# Patient Record
Sex: Male | Born: 1950 | Race: White | Hispanic: No | Marital: Married | Smoking: Former smoker
Health system: Southern US, Community
[De-identification: ages and names within clinical notes are randomized; demographics above are authoritative.]

## PROBLEM LIST (undated history)

## (undated) DIAGNOSIS — K219 Gastro-esophageal reflux disease without esophagitis: Secondary | ICD-10-CM

## (undated) DIAGNOSIS — F0390 Unspecified dementia without behavioral disturbance: Secondary | ICD-10-CM

## (undated) DIAGNOSIS — I82409 Acute embolism and thrombosis of unspecified deep veins of unspecified lower extremity: Secondary | ICD-10-CM

## (undated) DIAGNOSIS — M1990 Unspecified osteoarthritis, unspecified site: Secondary | ICD-10-CM

## (undated) DIAGNOSIS — G4730 Sleep apnea, unspecified: Secondary | ICD-10-CM

## (undated) DIAGNOSIS — R51 Headache: Secondary | ICD-10-CM

## (undated) DIAGNOSIS — I639 Cerebral infarction, unspecified: Secondary | ICD-10-CM

## (undated) DIAGNOSIS — R001 Bradycardia, unspecified: Secondary | ICD-10-CM

## (undated) DIAGNOSIS — C801 Malignant (primary) neoplasm, unspecified: Secondary | ICD-10-CM

## (undated) DIAGNOSIS — B1920 Unspecified viral hepatitis C without hepatic coma: Secondary | ICD-10-CM

## (undated) HISTORY — PX: APPENDECTOMY: SHX54

## (undated) HISTORY — DX: Sleep apnea, unspecified: G47.30

## (undated) HISTORY — DX: Gastro-esophageal reflux disease without esophagitis: K21.9

## (undated) HISTORY — PX: EYE SURGERY: SHX253

## (undated) HISTORY — DX: Unspecified viral hepatitis C without hepatic coma: B19.20

## (undated) HISTORY — PX: CATARACT EXTRACTION, BILATERAL: SHX1313

## (undated) HISTORY — DX: Cerebral infarction, unspecified: I63.9

## (undated) HISTORY — PX: OTHER SURGICAL HISTORY: SHX169

## (undated) HISTORY — DX: Headache: R51

---

## 1997-10-15 ENCOUNTER — Ambulatory Visit (HOSPITAL_COMMUNITY): Admission: RE | Admit: 1997-10-15 | Discharge: 1997-10-15 | Payer: Self-pay | Admitting: Gastroenterology

## 1999-12-23 ENCOUNTER — Encounter: Payer: Self-pay | Admitting: Family Medicine

## 1999-12-23 ENCOUNTER — Encounter: Admission: RE | Admit: 1999-12-23 | Discharge: 1999-12-23 | Payer: Self-pay | Admitting: Family Medicine

## 2000-05-04 ENCOUNTER — Emergency Department (HOSPITAL_COMMUNITY): Admission: EM | Admit: 2000-05-04 | Discharge: 2000-05-04 | Payer: Self-pay | Admitting: *Deleted

## 2000-08-21 ENCOUNTER — Ambulatory Visit (HOSPITAL_COMMUNITY): Admission: RE | Admit: 2000-08-21 | Discharge: 2000-08-21 | Payer: Self-pay | Admitting: Gastroenterology

## 2001-10-15 ENCOUNTER — Ambulatory Visit (HOSPITAL_COMMUNITY): Admission: RE | Admit: 2001-10-15 | Discharge: 2001-10-15 | Payer: Self-pay | Admitting: Gastroenterology

## 2001-10-15 ENCOUNTER — Encounter (INDEPENDENT_AMBULATORY_CARE_PROVIDER_SITE_OTHER): Payer: Self-pay | Admitting: Specialist

## 2002-12-30 ENCOUNTER — Encounter: Admission: RE | Admit: 2002-12-30 | Discharge: 2002-12-30 | Payer: Self-pay | Admitting: Family Medicine

## 2004-05-05 ENCOUNTER — Encounter: Admission: RE | Admit: 2004-05-05 | Discharge: 2004-05-05 | Payer: Self-pay | Admitting: Family Medicine

## 2004-08-29 ENCOUNTER — Encounter: Admission: RE | Admit: 2004-08-29 | Discharge: 2004-08-29 | Payer: Self-pay | Admitting: Family Medicine

## 2006-10-20 ENCOUNTER — Emergency Department (HOSPITAL_COMMUNITY): Admission: EM | Admit: 2006-10-20 | Discharge: 2006-10-20 | Payer: Self-pay | Admitting: Internal Medicine

## 2008-04-03 ENCOUNTER — Encounter: Admission: RE | Admit: 2008-04-03 | Discharge: 2008-04-03 | Payer: Self-pay | Admitting: Gastroenterology

## 2010-02-06 ENCOUNTER — Encounter: Payer: Self-pay | Admitting: Gastroenterology

## 2011-04-24 ENCOUNTER — Other Ambulatory Visit: Payer: Self-pay | Admitting: Gastroenterology

## 2011-04-24 DIAGNOSIS — B182 Chronic viral hepatitis C: Secondary | ICD-10-CM

## 2011-04-27 ENCOUNTER — Ambulatory Visit
Admission: RE | Admit: 2011-04-27 | Discharge: 2011-04-27 | Disposition: A | Discharge status: Home / Self Care | Payer: Federal, State, Local not specified - PPO | Source: Ambulatory Visit | Attending: Gastroenterology | Admitting: Gastroenterology

## 2011-04-27 DIAGNOSIS — B182 Chronic viral hepatitis C: Secondary | ICD-10-CM

## 2011-08-17 ENCOUNTER — Other Ambulatory Visit: Payer: Self-pay | Admitting: Otolaryngology

## 2012-11-14 ENCOUNTER — Other Ambulatory Visit: Payer: Self-pay | Admitting: Otolaryngology

## 2013-03-11 ENCOUNTER — Ambulatory Visit: Payer: Federal, State, Local not specified - PPO | Admitting: Neurology

## 2013-04-02 ENCOUNTER — Ambulatory Visit: Payer: Federal, State, Local not specified - PPO | Admitting: Neurology

## 2013-04-08 ENCOUNTER — Encounter: Payer: Self-pay | Admitting: Neurology

## 2013-04-08 ENCOUNTER — Ambulatory Visit (INDEPENDENT_AMBULATORY_CARE_PROVIDER_SITE_OTHER): Payer: Federal, State, Local not specified - PPO | Admitting: Neurology

## 2013-04-08 VITALS — BP 128/60 | HR 70 | Temp 98.0°F | Resp 16 | Ht 72.0 in | Wt 165.3 lb

## 2013-04-08 DIAGNOSIS — I69319 Unspecified symptoms and signs involving cognitive functions following cerebral infarction: Secondary | ICD-10-CM

## 2013-04-08 DIAGNOSIS — I69919 Unspecified symptoms and signs involving cognitive functions following unspecified cerebrovascular disease: Secondary | ICD-10-CM

## 2013-04-08 DIAGNOSIS — G3184 Mild cognitive impairment, so stated: Secondary | ICD-10-CM

## 2013-04-08 DIAGNOSIS — R413 Other amnesia: Secondary | ICD-10-CM

## 2013-04-08 NOTE — Patient Instructions (Addendum)
I think you probably do have mild cognitive impairment affecting memory.  I agree with taking the Aricept because there is a much higher risk of developing Alzheimer's.  I think the behavioral problems after the stroke are separate and may be related to the stroke.  In the meantime, continue the aspirin and Zocor.  The LDL (bad cholesterol) should be less than 100.  In the meantime, read, do crossword puzzles and brainteasers, and stay active and social.  Increase exercise.  We will re-evaluate in 6 months.

## 2013-04-08 NOTE — Progress Notes (Signed)
NEUROLOGY CONSULTATION NOTE  Brendan Holland MRN: [id number] DOB: [birth year]  Referring provider: Dr. Glennon Holland Primary care provider: Dr. Glennon Holland  Reason for consult:  Memory problems.  HISTORY OF PRESENT ILLNESS: Brendan Holland is a 63 year old left-handed man with history of alcohol abuse, heroin abuse, stroke, multiple concussions and OSA who presents for memory problems.  Records and images were personally reviewed where available.    In May 2011, he passed out and fell, hitting his head.  He was unconscious for maybe 5 to 7 minutes.  When he woke up, he was disoriented and combative.  CT of the head performed at that time was normal.  Since that time, he suddenly exhibited change in behavior and personality.  He became socially inappropriate.  He would make sexual innuendos to women.  He had an affair a few months after this episode.  He and his wife went for counseling to get through it.  He also is more easily prone to outbursts but he does not physically harm his wife.  He does not exhibit any obsessive compulsive behavior.  Over the past 2 or 3 years, he began to have a progressive steady decline in regards to memory and language.  He repeats questions often and misplaces items.  He even needs several cues from his wife to recall long term memory.  He has increased word-finding difficulties.  He will start to stammer and stutter more often when he speaks.  He understands what people say to him but it may take a little time for him to process everything.  He has had more problems performing typical tasks, such as putting together his power tools.  He is able to perform all his ADLs, but sometimes he doesn't pay attention to hygiene as well as in the past.  For example, he tends to go a day or two too many without shaving.  Or he may wear the same pair of jeans too many days in a row.  He does drive.  He has gotten disoriented on familiar routes before, although not often.  He is not an  aggressive driver and has not gotten into any accidents or near-accidents.  His wife says that she feels safe when she rides with him.  He does not have hallucinations or delusions.  He does feel a little depressed.  He does feel fatigued during the day.  He has OSA but does not use a CPAP machine because the masks don't fit his face well.  He tries to stay active.  He runs 3 to 4 days a week, but used to run 5 days a week.  He goes to [employer] about 4 times a week.  He saw a neurologist, Dr. Trula Holland, in October 2014.  An MRI of the brain was performed on 10/21/12, which revealed a tiny area of encephalomalacia in the parasagittal region of the right frontal lobe.  It was suggested that the syncopal episode in 2011 was the stroke.  He underwent a stroke workup.  LDL was 127.  Carotid dopplers and transcranial doppler were unremarkable.  He was advised to start a statin.  He was already on ASA.  He did have a B12 level checked on 10/15/12, which was 695.  However, a repeat level was checked on 11/07/12, which was 181.  It was thought that the first result was a mistake.  He was started on B12 shots.  For about two weeks, his wife said he seemed like his old  self, but this soon passed.  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  His PCP, Dr. Glennon Holland, started him on Aricept in January 2015.  There is no known family history of dementia.  12/30/02 MRI BRAIN WO:  Normal (personally reviewed) 05/26/09 CT HEAD: normal (personally reviewed) 10/21/12 MRI BRAIN W/WO:  tiny focus of encephalomalacia in parasagittal right frontal lobe (personally reviewed) 11/19/12 Transcranial Doppler:  normal. 11/19/12 Carotid Duplex:  Mild to moderate plaque bilaterally.  No hemodynamically significant stenosis. 02/14/13 LABS:  B12 1756 10/15/12 LABS:  B12 695, folate >20 11/07/12 LABS:  LDL 127, TSH  2.988, B12 181, folate >20, B1 12, B6 58.1 11/20/11 LABS:  LDL 78, TSH 2.620  PAST MEDICAL HISTORY: Past Medical History  Diagnosis Date  . Headache(784.0)   . Hepatitis C   . GERD (gastroesophageal reflux disease)   . Sleep apnea   . Stroke     PAST SURGICAL HISTORY: Past Surgical History  Procedure Laterality Date  . Knee rt    . Lt shoulder    . Rt finger    . Eye surgery    . Appendectomy      MEDICATIONS: ASA 81mg  daily Aricept 5mg  daily Prvacid 15mg  daily Simvastatin 10mg  daily  ALLERGIES: NA  FAMILY HISTORY: Family History  Problem Relation Age of Onset  . Diabetes Father   . Heart failure Mother   . Stroke Brother     SOCIAL HISTORY: History   Social History  . Marital Status: Married    Spouse Name: N/A    Number of Children: N/A  . Years of Education: N/A   Occupational History  . Not on file.   Social History Main Topics  . Smoking status: Former Research scientist (life sciences)  . Smokeless tobacco: Never Used  . Alcohol Use: No  . Drug Use: No  . Sexual Activity: Yes    Partners: Female   Other Topics Concern  . Not on file   Social History Narrative  . No narrative on file    REVIEW OF SYSTEMS: Constitutional: No fevers, chills, or sweats, no generalized fatigue, change in appetite Eyes: No visual changes, double vision, eye pain Ear, nose and throat: No hearing loss, ear pain, nasal congestion, sore throat Cardiovascular: No chest pain, palpitations Respiratory:  No shortness of breath at rest or with exertion, wheezes GastrointestinaI: No nausea, vomiting, diarrhea, abdominal pain, fecal incontinence Genitourinary:  No dysuria, urinary retention or frequency Musculoskeletal:  No neck pain, back pain Integumentary: No rash, pruritus, skin lesions Neurological: as above Psychiatric: No depression, insomnia, anxiety Endocrine: No palpitations, fatigue, diaphoresis, mood swings, change in appetite, change in weight, increased  thirst Hematologic/Lymphatic:  No anemia, purpura, petechiae. Allergic/Immunologic: no itchy/runny eyes, nasal congestion, recent allergic reactions, rashes  PHYSICAL EXAM: Filed Vitals:   04/08/13 1342  BP: 128/60  Pulse: 70  Temp: 98 F (36.7 C)  Resp: 16   General: No acute distress Head:  Normocephalic/atraumatic Neck: supple, no paraspinal tenderness, full range of motion Back: No paraspinal tenderness Heart: regular rate and rhythm Lungs: Clear to auscultation bilaterally. Vascular: No carotid bruits. Neurological Exam: Mental status: alert and oriented to person, place, and time (except date), recent and remote memory intact, fund of knowledge intact, attention and concentration intact, speech fluent and not dysarthric, language intact.  Able to draw Trail Making Test, copy a cube and draw a clock correctly.  Naming fluency correct.  Serial 7 subtraction intact (missed one by one digit).  Unable to recall 5 of 5 words.  Abstraction intact.  MOCA 24/30 Cranial nerves: CN I: not tested CN II: pupils equal, round and reactive to light, visual fields intact, fundi unremarkable, without vessel changes, exudates, hemorrhages or papilledema. CN III, IV, VI:  full range of motion, no nystagmus, no ptosis CN V: facial sensation intact CN VII: upper and lower face symmetric CN VIII: hearing intact CN IX, X: gag intact, uvula midline CN XI: sternocleidomastoid and trapezius muscles intact CN XII: tongue midline Bulk & Tone: normal, no fasciculations. Motor: 5/5 throughout Sensation: temperature and vibration intact. Deep Tendon Reflexes: 2+ throughout, toes down Finger to nose testing: no tremor or dysmetria Heel to shin: no dysmetria Gait: normal station and stride.  Able to turn and walk in tandem. Romberg negative.  IMPRESSION: Constellation of symptoms may be multifactorial. 1.  Amnestic mild cognitive impairment, given the progression of memory problems 2.  Stroke.  The  sudden onset of behavioral symptoms and disinhibition may possibly be due to a frontal lobe stroke, given that these symptoms were sudden onset and following the episode of syncope.  Such behavior, as well as language problems and executive dysfunction, may also suggest frontotemporal dementia, however that wouldn't come on suddenly and it wouldn't explain the memory problems  PLAN: 1.  I would increase Aricept to 10mg  daily 2.  Continue the ASA 81mg  daily and statin (LDL goal should be less than 100). 3.  Advised to continue exercising and remain active and social.  Perform brainteasers and puzzles.  Read. 4.  Follow up in 6 months or as needed.  60 minutes spent with patient, over 50% spent reviewing outside chart, images, counseling and coordinating care.  Thank you for allowing me to take part in the care of this patient.  Metta Clines, DO  CC:  [name], MD

## 2013-04-09 ENCOUNTER — Other Ambulatory Visit: Payer: Self-pay | Admitting: *Deleted

## 2013-04-09 ENCOUNTER — Telehealth: Payer: Self-pay | Admitting: *Deleted

## 2013-04-09 NOTE — Telephone Encounter (Signed)
Aricept 10 mg 1 at bedtime # 30 called in to [facility]  [place] with 3 refills  Patient is aware

## 2013-05-16 ENCOUNTER — Encounter: Payer: Self-pay | Admitting: Neurology

## 2013-07-08 ENCOUNTER — Other Ambulatory Visit: Payer: Self-pay | Admitting: *Deleted

## 2013-07-08 NOTE — Telephone Encounter (Signed)
Donepezil 10 mg 1 PO at HS # 30 with 2 refills  Called to [facility]  [place]

## 2013-09-30 ENCOUNTER — Telehealth: Payer: Self-pay | Admitting: Neurology

## 2013-09-30 NOTE — Telephone Encounter (Signed)
Pt called to cancel his f/u on 10/07/13 due to him seeing another provider.

## 2013-10-07 ENCOUNTER — Ambulatory Visit: Payer: Federal, State, Local not specified - PPO | Admitting: Neurology

## 2013-10-13 ENCOUNTER — Other Ambulatory Visit: Payer: Self-pay | Admitting: *Deleted

## 2013-10-13 DIAGNOSIS — G309 Alzheimer's disease, unspecified: Principal | ICD-10-CM

## 2013-10-13 DIAGNOSIS — F0280 Dementia in other diseases classified elsewhere without behavioral disturbance: Secondary | ICD-10-CM

## 2013-10-13 MED ORDER — DONEPEZIL HCL 10 MG PO TABS: 10.0000 mg | ORAL_TABLET | Freq: Every day | ORAL | Status: DC

## 2013-10-31 ENCOUNTER — Other Ambulatory Visit: Payer: Self-pay

## 2014-02-19 ENCOUNTER — Telehealth: Payer: Self-pay | Admitting: *Deleted

## 2014-02-19 NOTE — Telephone Encounter (Signed)
DONEPEZIL HCL 10 MG TABLET # 90 with 1 refill called to pharmacy

## 2014-04-10 ENCOUNTER — Other Ambulatory Visit: Payer: Self-pay | Admitting: Neurology

## 2014-04-14 ENCOUNTER — Other Ambulatory Visit: Payer: Self-pay | Admitting: Neurology

## 2014-07-13 ENCOUNTER — Other Ambulatory Visit: Payer: Self-pay

## 2014-07-16 ENCOUNTER — Other Ambulatory Visit: Payer: Self-pay | Admitting: Gastroenterology

## 2014-07-16 DIAGNOSIS — B1920 Unspecified viral hepatitis C without hepatic coma: Secondary | ICD-10-CM

## 2014-07-16 DIAGNOSIS — C220 Liver cell carcinoma: Secondary | ICD-10-CM

## 2014-07-27 ENCOUNTER — Ambulatory Visit
Admission: RE | Admit: 2014-07-27 | Discharge: 2014-07-27 | Disposition: A | Discharge status: Home / Self Care | Payer: Federal, State, Local not specified - PPO | Source: Ambulatory Visit | Attending: Gastroenterology | Admitting: Gastroenterology

## 2014-07-27 DIAGNOSIS — B1920 Unspecified viral hepatitis C without hepatic coma: Secondary | ICD-10-CM

## 2014-07-27 DIAGNOSIS — C220 Liver cell carcinoma: Secondary | ICD-10-CM

## 2014-10-23 ENCOUNTER — Other Ambulatory Visit: Payer: Self-pay | Admitting: Neurology

## 2014-10-23 NOTE — Telephone Encounter (Signed)
I have not seen this patient in a year and a half.  He should get refills from his PCP until he is able to follow up with me.

## 2014-11-16 ENCOUNTER — Other Ambulatory Visit: Payer: Self-pay | Admitting: Neurology

## 2014-11-16 DIAGNOSIS — G3184 Mild cognitive impairment, so stated: Secondary | ICD-10-CM

## 2015-08-05 ENCOUNTER — Other Ambulatory Visit: Payer: Self-pay | Admitting: Neurology

## 2015-08-05 NOTE — Telephone Encounter (Signed)
Rx refused.  Patient needs an appointment.

## 2015-09-09 ENCOUNTER — Ambulatory Visit: Payer: Medicare Other | Admitting: Sports Medicine

## 2015-10-14 ENCOUNTER — Other Ambulatory Visit: Payer: Self-pay | Admitting: Gastroenterology

## 2015-10-14 DIAGNOSIS — B1920 Unspecified viral hepatitis C without hepatic coma: Secondary | ICD-10-CM

## 2015-11-01 ENCOUNTER — Ambulatory Visit
Admission: RE | Admit: 2015-11-01 | Discharge: 2015-11-01 | Disposition: A | Discharge status: Home / Self Care | Payer: Medicare Other | Source: Ambulatory Visit | Attending: Gastroenterology | Admitting: Gastroenterology

## 2015-11-01 DIAGNOSIS — B1920 Unspecified viral hepatitis C without hepatic coma: Secondary | ICD-10-CM

## 2015-11-12 ENCOUNTER — Ambulatory Visit: Payer: Self-pay | Admitting: Surgery

## 2015-11-12 NOTE — H&P (Signed)
Brendan Holland 11/12/2015 3:44 PM Location: [facility] Patient #: [id number] DOB: Feb 18, 1950 Married / Language: Brendan Holland / Race: White Male  History of Present Illness (Zarayah Lanting A. Kae Heller MD; 11/12/2015 3:59 PM) Patient words: This is a very pleasant 65yo man who is here today with his wife for evaluation for laparoscopic cholecystectomy. He has a history of HCV (treated, undetectable) and has undergone annual [place] surveillance. He has not had any liver lesions; last liver bx was 2008 with G2/stage 1 fibrosis. He did have stones up to 1.9cm as of 2016, but on this year's [place] there is a solid mass with incr color flow on doppler, measruing 1.7x1.3cm and concerning for neoplasm. CBD 73mm. He is completely asymptomatic- no nausea, vomiting, change in appetite, weight loss, change in bowel function, denies melena/hematochezia or abdominal pain. No shortness of breath or chest pain. Recent labs are unremarkable (WBC 7.2, hgb 14.4, plt 130, cr 1.09, bicarb 25, na/k/cl normal, tbili 1.1, ast/alt 29/21, alkphos 50, alb 4.2, AFP 3.1). Last c-scope normal, 2012. Last EGD 2011- negative.  In fact, he runs about 7 miles four times per week (at a minimum, per his wife). No tobacco or Etoh/drug use currently but remote hx of etoh abuse and heroine addiction. Veteran, former Tour manager.  The patient is a 65 year old male.   Other Problems Nance Pear, [place]; 11/12/2015 3:44 PM) Cerebrovascular Accident Cholelithiasis Cirrhosis Of Liver Gastroesophageal Reflux Disease Sleep Apnea  Past Surgical History Nance Pear, [place]; 11/12/2015 3:44 PM) Appendectomy Knee Surgery Right. Shoulder Surgery Right.  Diagnostic Studies History Nance Pear, [place]; 11/12/2015 3:44 PM) Colonoscopy 1-5 years ago  Allergies Nance Pear, [place]; 11/12/2015 3:45 PM) No Known Drug Allergies 11/12/2015  Medication History Nance Pear, [place]; 11/12/2015 3:47 PM) Donepezil HCl (10MG  Tablet, Oral)  Active. Fluad (0.5ML Susp Pref Syr, Intramuscular) Active. RaNITidine HCl (150MG  Tablet, Oral) Active. Simvastatin (20MG  Tablet, Oral) Active. Aspirin (81MG  Tablet DR, Oral) Active. Medications Reconciled  Social History Nance Pear, [place]; 11/12/2015 3:44 PM) Alcohol use Remotely quit alcohol use. Caffeine use Coffee, Tea. Illicit drug use Remotely quit drug use. Tobacco use Former smoker.  Family History Nance Pear, [place]; 11/12/2015 3:44 PM) Alcohol Abuse Brother, Daughter, Father. Diabetes Mellitus Father. Heart Disease Mother. Hypertension Mother. Respiratory Condition Mother.     Review of Systems Nance Pear CMA; 11/12/2015 3:44 PM) General Not Present- Appetite Loss, Chills, Fatigue, Fever, Night Sweats, Weight Gain and Weight Loss. Skin Not Present- Change in Wart/Mole, Dryness, Hives, Jaundice, New Lesions, Non-Healing Wounds, Rash and Ulcer. HEENT Not Present- Earache, Hearing Loss, Hoarseness, Nose Bleed, Oral Ulcers, Ringing in the Ears, Seasonal Allergies, Sinus Pain, Sore Throat, Visual Disturbances, Wears glasses/contact lenses and Yellow Eyes. Respiratory Not Present- Bloody sputum, Chronic Cough, Difficulty Breathing, Snoring and Wheezing. Cardiovascular Present- Leg Cramps. Not Present- Chest Pain, Difficulty Breathing Lying Down, Palpitations, Rapid Heart Rate, Shortness of Breath and Swelling of Extremities. Gastrointestinal Not Present- Abdominal Pain, Bloating, Bloody Stool, Change in Bowel Habits, Chronic diarrhea, Constipation, Difficulty Swallowing, Excessive gas, Gets full quickly at meals, Hemorrhoids, Indigestion, Nausea, Rectal Pain and Vomiting. Male Genitourinary Not Present- Blood in Urine, Change in Urinary Stream, Frequency, Impotence, Nocturia, Painful Urination, Urgency and Urine Leakage.  Vitals Bary Castilla Bradford CMA; 11/12/2015 3:48 PM) 11/12/2015 3:47 PM Weight: 165 lb Height: 72in Body Surface Area: 1.96 m Body Mass  Index: 22.38 kg/m  Temp.: 98.5F  Pulse: 82 (Regular)  BP: 122/92 (Sitting, Left Arm, Standard)      Physical Exam (Czar Ysaguirre A. Kae Heller  MD; 11/12/2015 4:00 PM)  General Mental Status-Alert. General Appearance-Consistent with stated age. Hydration-Well hydrated. Voice-Normal.  Head and Neck Head-normocephalic, atraumatic with no lesions or palpable masses. Trachea-midline. Thyroid Gland Characteristics - normal size and consistency.  Eye Eyeball - Bilateral-Extraocular movements intact. Sclera/Conjunctiva - Bilateral-No scleral icterus.  Chest and Lung Exam Chest and lung exam reveals -quiet, even and easy respiratory effort with no use of accessory muscles and on auscultation, normal breath sounds, no adventitious sounds and normal vocal resonance. Inspection Chest Wall - Normal. Back - normal.  Cardiovascular Cardiovascular examination reveals -normal heart sounds, regular rate and rhythm with no murmurs and normal pedal pulses bilaterally.  Abdomen Inspection Inspection of the abdomen reveals - No Hernias. Palpation/Percussion Palpation and Percussion of the abdomen reveal - Soft, Non Tender, No Rebound tenderness, No Rigidity (guarding) and No hepatosplenomegaly. Auscultation Auscultation of the abdomen reveals - Bowel sounds normal.  Neurologic Neurologic evaluation reveals -alert and oriented x 3 with no impairment of recent or remote memory. Mental Status-Normal.  Musculoskeletal Global Assessment -Note:no gross deformities.  Normal Exam - Left-Upper Extremity Strength Normal and Lower Extremity Strength Normal. Normal Exam - Right-Upper Extremity Strength Normal and Lower Extremity Strength Normal.  Lymphatic Head & Neck  General Head & Neck Lymphatics: Bilateral - Description - Normal. Axillary  General Axillary Region: Bilateral - Description - Normal. Tenderness - Non Tender. Femoral & Inguinal  Generalized  Femoral & Inguinal Lymphatics: Bilateral - Description - No Generalized lymphadenopathy.    Assessment & Plan (Edie Darley A. Kae Heller MD; 11/12/2015 4:02 PM)  GALLBLADDER MASS (Principal Diagnosis) (K82.8) Story: Asymptomatic but given size it needs cholecystectomy for evaluation for malignancy. Will plan laparoscopic cholecystectomy, possible liver biopsy. We discussed the risks/benefits, specifically pain, bleeding, scarring, infection, injury to adjacent structures specifically the common bile duct and sequelae of that complication. We discussed the possibility that malignancy may be found in depending on the depths, he may require further surgical intervention by hepatobiliary surgeon. He is scheduled for colonoscopy on November 7, and we'll plan to schedule his gallbladder surgery after this.

## 2015-11-18 ENCOUNTER — Other Ambulatory Visit: Payer: Self-pay | Admitting: Surgery

## 2015-11-18 DIAGNOSIS — K828 Other specified diseases of gallbladder: Secondary | ICD-10-CM

## 2015-11-29 ENCOUNTER — Ambulatory Visit
Admission: RE | Admit: 2015-11-29 | Discharge: 2015-11-29 | Disposition: A | Discharge status: Home / Self Care | Payer: Medicare Other | Source: Ambulatory Visit | Attending: Surgery | Admitting: Surgery

## 2015-11-29 DIAGNOSIS — K828 Other specified diseases of gallbladder: Secondary | ICD-10-CM

## 2015-11-29 MED ORDER — GADOBENATE DIMEGLUMINE 529 MG/ML IV SOLN
15.0000 mL | Freq: Once | INTRAVENOUS | Status: AC | PRN
  Administered 2015-11-29: 15 mL via INTRAVENOUS

## 2015-12-20 ENCOUNTER — Encounter (HOSPITAL_COMMUNITY): Payer: Self-pay

## 2015-12-20 ENCOUNTER — Encounter (HOSPITAL_COMMUNITY)
Admission: RE | Admit: 2015-12-20 | Discharge: 2015-12-20 | Disposition: A | Discharge status: Home / Self Care | Payer: Medicare Other | Source: Ambulatory Visit | Attending: Surgery | Admitting: Surgery

## 2015-12-20 ENCOUNTER — Ambulatory Visit (HOSPITAL_COMMUNITY)
Admission: RE | Admit: 2015-12-20 | Discharge: 2015-12-20 | Disposition: A | Discharge status: Home / Self Care | Payer: Medicare Other | Source: Ambulatory Visit | Attending: Surgery | Admitting: Surgery

## 2015-12-20 DIAGNOSIS — Z01810 Encounter for preprocedural cardiovascular examination: Secondary | ICD-10-CM | POA: Insufficient documentation

## 2015-12-20 DIAGNOSIS — Z01818 Encounter for other preprocedural examination: Secondary | ICD-10-CM

## 2015-12-20 DIAGNOSIS — J449 Chronic obstructive pulmonary disease, unspecified: Secondary | ICD-10-CM | POA: Insufficient documentation

## 2015-12-20 DIAGNOSIS — Z01812 Encounter for preprocedural laboratory examination: Secondary | ICD-10-CM | POA: Diagnosis present

## 2015-12-20 HISTORY — DX: Unspecified dementia, unspecified severity, without behavioral disturbance, psychotic disturbance, mood disturbance, and anxiety: F03.90

## 2015-12-20 HISTORY — DX: Malignant (primary) neoplasm, unspecified: C80.1

## 2015-12-20 HISTORY — DX: Bradycardia, unspecified: R00.1

## 2015-12-20 HISTORY — DX: Unspecified osteoarthritis, unspecified site: M19.90

## 2015-12-20 LAB — COMPREHENSIVE METABOLIC PANEL
ALBUMIN: 4.3 g/dL (ref 3.5–5.0)
ALK PHOS: 50 U/L (ref 38–126)
ALT: 22 U/L (ref 17–63)
ANION GAP: 5 (ref 5–15)
AST: 29 U/L (ref 15–41)
BUN: 15 mg/dL (ref 6–20)
CO2: 31 mmol/L (ref 22–32)
Calcium: 9.2 mg/dL (ref 8.9–10.3)
Chloride: 103 mmol/L (ref 101–111)
Creatinine, Ser: 0.97 mg/dL (ref 0.61–1.24)
GFR calc Af Amer: >60 mL/min (ref >60)
GFR calc non Af Amer: >60 mL/min (ref >60)
GLUCOSE: 100 mg/dL — AB (ref 65–99)
POTASSIUM: 4.4 mmol/L (ref 3.5–5.1)
SODIUM: 139 mmol/L (ref 135–145)
Total Bilirubin: 1.3 mg/dL — ABNORMAL HIGH (ref 0.3–1.2)
Total Protein: 7.3 g/dL (ref 6.5–8.1)

## 2015-12-20 LAB — CBC
HEMATOCRIT: 41.4 % (ref 39.0–52.0)
HEMOGLOBIN: 14.4 g/dL (ref 13.0–17.0)
MCH: 33 pg (ref 26.0–34.0)
MCHC: 34.8 g/dL (ref 30.0–36.0)
MCV: 95 fL (ref 78.0–100.0)
Platelets: 128 10*3/uL — ABNORMAL LOW (ref 150–400)
RBC: 4.36 MIL/uL (ref 4.22–5.81)
RDW: 12.4 % (ref 11.5–15.5)
WBC: 7.4 10*3/uL (ref 4.0–10.5)

## 2015-12-20 NOTE — Patient Instructions (Addendum)
Brendan Holland  12/20/2015   Your procedure is scheduled on: 12-24-15  Report to [facility]  Entrance take [facility]  elevators to 3rd floor to  [place] at  0800  AM.  Call this number if you have problems the morning of surgery [phone]   Remember: ONLY 1 PERSON MAY GO WITH YOU TO SHORT STAY TO GET  READY MORNING OF [name].  Do not eat food or drink liquids :After Midnight.     Take these medicines the morning of surgery with A SIP OF WATER: Ranitidine. DO NOT TAKE ANY DIABETIC MEDICATIONS DAY OF YOUR SURGERY                               You may not have any metal on your body including hair pins and              piercings  Do not wear jewelry, make-up, lotions, powders or perfumes, deodorant             Do not wear nail polish.  Do not shave  48 hours prior to surgery.              Men may shave face and neck.   Do not bring valuables to the hospital. [name].  Contacts, dentures or bridgework may not be worn into surgery.  Leave suitcase in the car. After surgery it may be brought to your room.     Patients discharged the day of surgery will not be allowed to drive home.  Name and phone number of your driver:[name] [phone] cell  Special Instructions: N/A              Please read over the following fact sheets you were given: _____________________________________________________________________             [facility] - Preparing for Surgery Before surgery, you can play an important role.  Because skin is not sterile, your skin needs to be as free of germs as possible.  You can reduce the number of germs on your skin by washing with CHG (chlorahexidine gluconate) soap before surgery.  CHG is an antiseptic cleaner which kills germs and bonds with the skin to continue killing germs even after washing. Please DO NOT use if you have an allergy to CHG or antibacterial soaps.   If your skin becomes reddened/irritated stop using the CHG and inform your nurse when you arrive at Short Stay. Do not shave (including legs and underarms) for at least 48 hours prior to the first CHG shower.  You may shave your face/neck. Please follow these instructions carefully:  1.  Shower with CHG Soap the night before surgery and the  morning of Surgery.  2.  If you choose to wash your hair, wash your hair first as usual with your  normal  shampoo.  3.  After you shampoo, rinse your hair and body thoroughly to remove the  shampoo.                           4.  Use CHG as you would any other liquid soap.  You can apply chg directly  to the  skin and wash                       Gently with a scrungie or clean washcloth.  5.  Apply the CHG Soap to your body ONLY FROM THE NECK DOWN.   Do not use on face/ open                           Wound or open sores. Avoid contact with eyes, ears mouth and genitals (private parts).                       Wash face,  Genitals (private parts) with your normal soap.             6.  Wash thoroughly, paying special attention to the area where your surgery  will be performed.  7.  Thoroughly rinse your body with warm water from the neck down.  8.  DO NOT shower/wash with your normal soap after using and rinsing off  the CHG Soap.                9.  Pat yourself dry with a clean towel.            10.  Wear clean pajamas.            11.  Place clean sheets on your bed the night of your first shower and do not  sleep with pets. Day of Surgery : Do not apply any lotions/deodorants the morning of surgery.  Please wear clean clothes to the hospital/surgery center.  FAILURE TO FOLLOW THESE INSTRUCTIONS MAY RESULT IN THE CANCELLATION OF YOUR SURGERY PATIENT SIGNATURE_________________________________  NURSE SIGNATURE__________________________________  ________________________________________________________________________

## 2015-12-20 NOTE — Pre-Procedure Instructions (Signed)
EKG, CXR done per MD order.

## 2015-12-24 ENCOUNTER — Ambulatory Visit (HOSPITAL_COMMUNITY)
Admission: RE | Admit: 2015-12-24 | Discharge: 2015-12-24 | Disposition: A | Discharge status: Home / Self Care | Payer: Medicare Other | Source: Ambulatory Visit | Attending: Surgery | Admitting: Surgery

## 2015-12-24 ENCOUNTER — Encounter (HOSPITAL_COMMUNITY)
Admission: RE | Disposition: A | Discharge status: Home / Self Care | Payer: Self-pay | Source: Ambulatory Visit | Attending: Surgery

## 2015-12-24 ENCOUNTER — Encounter (HOSPITAL_COMMUNITY): Payer: Self-pay | Admitting: *Deleted

## 2015-12-24 ENCOUNTER — Ambulatory Visit (HOSPITAL_COMMUNITY): Payer: Medicare Other | Admitting: Anesthesiology

## 2015-12-24 DIAGNOSIS — M1990 Unspecified osteoarthritis, unspecified site: Secondary | ICD-10-CM | POA: Insufficient documentation

## 2015-12-24 DIAGNOSIS — K8010 Calculus of gallbladder with chronic cholecystitis without obstruction: Secondary | ICD-10-CM | POA: Diagnosis not present

## 2015-12-24 DIAGNOSIS — G4730 Sleep apnea, unspecified: Secondary | ICD-10-CM | POA: Insufficient documentation

## 2015-12-24 DIAGNOSIS — B1920 Unspecified viral hepatitis C without hepatic coma: Secondary | ICD-10-CM | POA: Diagnosis not present

## 2015-12-24 DIAGNOSIS — K219 Gastro-esophageal reflux disease without esophagitis: Secondary | ICD-10-CM | POA: Insufficient documentation

## 2015-12-24 DIAGNOSIS — Z87891 Personal history of nicotine dependence: Secondary | ICD-10-CM | POA: Insufficient documentation

## 2015-12-24 DIAGNOSIS — K8020 Calculus of gallbladder without cholecystitis without obstruction: Secondary | ICD-10-CM | POA: Diagnosis present

## 2015-12-24 DIAGNOSIS — Z7982 Long term (current) use of aspirin: Secondary | ICD-10-CM | POA: Diagnosis not present

## 2015-12-24 DIAGNOSIS — K7460 Unspecified cirrhosis of liver: Secondary | ICD-10-CM | POA: Insufficient documentation

## 2015-12-24 DIAGNOSIS — Z8673 Personal history of transient ischemic attack (TIA), and cerebral infarction without residual deficits: Secondary | ICD-10-CM | POA: Diagnosis not present

## 2015-12-24 HISTORY — PX: CHOLECYSTECTOMY: SHX55

## 2015-12-24 SURGERY — LAPAROSCOPIC CHOLECYSTECTOMY
Anesthesia: General | Site: Abdomen

## 2015-12-24 MED ORDER — CEFAZOLIN SODIUM-DEXTROSE 2-4 GM/100ML-% IV SOLN
2.0000 g | INTRAVENOUS | Status: AC
  Administered 2015-12-24: 2 g via INTRAVENOUS

## 2015-12-24 MED ORDER — HYDROCODONE-ACETAMINOPHEN 5-325 MG PO TABS: 1.0000 | ORAL_TABLET | Freq: Four times a day (QID) | ORAL | 0 refills | Status: DC | PRN

## 2015-12-24 MED ORDER — OXYCODONE HCL 5 MG PO TABS
5.0000 mg | ORAL_TABLET | ORAL | Status: DC | PRN
  Administered 2015-12-24 (×2): 5 mg via ORAL
  Filled 2015-12-24 (×2): qty 1

## 2015-12-24 MED ORDER — HYDROMORPHONE HCL 1 MG/ML IJ SOLN
0.2500 mg | INTRAMUSCULAR | Status: DC | PRN
  Administered 2015-12-24 (×2): 0.5 mg via INTRAVENOUS

## 2015-12-24 MED ORDER — LIDOCAINE HCL (CARDIAC) 20 MG/ML IV SOLN
INTRAVENOUS | Status: DC | PRN
  Administered 2015-12-24: 50 mg via INTRAVENOUS

## 2015-12-24 MED ORDER — ROCURONIUM BROMIDE 100 MG/10ML IV SOLN
INTRAVENOUS | Status: DC | PRN
  Administered 2015-12-24 (×3): 10 mg via INTRAVENOUS
  Administered 2015-12-24: 40 mg via INTRAVENOUS
  Administered 2015-12-24: 20 mg via INTRAVENOUS

## 2015-12-24 MED ORDER — SUGAMMADEX SODIUM 200 MG/2ML IV SOLN
INTRAVENOUS | Status: AC
  Filled 2015-12-24: qty 2

## 2015-12-24 MED ORDER — FENTANYL CITRATE (PF) 100 MCG/2ML IJ SOLN
INTRAMUSCULAR | Status: AC
  Filled 2015-12-24: qty 2

## 2015-12-24 MED ORDER — CEFAZOLIN SODIUM-DEXTROSE 2-4 GM/100ML-% IV SOLN
INTRAVENOUS | Status: AC
  Filled 2015-12-24: qty 100

## 2015-12-24 MED ORDER — FENTANYL CITRATE (PF) 100 MCG/2ML IJ SOLN: 25.0000 ug | INTRAMUSCULAR | Status: DC | PRN

## 2015-12-24 MED ORDER — GLYCOPYRROLATE 0.2 MG/ML IV SOSY
PREFILLED_SYRINGE | INTRAVENOUS | Status: AC
  Filled 2015-12-24: qty 3

## 2015-12-24 MED ORDER — SODIUM CHLORIDE 0.9 % IV SOLN: 250.0000 mL | INTRAVENOUS | Status: DC | PRN

## 2015-12-24 MED ORDER — ROCURONIUM BROMIDE 50 MG/5ML IV SOSY
PREFILLED_SYRINGE | INTRAVENOUS | Status: AC
Start: 2015-12-24 — End: 2015-12-24
  Filled 2015-12-24: qty 5

## 2015-12-24 MED ORDER — CEFAZOLIN SODIUM-DEXTROSE 2-4 GM/100ML-% IV SOLN: 2.0000 g | INTRAVENOUS | Status: DC

## 2015-12-24 MED ORDER — ONDANSETRON HCL 4 MG/2ML IJ SOLN
INTRAMUSCULAR | Status: AC
Start: 2015-12-24 — End: 2015-12-24
  Filled 2015-12-24: qty 2

## 2015-12-24 MED ORDER — GLYCOPYRROLATE 0.2 MG/ML IJ SOLN
INTRAMUSCULAR | Status: DC | PRN
  Administered 2015-12-24: 0.2 mg via INTRAVENOUS
  Administered 2015-12-24: 0.1 mg via INTRAVENOUS

## 2015-12-24 MED ORDER — MIDAZOLAM HCL 2 MG/2ML IJ SOLN
INTRAMUSCULAR | Status: AC
  Filled 2015-12-24: qty 2

## 2015-12-24 MED ORDER — DOCUSATE SODIUM 100 MG PO CAPS: 100.0000 mg | ORAL_CAPSULE | Freq: Two times a day (BID) | ORAL | 0 refills | Status: AC

## 2015-12-24 MED ORDER — CHLORHEXIDINE GLUCONATE CLOTH 2 % EX PADS: 6.0000 | MEDICATED_PAD | Freq: Once | CUTANEOUS | Status: DC

## 2015-12-24 MED ORDER — ROCURONIUM BROMIDE 50 MG/5ML IV SOSY
PREFILLED_SYRINGE | INTRAVENOUS | Status: AC
  Filled 2015-12-24: qty 5

## 2015-12-24 MED ORDER — MIDAZOLAM HCL 5 MG/5ML IJ SOLN
INTRAMUSCULAR | Status: DC | PRN
  Administered 2015-12-24: 2 mg via INTRAVENOUS

## 2015-12-24 MED ORDER — 0.9 % SODIUM CHLORIDE (POUR BTL) OPTIME
TOPICAL | Status: DC | PRN
  Administered 2015-12-24: 1000 mL

## 2015-12-24 MED ORDER — BUPIVACAINE HCL (PF) 0.25 % IJ SOLN
INTRAMUSCULAR | Status: DC | PRN
  Administered 2015-12-24: 16 mL

## 2015-12-24 MED ORDER — OXYCODONE HCL 5 MG PO TABS: 5.0000 mg | ORAL_TABLET | Freq: Once | ORAL | Status: DC | PRN

## 2015-12-24 MED ORDER — SUGAMMADEX SODIUM 200 MG/2ML IV SOLN
INTRAVENOUS | Status: DC | PRN
  Administered 2015-12-24: 200 mg via INTRAVENOUS

## 2015-12-24 MED ORDER — ACETAMINOPHEN 650 MG RE SUPP
650.0000 mg | RECTAL | Status: DC | PRN
  Filled 2015-12-24: qty 1

## 2015-12-24 MED ORDER — LACTATED RINGERS IR SOLN
Status: DC | PRN
  Administered 2015-12-24: 3000 mL

## 2015-12-24 MED ORDER — SODIUM CHLORIDE 0.9% FLUSH: 3.0000 mL | INTRAVENOUS | Status: DC | PRN

## 2015-12-24 MED ORDER — ACETAMINOPHEN 325 MG PO TABS: 650.0000 mg | ORAL_TABLET | ORAL | Status: DC | PRN

## 2015-12-24 MED ORDER — ONDANSETRON HCL 4 MG/2ML IJ SOLN
INTRAMUSCULAR | Status: DC | PRN
  Administered 2015-12-24: 4 mg via INTRAVENOUS

## 2015-12-24 MED ORDER — OXYCODONE HCL 5 MG/5ML PO SOLN
5.0000 mg | Freq: Once | ORAL | Status: DC | PRN
  Filled 2015-12-24: qty 5

## 2015-12-24 MED ORDER — BUPIVACAINE HCL (PF) 0.25 % IJ SOLN
INTRAMUSCULAR | Status: AC
  Filled 2015-12-24: qty 30

## 2015-12-24 MED ORDER — EPHEDRINE SULFATE 50 MG/ML IJ SOLN
INTRAMUSCULAR | Status: DC | PRN
  Administered 2015-12-24: 10 mg via INTRAVENOUS

## 2015-12-24 MED ORDER — FENTANYL CITRATE (PF) 100 MCG/2ML IJ SOLN
INTRAMUSCULAR | Status: DC | PRN
  Administered 2015-12-24: 50 ug via INTRAVENOUS
  Administered 2015-12-24 (×3): 25 ug via INTRAVENOUS
  Administered 2015-12-24: 100 ug via INTRAVENOUS

## 2015-12-24 MED ORDER — PROPOFOL 10 MG/ML IV BOLUS
INTRAVENOUS | Status: AC
  Filled 2015-12-24: qty 20

## 2015-12-24 MED ORDER — ONDANSETRON HCL 4 MG/2ML IJ SOLN: 4.0000 mg | Freq: Four times a day (QID) | INTRAMUSCULAR | Status: DC | PRN

## 2015-12-24 MED ORDER — HYDROMORPHONE HCL 2 MG/ML IJ SOLN
INTRAMUSCULAR | Status: AC
  Administered 2015-12-24: 0.5 mg
  Filled 2015-12-24: qty 1

## 2015-12-24 MED ORDER — DOCUSATE SODIUM 100 MG PO CAPS: 100.0000 mg | ORAL_CAPSULE | Freq: Two times a day (BID) | ORAL | 0 refills | Status: DC

## 2015-12-24 MED ORDER — SODIUM CHLORIDE 0.9% FLUSH: 3.0000 mL | Freq: Two times a day (BID) | INTRAVENOUS | Status: DC

## 2015-12-24 MED ORDER — LACTATED RINGERS IV SOLN
INTRAVENOUS | Status: DC
Start: 2015-12-24 — End: 2015-12-24
  Administered 2015-12-24 (×2): via INTRAVENOUS

## 2015-12-24 MED ORDER — FENTANYL CITRATE (PF) 100 MCG/2ML IJ SOLN
INTRAMUSCULAR | Status: AC
  Filled 2015-12-24: qty 4

## 2015-12-24 MED ORDER — PROPOFOL 10 MG/ML IV BOLUS
INTRAVENOUS | Status: DC | PRN
  Administered 2015-12-24: 170 mg via INTRAVENOUS

## 2015-12-24 SURGICAL SUPPLY — 43 items
ADH SKN CLS APL DERMABOND .7 (GAUZE/BANDAGES/DRESSINGS) ×2
APPLIER CLIP ROT 10 11.4 M/L (STAPLE) ×3
APR CLP MED LRG 11.4X10 (STAPLE) ×2
BAG SPEC RTRVL LRG 6X4 10 (ENDOMECHANICALS) ×2
CABLE HIGH FREQUENCY MONO STRZ (ELECTRODE) ×3 IMPLANT
CHLORAPREP W/TINT 26ML (MISCELLANEOUS) ×3 IMPLANT
CLIP APPLIE ROT 10 11.4 M/L (STAPLE) ×2 IMPLANT
COVER MAYO STAND STRL (DRAPES) IMPLANT
DECANTER SPIKE VIAL GLASS SM (MISCELLANEOUS) ×3 IMPLANT
DERMABOND ADVANCED (GAUZE/BANDAGES/DRESSINGS) ×1
DERMABOND ADVANCED .7 DNX12 (GAUZE/BANDAGES/DRESSINGS) ×2 IMPLANT
DEVICE PMI PUNCTURE CLOSURE (MISCELLANEOUS) IMPLANT
DRAPE C-ARM 42X120 X-RAY (DRAPES) IMPLANT
ELECT REM PT RETURN 9FT ADLT (ELECTROSURGICAL) ×3
ELECTRODE REM PT RTRN 9FT ADLT (ELECTROSURGICAL) ×2 IMPLANT
GLOVE BIO SURGEON STRL SZ 6 (GLOVE) ×3 IMPLANT
GLOVE BIOGEL PI IND STRL 7.5 (GLOVE) ×3 IMPLANT
GLOVE BIOGEL PI IND STRL 8 (GLOVE) ×1 IMPLANT
GLOVE BIOGEL PI INDICATOR 7.5 (GLOVE) ×3
GLOVE BIOGEL PI INDICATOR 8 (GLOVE) ×1
GLOVE INDICATOR 6.5 STRL GRN (GLOVE) ×3 IMPLANT
GLOVE SURG SS PI 7.5 STRL IVOR (GLOVE) ×2 IMPLANT
GLOVE SURG SS PI 8.0 STRL IVOR (GLOVE) ×2 IMPLANT
GOWN SPEC L3 XXLG W/TWL (GOWN DISPOSABLE) ×2 IMPLANT
GOWN STRL REUS W/TWL 2XL LVL3 (GOWN DISPOSABLE) ×2 IMPLANT
GOWN STRL REUS W/TWL LRG LVL3 (GOWN DISPOSABLE) ×3 IMPLANT
HEMOSTAT SNOW SURGICEL 2X4 (HEMOSTASIS) IMPLANT
IRRIG SUCT STRYKERFLOW 2 WTIP (MISCELLANEOUS) ×3
IRRIGATION SUCT STRKRFLW 2 WTP (MISCELLANEOUS) ×2 IMPLANT
KIT BASIN OR (CUSTOM PROCEDURE TRAY) ×3 IMPLANT
NDL INSUFFLATION 14GA 120MM (NEEDLE) ×1 IMPLANT
NEEDLE INSUFFLATION 14GA 120MM (NEEDLE) ×3 IMPLANT
POUCH SPECIMEN RETRIEVAL 10MM (ENDOMECHANICALS) ×3 IMPLANT
SCISSORS LAP 5X35 DISP (ENDOMECHANICALS) ×3 IMPLANT
SET CHOLANGIOGRAPH MIX (MISCELLANEOUS) IMPLANT
SLEEVE XCEL OPT CAN 5 100 (ENDOMECHANICALS) ×6 IMPLANT
SUT MNCRL AB 4-0 PS2 18 (SUTURE) ×3 IMPLANT
TOWEL OR 17X26 10 PK STRL BLUE (TOWEL DISPOSABLE) ×3 IMPLANT
TOWEL OR NON WOVEN STRL DISP B (DISPOSABLE) IMPLANT
TRAY LAPAROSCOPIC (CUSTOM PROCEDURE TRAY) ×3 IMPLANT
TROCAR BLADELESS OPT 5 100 (ENDOMECHANICALS) ×3 IMPLANT
TROCAR XCEL 12X100 BLDLESS (ENDOMECHANICALS) ×3 IMPLANT
TUBING INSUF HEATED (TUBING) ×3 IMPLANT

## 2015-12-24 NOTE — Anesthesia Preprocedure Evaluation (Signed)
Anesthesia Evaluation  Patient identified by MRN, date of birth, ID band Patient awake    Reviewed: Allergy & Precautions, H&P , NPO status , Patient's Chart, lab work & pertinent test results  Airway Mallampati: II   Neck ROM: full    Dental   Pulmonary sleep apnea , former smoker,    breath sounds clear to auscultation       Cardiovascular negative cardio ROS   Rhythm:regular Rate:Normal     Neuro/Psych  Headaches, PSYCHIATRIC DISORDERS Early dementiaCVA    GI/Hepatic GERD  ,(+) Hepatitis -, CS/p harvoni tx   Endo/Other    Renal/GU      Musculoskeletal  (+) Arthritis ,   Abdominal   Peds  Hematology   Anesthesia Other Findings   Reproductive/Obstetrics                             Anesthesia Physical Anesthesia Plan  ASA: III  Anesthesia Plan: General   Post-op Pain Management:    Induction: Intravenous  Airway Management Planned: Oral ETT  Additional Equipment:   Intra-op Plan:   Post-operative Plan: Extubation in OR  Informed Consent: I have reviewed the patients History and Physical, chart, labs and discussed the procedure including the risks, benefits and alternatives for the proposed anesthesia with the patient or authorized representative who has indicated his/her understanding and acceptance.     Plan Discussed with: CRNA, Anesthesiologist and Surgeon  Anesthesia Plan Comments:         Anesthesia Quick Evaluation

## 2015-12-24 NOTE — Discharge Instructions (Signed)
General Anesthesia, Adult, Care After °These instructions provide you with information about caring for yourself after your procedure. Your health care provider may also give you more specific instructions. Your treatment has been planned according to current medical practices, but problems sometimes occur. Call your health care provider if you have any problems or questions after your procedure. °What can I expect after the procedure? °After the procedure, it is common to have: °· Vomiting. °· A sore throat. °· Mental slowness. °It is common to feel: °· Nauseous. °· Cold or shivery. °· Sleepy. °· Tired. °· Sore or achy, even in parts of your body where you did not have surgery. °Follow these instructions at home: °For at least 24 hours after the procedure:  °· Do not: °¨ Participate in activities where you could fall or become injured. °¨ Drive. °¨ Use heavy machinery. °¨ Drink alcohol. °¨ Take sleeping pills or medicines that cause drowsiness. °¨ Make important decisions or sign legal documents. °¨ Take care of children on your own. °· Rest. °Eating and drinking  °· If you vomit, drink water, juice, or soup when you can drink without vomiting. °· Drink enough fluid to keep your urine clear or pale yellow. °· Make sure you have little or no nausea before eating solid foods. °· Follow the diet recommended by your health care provider. °General instructions  °· Have a responsible adult stay with you until you are awake and alert. °· Return to your normal activities as told by your health care provider. Ask your health care provider what activities are safe for you. °· Take over-the-counter and prescription medicines only as told by your health care provider. °· If you smoke, do not smoke without supervision. °· Keep all follow-up visits as told by your health care provider. This is important. °Contact a health care provider if: °· You continue to have nausea or vomiting at home, and medicines are not helpful. °· You  cannot drink fluids or start eating again. °· You cannot urinate after 8-12 hours. °· You develop a skin rash. °· You have fever. °· You have increasing redness at the site of your procedure. °Get help right away if: °· You have difficulty breathing. °· You have chest pain. °· You have unexpected bleeding. °· You feel that you are having a life-threatening or urgent problem. °This information is not intended to replace advice given to you by your health care provider. Make sure you discuss any questions you have with your health care provider. °Document Released: 04/10/2000 Document Revised: 06/07/2015 Document Reviewed: 12/17/2014 °Elsevier Interactive Patient Education © 2017 [employer] ° ° ° ° °CCS ______[facility] °LAPAROSCOPIC SURGERY: POST OP INSTRUCTIONS °Always review your discharge instruction sheet given to you by the facility where your surgery was performed. °IF YOU HAVE DISABILITY OR FAMILY LEAVE FORMS, YOU MUST BRING THEM TO THE OFFICE FOR PROCESSING.   °DO NOT GIVE THEM TO YOUR DOCTOR. ° °1. A prescription for pain medication may be given to you upon discharge.  Take your pain medication as prescribed, if needed.  If narcotic pain medicine is not needed, then you may take acetaminophen (Tylenol) or ibuprofen (Advil) as needed. °2. Take your usually prescribed medications unless otherwise directed. °3. If you need a refill on your pain medication, please contact your pharmacy.  They will contact our office to request authorization. Prescriptions will not be filled after 5pm or on week-ends. °4. You should follow a light diet the first few days after   arrival home, such as soup and crackers, etc.  Be sure to include lots of fluids daily. °5. Most patients will experience some swelling and bruising in the area of the incisions.  Ice packs will help.  Swelling and bruising can take several days to resolve.  °6. It is common to experience some constipation if taking pain medication  after surgery.  Increasing fluid intake and taking a stool softener (such as Colace) will usually help or prevent this problem from occurring.  A mild laxative (Milk of Magnesia or Miralax) should be taken according to package instructions if there are no bowel movements after 48 hours. °7. Unless discharge instructions indicate otherwise, you may remove your bandages 24-48 hours after surgery, and you may shower at that time.  You may have steri-strips (small skin tapes) in place directly over the incision.  These strips should be left on the skin for 7-10 days.  If your surgeon used skin glue on the incision, you may shower in 24 hours.  The glue will flake off over the next 2-3 weeks.  Any sutures or staples will be removed at the office during your follow-up visit. °8. ACTIVITIES:  You may resume regular (light) daily activities beginning the next day--such as daily self-care, walking, climbing stairs--gradually increasing activities as tolerated.  You may have sexual intercourse when it is comfortable.  Refrain from any heavy lifting or straining until approved by your doctor. °a. You may drive when you are no longer taking prescription pain medication, you can comfortably wear a seatbelt, and you can safely maneuver your car and apply brakes. °b. RETURN TO WORK:  _1-2 weeks_________________________________________________________ °9. You should see your doctor in the office for a follow-up appointment approximately 2-3 weeks after your surgery.  Make sure that you call for this appointment within a day or two after you arrive home to insure a convenient appointment time. °10. OTHER INSTRUCTIONS: __________________________________________________________________________________________________________________________ __________________________________________________________________________________________________________________________ °WHEN TO CALL YOUR DOCTOR: °1. Fever over 101.0 °2. Inability to  urinate °3. Continued bleeding from incision. °4. Increased pain, redness, or drainage from the incision. °5. Increasing abdominal pain ° °The clinic staff is available to answer your questions during regular business hours.  Please don’t hesitate to call and ask to speak to one of the nurses for clinical concerns.  If you have a medical emergency, go to the nearest emergency room or call 911.  A surgeon from [facility] is always on call at the hospital. °[address], [address], [place], [place]  [address] ? [address] [address], , [place]   [address] °[phone] ? [phone] ? [phone] [phone] °Web site: [email or web address] ° °

## 2015-12-24 NOTE — Progress Notes (Signed)
Patient ambulated from room 1301 to 1310 after laparoscopic cholecystectomy and tolerated well.

## 2015-12-24 NOTE — Anesthesia Postprocedure Evaluation (Signed)
Anesthesia Post Note  Patient: Brendan Holland  Procedure(s) Performed: Procedure(s) (LRB): LAPAROSCOPIC CHOLECYSTECTOMY (N/A)  Patient location during evaluation: PACU Anesthesia Type: General Level of consciousness: awake and alert and patient cooperative Pain management: pain level controlled Vital Signs Assessment: post-procedure vital signs reviewed and stable Respiratory status: spontaneous breathing and respiratory function stable Cardiovascular status: stable Anesthetic complications: no    Last Vitals:  Vitals:   12/24/15 1315 12/24/15 1326  BP: 127/82 124/70  Pulse:  63  Resp:  15  Temp: 36.7 C 36.4 C    Last Pain:  Vitals:   12/24/15 1339  PainSc: [name]

## 2015-12-24 NOTE — Transfer of Care (Signed)
Immediate Anesthesia Transfer of Care Note  Patient: Brendan Holland  Procedure(s) Performed: Procedure(s): LAPAROSCOPIC CHOLECYSTECTOMY (N/A)  Patient Location: PACU  Anesthesia Type:General  Level of Consciousness: awake, alert  and oriented  Airway & Oxygen Therapy: Patient Spontanous Breathing and Patient connected to face mask oxygen  Post-op Assessment: Report given to RN and Post -op Vital signs reviewed and stable  Post vital signs: Reviewed and stable  Last Vitals:  Vitals:   12/24/15 0809  BP: 132/72  Pulse: (!) 52  Resp: 18  Temp: 36.4 C    Last Pain: There were no vitals filed for this visit.    Patients Stated Pain Goal: 4 ([id number])  Complications: No apparent anesthesia complications

## 2015-12-24 NOTE — H&P (Signed)
Brendan Holland Location: [facility] Patient #: [id number] DOB: 13-Jun-1950 Married / Language: English / Race: White Male  History of Present Illness  Patient words: This is a very pleasant 65yo man who is here today with his wife for evaluation for laparoscopic cholecystectomy. He has a history of HCV (treated, undetectable) and has undergone annual [place] surveillance. He has not had any liver lesions; last liver bx was 2008 with G2/stage 1 fibrosis. He did have stones up to 1.9cm as of 2016, but on this year's [place] there is a solid mass with incr color flow on doppler, measruing 1.7x1.3cm and concerning for neoplasm. MRI completed, more c/w stone.  CBD 44mm. He is completely asymptomatic- no nausea, vomiting, change in appetite, weight loss, change in bowel function, denies melena/hematochezia or abdominal pain. No shortness of breath or chest pain. Recent labs are unremarkable (WBC 7.2, hgb 14.4, plt 130, cr 1.09, bicarb 25, na/k/cl normal, tbili 1.1, ast/alt 29/21, alkphos 50, alb 4.2, AFP 3.1). Last c-scope normal, 2012. Last EGD 2011- negative.  In fact, he runs about 7 miles four times per week (at a minimum, per his wife). No tobacco or Etoh/drug use currently but remote hx of etoh abuse and heroine addiction. Veteran, former Tour manager.   Other Problems  Cerebrovascular Accident Cholelithiasis Cirrhosis Of Liver Gastroesophageal Reflux Disease Sleep Apnea  Past Surgical History Appendectomy Knee Surgery Right. Shoulder Surgery Right.  Diagnostic Studies History Colonoscopy 1-5 years ago  Allergies  No Known Drug Allergies 11/12/2015  Medication History  Donepezil HCl (10MG  Tablet, Oral) Active. Fluad (0.5ML Susp Pref Syr, Intramuscular) Active. RaNITidine HCl (150MG  Tablet, Oral) Active. Simvastatin (20MG  Tablet, Oral) Active. Aspirin (81MG  Tablet DR, Oral) Active. Medications Reconciled  Social History Alcohol use Remotely quit alcohol  use. Caffeine use Coffee, Tea. Illicit drug use Remotely quit drug use. Tobacco use Former smoker.  Family History Alcohol Abuse Brother, Daughter, Father. Diabetes Mellitus Father. Heart Disease Mother. Hypertension Mother. Respiratory Condition Mother.     Review of Systems  General Not Present- Appetite Loss, Chills, Fatigue, Fever, Night Sweats, Weight Gain and Weight Loss. Skin Not Present- Change in Wart/Mole, Dryness, Hives, Jaundice, New Lesions, Non-Healing Wounds, Rash and Ulcer. HEENT Not Present- Earache, Hearing Loss, Hoarseness, Nose Bleed, Oral Ulcers, Ringing in the Ears, Seasonal Allergies, Sinus Pain, Sore Throat, Visual Disturbances, Wears glasses/contact lenses and Yellow Eyes. Respiratory Not Present- Bloody sputum, Chronic Cough, Difficulty Breathing, Snoring and Wheezing. Cardiovascular Present- Leg Cramps. Not Present- Chest Pain, Difficulty Breathing Lying Down, Palpitations, Rapid Heart Rate, Shortness of Breath and Swelling of Extremities. Gastrointestinal Not Present- Abdominal Pain, Bloating, Bloody Stool, Change in Bowel Habits, Chronic diarrhea, Constipation, Difficulty Swallowing, Excessive gas, Gets full quickly at meals, Hemorrhoids, Indigestion, Nausea, Rectal Pain and Vomiting. Male Genitourinary Not Present- Blood in Urine, Change in Urinary Stream, Frequency, Impotence, Nocturia, Painful Urination, Urgency and Urine Leakage.  Vitals:   12/24/15 0809  BP: 132/72  Pulse: (!) 52  Resp: 18  Temp: 97.6 F (36.4 C)     Physical Exam  General Mental Status-Alert. General Appearance-Consistent with stated age. Hydration-Well hydrated. Voice-Normal.  Head and Neck Head-normocephalic, atraumatic with no lesions or palpable masses. Trachea-midline. Thyroid Gland Characteristics - normal size and consistency.  Eye Eyeball - Bilateral-Extraocular movements intact. Sclera/Conjunctiva - Bilateral-No scleral  icterus.  Chest and Lung Exam Chest and lung exam reveals -quiet, even and easy respiratory effort with no use of accessory muscles and on auscultation, normal breath sounds, no adventitious sounds and normal  vocal resonance. Inspection Chest Wall - Normal. Back - normal.  Cardiovascular Cardiovascular examination reveals -normal heart sounds, regular rate and rhythm with no murmurs and normal pedal pulses bilaterally.  Abdomen Inspection Inspection of the abdomen reveals - No Hernias. Palpation/Percussion Palpation and Percussion of the abdomen reveal - Soft, Non Tender, No Rebound tenderness, No Rigidity (guarding) and No hepatosplenomegaly. Auscultation Auscultation of the abdomen reveals - Bowel sounds normal.  Neurologic Neurologic evaluation reveals -alert and oriented x 3 with no impairment of recent or remote memory. Mental Status-Normal.  Musculoskeletal Global Assessment -Note:no gross deformities.  Normal Exam - Left-Upper Extremity Strength Normal and Lower Extremity Strength Normal. Normal Exam - Right-Upper Extremity Strength Normal and Lower Extremity Strength Normal.  Lymphatic Head & Neck  General Head & Neck Lymphatics: Bilateral - Description - Normal. Axillary  General Axillary Region: Bilateral - Description - Normal. Tenderness - Non Tender. Femoral & Inguinal  Generalized Femoral & Inguinal Lymphatics: Bilateral - Description - No Generalized lymphadenopathy.    Assessment & Plan  GALLBLADDER MASS vs stone (Principal Diagnosis) (K82.8) Story: Asymptomatic and more c/w stone on MRI;   Will plan laparoscopic cholecystectomy, possible liver biopsy. We discussed the risks/benefits, specifically pain, bleeding, scarring, infection, injury to adjacent structures specifically the common bile duct and sequelae of that complication. We discussed the possibility that malignancy may be found in depending on the depths, he may  require further surgical intervention by hepatobiliary surgeon. He is scheduled for colonoscopy on November 7, and we'll plan to schedule his gallbladder surgery after this.

## 2015-12-24 NOTE — Op Note (Signed)
Operative Note  Brendan Holland 65 y.o. male [id number]  12/24/2015  Surgeon: Clovis Riley   Assistant: none  Procedure performed: Laparoscopic Cholecystectomy  Preop diagnosis: cholelithiasis vs mass  Post-op diagnosis/intraop findings: cholelithiasis  Specimens: gallbladder  EBL: [id number]  Complications: none  Description of procedure: After obtaining informed consent the patient was brought to the operating room. Prophylactic antibiotics and subcutaneous heparin were administered. SCD's were applied. General endotracheal anesthesia was initiated and a formal time-out was performed. The abdomen was prepped and draped in the usual sterile fashion and the abdomen was entered using an infraumbilical veress needle after instilling the site with local. Insufflation to 59mmHg was obtained, a 59mm trocar and camera introduced, and gross inspection revealed no evidence of injury from our entry however he did have omental adhesions to the anterior abdominal wall surrounding the umbilicus and on the right side of the abdomen. A 12 mm trocar was placed under direct visualization in the epigastrium and blunt and sharp adhesiolysis was undertaken along the superior anterior abdominal wall area to provide other trocar. The transverse colon and nearby viscera were then once again inspected to confirm no injury from our entry or adhesiolysis. Two 67mm trocars were introduced in the right midclavicular and right anterior axillary lines under direct visualization and following infiltration with local. The gallbladder was retracted cephalad and the infundibulum was retracted laterally. A combination of hook electrocautery and blunt dissection was utilized to clear the peritoneum from the neck and cystic duct, circumferentially isolating the cystic artery and cystic duct and lifting the gallbladder from the cystic plate. The critical view of safety was achieved with the cystic artery, cystic duct, and liver bed  visualized between them with no other structures. The artery was clipped with a single clip proximally and distally and divided as was the cystic duct with two clips on the proximal end. The gallbladder was dissected from the liver plate using electrocautery. Once freed the gallbladder was placed in an endocatch bag and removed through the epigastric trocar site intact. Hemostasis was once again confirmed, and reinspection of the abdomen revealed no injuries. The clips were well opposed without any bile leak from the duct or the liver bed. The 75mm trocar site in the epigastrium was closed with a 0 vicryl in the fascia under direct visualization using a PMI device. The liver had been inspected during the case and did have evidence of fibrosis, with a nodular appearance and rounded edges. No discrete masses. I elected not to incur the risk of performing a liver biopsy given that his hepatic function at this point is well-preserved, and given the finding of a gallstone as opposed to a mass he will not likely need further surgical intervention. The abdomen was desufflated and all trocars removed. The skin incisions were closed with running subcuticular monocryl and dermabond. The patient was awakened, extubated and transported to the recovery room in stable condition.   All counts were correct at the completion of the case.

## 2015-12-27 ENCOUNTER — Encounter (HOSPITAL_COMMUNITY): Payer: Self-pay | Admitting: Surgery

## 2015-12-27 NOTE — Addendum Note (Signed)
Addendum  created 12/27/15 [id number] by Lollie Sails, CRNA   Charge Capture section accepted

## 2016-01-14 ENCOUNTER — Telehealth (INDEPENDENT_AMBULATORY_CARE_PROVIDER_SITE_OTHER): Payer: Self-pay | Admitting: Orthopaedic Surgery

## 2016-01-14 NOTE — Telephone Encounter (Signed)
Patient's wife (pat) called asked if patient can be worked into Dr [name] schedule as soon as possible. Patient is having pain in his right buttocks. The number to contact him is [phone]

## 2016-01-18 ENCOUNTER — Ambulatory Visit (INDEPENDENT_AMBULATORY_CARE_PROVIDER_SITE_OTHER): Payer: Self-pay | Admitting: Orthopaedic Surgery

## 2016-01-19 NOTE — Telephone Encounter (Signed)
Pt has appt in [place] office tomorrow afternoon.

## 2016-01-20 ENCOUNTER — Ambulatory Visit (INDEPENDENT_AMBULATORY_CARE_PROVIDER_SITE_OTHER): Payer: Medicare Other

## 2016-01-20 ENCOUNTER — Encounter (INDEPENDENT_AMBULATORY_CARE_PROVIDER_SITE_OTHER): Payer: Self-pay | Admitting: Orthopaedic Surgery

## 2016-01-20 ENCOUNTER — Ambulatory Visit (INDEPENDENT_AMBULATORY_CARE_PROVIDER_SITE_OTHER): Payer: Medicare Other | Admitting: Orthopaedic Surgery

## 2016-01-20 ENCOUNTER — Ambulatory Visit (INDEPENDENT_AMBULATORY_CARE_PROVIDER_SITE_OTHER): Payer: Self-pay

## 2016-01-20 ENCOUNTER — Ambulatory Visit (INDEPENDENT_AMBULATORY_CARE_PROVIDER_SITE_OTHER): Payer: Self-pay | Admitting: Orthopaedic Surgery

## 2016-01-20 VITALS — BP 119/71 | HR 53 | Ht 72.0 in | Wt 167.0 lb

## 2016-01-20 DIAGNOSIS — M47816 Spondylosis without myelopathy or radiculopathy, lumbar region: Secondary | ICD-10-CM

## 2016-01-20 DIAGNOSIS — M791 Myalgia: Secondary | ICD-10-CM | POA: Diagnosis not present

## 2016-01-20 DIAGNOSIS — M7918 Myalgia, other site: Secondary | ICD-10-CM
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
## 2016-02-07 ENCOUNTER — Telehealth (INDEPENDENT_AMBULATORY_CARE_PROVIDER_SITE_OTHER): Payer: Self-pay | Admitting: Orthopaedic Surgery

## 2016-02-07 MED ORDER — PREDNISONE 5 MG (21) PO TBPK: ORAL_TABLET | ORAL | 0 refills | Status: DC

## 2016-02-07 NOTE — Telephone Encounter (Signed)
OK prednisone 5mg  dose pack # 21. 6,5,4,3,2,1 then stop, take with food. ucall thanks

## 2016-02-07 NOTE — Telephone Encounter (Signed)
Please advise 

## 2016-02-07 NOTE — Telephone Encounter (Signed)
I called patient and advised. Script sent to his pharmacy.

## 2016-02-07 NOTE — Telephone Encounter (Signed)
Pt requested an rx for prednisone for rt hip. He stated dr [name] would give this to him on his last visit if he called in after.  [phone]

## 2016-02-11 ENCOUNTER — Ambulatory Visit (INDEPENDENT_AMBULATORY_CARE_PROVIDER_SITE_OTHER): Payer: Medicare Other | Admitting: Orthopaedic Surgery

## 2016-02-11 ENCOUNTER — Encounter (INDEPENDENT_AMBULATORY_CARE_PROVIDER_SITE_OTHER): Payer: Self-pay | Admitting: Orthopaedic Surgery

## 2016-02-11 VITALS — BP 140/81 | HR 46 | Ht 72.0 in | Wt 167.0 lb

## 2016-02-11 DIAGNOSIS — M545 Low back pain, unspecified: Secondary | ICD-10-CM
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
## 2016-03-08 ENCOUNTER — Encounter (INDEPENDENT_AMBULATORY_CARE_PROVIDER_SITE_OTHER): Payer: Self-pay | Admitting: Orthopaedic Surgery

## 2016-03-08 ENCOUNTER — Ambulatory Visit (INDEPENDENT_AMBULATORY_CARE_PROVIDER_SITE_OTHER): Payer: Medicare Other | Admitting: Orthopaedic Surgery

## 2016-03-08 VITALS — Ht 72.0 in | Wt 167.0 lb

## 2016-03-08 DIAGNOSIS — M545 Low back pain: Secondary | ICD-10-CM

## 2016-03-08 DIAGNOSIS — G8929 Other chronic pain: Secondary | ICD-10-CM | POA: Diagnosis not present
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
## 2016-03-09 ENCOUNTER — Telehealth (INDEPENDENT_AMBULATORY_CARE_PROVIDER_SITE_OTHER): Payer: Self-pay | Admitting: Orthopaedic Surgery

## 2016-03-09 MED ORDER — TRAMADOL HCL 50 MG PO TABS: 50.0000 mg | ORAL_TABLET | Freq: Two times a day (BID) | ORAL | 0 refills | Status: DC | PRN

## 2016-03-09 NOTE — Telephone Encounter (Signed)
OK for ultram 50mg  # 10 tabs.    One po bid prn pain. ucall thanks

## 2016-03-09 NOTE — Telephone Encounter (Signed)
Called to pharmacy. I called patient to advise. 

## 2016-03-09 NOTE — Telephone Encounter (Signed)
Patient is requesting pain medication  Cb#: [phone] Pharmacy[facility] in [place]

## 2016-03-09 NOTE — Telephone Encounter (Signed)
Please advise. Jeneen Rinks saw the patient yesterday in clinic. He is awaiting MRI. Thanks.

## 2016-03-14 ENCOUNTER — Ambulatory Visit (INDEPENDENT_AMBULATORY_CARE_PROVIDER_SITE_OTHER): Payer: Medicare Other | Admitting: Orthopaedic Surgery

## 2016-03-15 ENCOUNTER — Other Ambulatory Visit: Payer: Medicare Other

## 2016-03-15 ENCOUNTER — Ambulatory Visit
Admission: RE | Admit: 2016-03-15 | Discharge: 2016-03-15 | Disposition: A | Discharge status: Home / Self Care | Payer: Medicare Other | Source: Ambulatory Visit | Attending: Orthopaedic Surgery | Admitting: Orthopaedic Surgery

## 2016-03-15 DIAGNOSIS — G8929 Other chronic pain: Secondary | ICD-10-CM

## 2016-03-15 DIAGNOSIS — M545 Low back pain: Principal | ICD-10-CM

## 2016-03-20 ENCOUNTER — Telehealth (INDEPENDENT_AMBULATORY_CARE_PROVIDER_SITE_OTHER): Payer: Self-pay | Admitting: Orthopaedic Surgery

## 2016-03-20 NOTE — Telephone Encounter (Signed)
Patient called had some questions about his MRI results. CB # [phone]

## 2016-03-22 NOTE — Telephone Encounter (Signed)
I left voicemail for patient advising Dr. [name] is out of the office this week and that he needs follow up appointment to review MRI results.

## 2016-04-05 ENCOUNTER — Ambulatory Visit (INDEPENDENT_AMBULATORY_CARE_PROVIDER_SITE_OTHER): Payer: Medicare Other | Admitting: Orthopaedic Surgery

## 2016-04-05 ENCOUNTER — Encounter (INDEPENDENT_AMBULATORY_CARE_PROVIDER_SITE_OTHER): Payer: Self-pay | Admitting: Orthopaedic Surgery

## 2016-04-05 VITALS — BP 136/81 | HR 73

## 2016-04-05 DIAGNOSIS — M47816 Spondylosis without myelopathy or radiculopathy, lumbar region: Secondary | ICD-10-CM | POA: Diagnosis not present

## 2016-04-05 NOTE — Addendum Note (Signed)
Addended by: Meyer Cory on: 04/05/2016 11:45 AM   Modules accepted: Orders
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
## 2016-04-07 ENCOUNTER — Telehealth (INDEPENDENT_AMBULATORY_CARE_PROVIDER_SITE_OTHER): Payer: Self-pay | Admitting: Orthopaedic Surgery

## 2016-04-07 MED ORDER — TRAMADOL HCL 50 MG PO TABS: 50.0000 mg | ORAL_TABLET | Freq: Two times a day (BID) | ORAL | 0 refills | Status: DC | PRN

## 2016-04-07 NOTE — Telephone Encounter (Signed)
OK - thanks

## 2016-04-07 NOTE — Telephone Encounter (Signed)
Called to pharmacy 

## 2016-04-07 NOTE — Telephone Encounter (Signed)
Patient called stating that he does need his tramadol refilled.  CB#[phone]

## 2016-04-07 NOTE — Telephone Encounter (Signed)
Please advise 

## 2016-04-10 ENCOUNTER — Ambulatory Visit (INDEPENDENT_AMBULATORY_CARE_PROVIDER_SITE_OTHER): Payer: Medicare Other

## 2016-04-10 ENCOUNTER — Ambulatory Visit (INDEPENDENT_AMBULATORY_CARE_PROVIDER_SITE_OTHER): Payer: Medicare Other | Admitting: Physical Medicine and Rehabilitation

## 2016-04-10 ENCOUNTER — Encounter (INDEPENDENT_AMBULATORY_CARE_PROVIDER_SITE_OTHER): Payer: Self-pay | Admitting: Physical Medicine and Rehabilitation

## 2016-04-10 VITALS — BP 144/83 | HR 56

## 2016-04-10 DIAGNOSIS — M5416 Radiculopathy, lumbar region: Secondary | ICD-10-CM

## 2016-04-10 MED ORDER — LIDOCAINE HCL (PF) 1 % IJ SOLN
0.3300 mL | Freq: Once | INTRAMUSCULAR | Status: AC
  Administered 2016-04-10: 0.3 mL

## 2016-04-10 MED ORDER — METHYLPREDNISOLONE ACETATE 80 MG/ML IJ SUSP
80.0000 mg | Freq: Once | INTRAMUSCULAR | Status: AC
  Administered 2016-04-10: 80 mg

## 2016-04-10 NOTE — Patient Instructions (Signed)
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
## 2016-04-11 ENCOUNTER — Ambulatory Visit (INDEPENDENT_AMBULATORY_CARE_PROVIDER_SITE_OTHER): Payer: Medicare Other | Admitting: Orthopaedic Surgery

## 2016-04-11 NOTE — Procedures (Signed)
Lumbosacral Transforaminal Epidural Steroid Injection - Infraneural Approach with Fluoroscopic Guidance  Patient: Brendan Holland      Date of Birth: 07/27/1950 MRN: [id number] PCP: [name], MD      Visit Date: 04/10/2016   Universal Protocol:    Date/Time: 03/27/185:55 AM  Consent Given By: the patient  Position: PRONE   Additional Comments: Vital signs were monitored before and after the procedure. Patient was prepped and draped in the usual sterile fashion. The correct patient, procedure, and site was verified.   Injection Procedure Details:  Procedure Site One Meds Administered:  Meds ordered this encounter  Medications  . lidocaine (PF) (XYLOCAINE) 1 % injection 0.3 mL  . methylPREDNISolone acetate (DEPO-MEDROL) injection 80 mg      Laterality: Right  Location/Site:  L5-S1  Needle size: 22 G  Needle type: Spinal  Needle Placement: Transforaminal  Findings:  -Contrast Used: 1 mL iohexol 180 mg iodine/mL   -Comments: Excellent flow of contrast along the nerve and into the epidural space.  Procedure Details: After squaring off the end-plates of the desired vertebral level to get a true AP view, the C-arm was obliqued to the painful side so that the superior articulating process is positioned about 1/3 the length of the inferior endplate.  The needle was aimed toward the junction of the superior articular process and the transverse process of the inferior vertebrae. The needle's initial entry is in the lower third of the foramen through Kambin's triangle. The soft tissues overlying this target were infiltrated with 2-3 ml. of 1% Lidocaine without Epinephrine.  The spinal needle was then inserted and advanced toward the target using a "trajectory" view along the fluoroscope beam.  Under AP and lateral visualization, the needle was advanced so it did not puncture dura and did not traverse medially beyond the 6 o'clock position of the pedicle. Bi-planar projections  were used to confirm position. Aspiration was confirmed to be negative for CSF and/or blood. A 1-2 ml. volume of Isovue-250 was injected and flow of contrast was noted at each level. Radiographs were obtained for documentation purposes.   After attaining the desired flow of contrast documented above, a 0.5 to 1.0 ml test dose of 0.25% Marcaine was injected into each respective transforaminal space.  The patient was observed for 90 seconds post injection.  After no sensory deficits were reported, and normal lower extremity motor function was noted,   the above injectate was administered so that equal amounts of the injectate were placed at each foramen (level) into the transforaminal epidural space.   Additional Comments:  The patient tolerated the procedure well Dressing: Band-Aid    Post-procedure details: Patient was observed during the procedure. Post-procedure instructions were reviewed.  Patient left the clinic in stable condition.

## 2016-05-09 ENCOUNTER — Ambulatory Visit (INDEPENDENT_AMBULATORY_CARE_PROVIDER_SITE_OTHER): Payer: Medicare Other | Admitting: Orthopaedic Surgery

## 2016-05-09 ENCOUNTER — Encounter (INDEPENDENT_AMBULATORY_CARE_PROVIDER_SITE_OTHER): Payer: Self-pay | Admitting: Orthopaedic Surgery

## 2016-05-09 VITALS — BP 148/81 | HR 51 | Ht 72.0 in | Wt 170.0 lb

## 2016-05-09 DIAGNOSIS — M47816 Spondylosis without myelopathy or radiculopathy, lumbar region: Secondary | ICD-10-CM
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
## 2016-05-30 ENCOUNTER — Telehealth (INDEPENDENT_AMBULATORY_CARE_PROVIDER_SITE_OTHER): Payer: Self-pay | Admitting: Orthopaedic Surgery

## 2016-05-30 DIAGNOSIS — M4726 Other spondylosis with radiculopathy, lumbar region: Secondary | ICD-10-CM

## 2016-05-30 NOTE — Telephone Encounter (Signed)
PT CALLED AND STATED HE HAS GONE TO PT THAT A CHIROPRACTOR REFERRED HIM TO AND WANTS TO KNOW IF WE WILL REFER TO PT AS WELL...PLEASE ADVISE.  [phone]

## 2016-05-30 NOTE — Telephone Encounter (Signed)
Ok with me 

## 2016-05-30 NOTE — Telephone Encounter (Signed)
Please advise 

## 2016-05-31 NOTE — Telephone Encounter (Signed)
I spoke with patient. He wants to continue being seen at [facility]. I spoke with Alyse Low at [facility] PT to get fax number for order and she states that we were sent initial eval by fax and if Dr. [name] would sign and fax back, that would be all that they need.  [phone] (P)  [phone] (F)   Patient would also like to have second ESI. Per last office note, patient would call back if he wanted to schedule another. OK to enter order? Last [facility] 04/10/2016.

## 2016-06-01 NOTE — Addendum Note (Signed)
Addended by: Meyer Cory on: 06/01/2016 08:59 AM   Modules accepted: Orders

## 2016-06-01 NOTE — Telephone Encounter (Signed)
OK - thanks

## 2016-06-01 NOTE — Telephone Encounter (Signed)
ESI order entered

## 2016-06-19 ENCOUNTER — Ambulatory Visit (INDEPENDENT_AMBULATORY_CARE_PROVIDER_SITE_OTHER): Payer: Medicare Other

## 2016-06-19 ENCOUNTER — Encounter (INDEPENDENT_AMBULATORY_CARE_PROVIDER_SITE_OTHER): Payer: Self-pay | Admitting: Physical Medicine and Rehabilitation

## 2016-06-19 ENCOUNTER — Ambulatory Visit (INDEPENDENT_AMBULATORY_CARE_PROVIDER_SITE_OTHER): Payer: Medicare Other | Admitting: Physical Medicine and Rehabilitation

## 2016-06-19 VITALS — BP 144/81 | HR 46 | Temp 97.7°F

## 2016-06-19 DIAGNOSIS — M5416 Radiculopathy, lumbar region: Secondary | ICD-10-CM

## 2016-06-19 MED ORDER — LIDOCAINE HCL (PF) 1 % IJ SOLN
2.0000 mL | Freq: Once | INTRAMUSCULAR | Status: AC
  Administered 2016-06-19: 2 mL

## 2016-06-19 MED ORDER — METHYLPREDNISOLONE ACETATE 80 MG/ML IJ SUSP
80.0000 mg | Freq: Once | INTRAMUSCULAR | Status: AC
  Administered 2016-06-19: 80 mg

## 2016-06-19 NOTE — Progress Notes (Deleted)
Right sided lower back pain. No leg pain. Numbness and tingling right buttock. Driving or stepping a certain way, twisting, make pain worse.

## 2016-06-19 NOTE — Procedures (Signed)
Lumbar Epidural Steroid Injection - Interlaminar Approach with Fluoroscopic Guidance  Patient: Brendan Holland      Date of Birth: Jul 03, 1950 MRN: [id number] PCP: [name], MD      Visit Date: 06/19/2016   [name] is a 66 year old gentleman that we completed a right L5 transforaminal injection several weeks ago and he got almost 50% relief.  Repeat the injection today he is follow-up with Dr. [name]. 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  Universal Protocol:    Date/Time: 06/04/188:36 AM  Consent Given By: the patient  Position: PRONE  Additional Comments: Vital signs were monitored before and after the procedure. Patient was prepped and draped in the usual sterile fashion. The correct patient, procedure, and site was verified.   Injection Procedure Details:  Procedure Site One Meds Administered:  Meds ordered this encounter  Medications  . lidocaine (PF) (XYLOCAINE) 1 % injection 2 mL  . methylPREDNISolone acetate (DEPO-MEDROL) injection 80 mg     Laterality: Right  Location/Site:  L5-S1  Needle size: 22 G  Needle type: spinal needle  Needle Placement: Paramedian epidural  Findings:  -Contrast Used: 1 mL iohexol 180 mg iodine/mL    -Comments: Excellent flow of contrast along the nerve and into the epidural space.  Procedure Details: Using a paramedian approach from the side mentioned above, the region overlying the inferior lamina was localized under fluoroscopic visualization and the soft tissues overlying this structure were infiltrated with 4 ml. of 1% Lidocaine without Epinephrine. The Tuohy needle was inserted into the epidural space using a paramedian approach.   The epidural space was localized using loss of resistance along with lateral and bi-planar fluoroscopic views.  After negative aspirate for air, blood, and CSF, a 2 ml. volume of Isovue-250 was injected into the epidural space and the flow of contrast was observed. Radiographs were  obtained for documentation purposes.    The injectate was administered into the level noted above.   Additional Comments:   Dressing:     Post-procedure details: Patient was observed during the procedure. Post-procedure instructions were reviewed.  Patient left the clinic in stable condition.

## 2016-06-19 NOTE — Patient Instructions (Signed)

## 2016-06-19 NOTE — Addendum Note (Signed)
Addended by: Raymondo Band on: 06/19/2016 09:00 AM   Modules accepted: Orders, SmartSet

## 2016-06-19 NOTE — Procedures (Signed)
Lumbosacral Transforaminal Epidural Steroid Injection - Infraneural Approach with Fluoroscopic Guidance  Patient: Brendan Holland      Date of Birth: November 27, 1950 MRN: [id number] PCP: [name], MD      Visit Date: 06/19/2016  Brendan Holland is a 66 year old gentleman that we completed a right L5 transforaminal injection several weeks ago and he got almost 50% relief.  We will repeat the injection today he has had  follow-up with Dr. [name]. 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  Universal Protocol:     Consent Given By: the patient  Position: PRONE   Additional Comments: Vital signs were monitored before and after the procedure. Patient was prepped and draped in the usual sterile fashion. The correct patient, procedure, and site was verified.   Injection Procedure Details:  Procedure Site One Meds Administered:  Meds ordered this encounter  Medications  . lidocaine (PF) (XYLOCAINE) 1 % injection 2 mL  . methylPREDNISolone acetate (DEPO-MEDROL) injection 80 mg      Laterality: Right  Location/Site:  L5-S1  Needle size: 22 G  Needle type: Spinal  Needle Placement: Transforaminal  Findings:  -Contrast Used: 1 mL iohexol 180 mg iodine/mL   -Comments: Excellent flow of contrast along the nerve and into the epidural space.  Procedure Details: After squaring off the end-plates of the desired vertebral level to get a true AP view, the C-arm was obliqued to the painful side so that the superior articulating process is positioned about 1/3 the length of the inferior endplate.  The needle was aimed toward the junction of the superior articular process and the transverse process of the inferior vertebrae. The needle's initial entry is in the lower third of the foramen through Kambin's triangle. The soft tissues overlying this target were infiltrated with 2-3 ml. of 1% Lidocaine without Epinephrine.  The spinal needle was then inserted and advanced toward the target using a  "trajectory" view along the fluoroscope beam.  Under AP and lateral visualization, the needle was advanced so it did not puncture dura and did not traverse medially beyond the 6 o'clock position of the pedicle. Bi-planar projections were used to confirm position. Aspiration was confirmed to be negative for CSF and/or blood. A 1-2 ml. volume of Isovue-250 was injected and flow of contrast was noted at each level. Radiographs were obtained for documentation purposes.   After attaining the desired flow of contrast documented above, a 0.5 to 1.0 ml test dose of 0.25% Marcaine was injected into each respective transforaminal space.  The patient was observed for 90 seconds post injection.  After no sensory deficits were reported, and normal lower extremity motor function was noted,   the above injectate was administered so that equal amounts of the injectate were placed at each foramen (level) into the transforaminal epidural space.   Additional Comments:  The patient tolerated the procedure well No complications occurred Dressing: Band-Aid    Post-procedure details: Patient was observed during the procedure. Post-procedure instructions were reviewed.  Patient left the clinic in stable condition.

## 2016-06-19 NOTE — Progress Notes (Signed)
Please note that the patient did indeed have a transforaminal injection performed on the right at L5. The procedure note was inadvertently completed for another patient using interlaminar approach.

## 2016-06-29 ENCOUNTER — Telehealth (INDEPENDENT_AMBULATORY_CARE_PROVIDER_SITE_OTHER): Payer: Self-pay | Admitting: Orthopaedic Surgery

## 2016-06-29 MED ORDER — IBUPROFEN 800 MG PO TABS: 800.0000 mg | ORAL_TABLET | Freq: Three times a day (TID) | ORAL | 0 refills | Status: DC | PRN

## 2016-06-29 NOTE — Telephone Encounter (Signed)
Patient called needing Rx refilled (Ibuprofen 800 mg) The number to contact patient is [phone]

## 2016-06-29 NOTE — Telephone Encounter (Signed)
Sent to pharmacy. I left voicemail advising. 

## 2016-06-29 NOTE — Telephone Encounter (Signed)
Ok thx.

## 2016-06-29 NOTE — Telephone Encounter (Signed)
Ok for refill? 

## 2016-07-03 ENCOUNTER — Telehealth (INDEPENDENT_AMBULATORY_CARE_PROVIDER_SITE_OTHER): Payer: Self-pay | Admitting: Orthopaedic Surgery

## 2016-07-03 NOTE — Telephone Encounter (Signed)
Patient left message requesting an appointment to see Dr [name], he fell on Saturday and hurt his wrist and Right knee. Can you please advise?

## 2016-07-03 NOTE — Telephone Encounter (Signed)
noted 

## 2016-07-03 NOTE — Telephone Encounter (Signed)
Sch'd appt for 07/05/16 @ 10:15, had a cancellation

## 2016-07-05 ENCOUNTER — Ambulatory Visit (INDEPENDENT_AMBULATORY_CARE_PROVIDER_SITE_OTHER): Payer: Medicare Other | Admitting: Orthopaedic Surgery

## 2016-07-05 ENCOUNTER — Ambulatory Visit (INDEPENDENT_AMBULATORY_CARE_PROVIDER_SITE_OTHER): Payer: Medicare Other

## 2016-07-05 ENCOUNTER — Encounter (INDEPENDENT_AMBULATORY_CARE_PROVIDER_SITE_OTHER): Payer: Self-pay | Admitting: Orthopaedic Surgery

## 2016-07-05 VITALS — BP 134/78 | HR 44 | Ht 72.0 in | Wt 170.0 lb

## 2016-07-05 DIAGNOSIS — M25561 Pain in right knee: Secondary | ICD-10-CM

## 2016-07-05 DIAGNOSIS — M25531 Pain in right wrist: Secondary | ICD-10-CM
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
## 2016-07-10 ENCOUNTER — Other Ambulatory Visit (INDEPENDENT_AMBULATORY_CARE_PROVIDER_SITE_OTHER): Payer: Self-pay | Admitting: Orthopaedic Surgery

## 2016-07-10 NOTE — Telephone Encounter (Signed)
Ok refill once. I do not recall putting 5 refills on it as it says   Up to 5 refills by 09/24/16.  Call them and OK one refill. 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 thanks

## 2016-07-10 NOTE — Telephone Encounter (Signed)
Ok for refill? 

## 2016-07-11 NOTE — Telephone Encounter (Signed)
Called pharmacy and advised. 5 refills have not been sent in. With directions, he could only have script refilled 5 times by date.

## 2016-07-17 ENCOUNTER — Telehealth (INDEPENDENT_AMBULATORY_CARE_PROVIDER_SITE_OTHER): Payer: Self-pay | Admitting: *Deleted

## 2016-07-17 NOTE — Telephone Encounter (Signed)
LMOM images are ready for pick up

## 2016-07-17 NOTE — Telephone Encounter (Signed)
Pt asking for paper copy of knee x-ray. 2 weeks ago.

## 2016-08-01 ENCOUNTER — Ambulatory Visit (INDEPENDENT_AMBULATORY_CARE_PROVIDER_SITE_OTHER): Payer: Medicare Other | Admitting: Orthopaedic Surgery

## 2016-08-22 ENCOUNTER — Encounter (INDEPENDENT_AMBULATORY_CARE_PROVIDER_SITE_OTHER): Payer: Self-pay | Admitting: Orthopaedic Surgery

## 2016-08-22 ENCOUNTER — Ambulatory Visit (INDEPENDENT_AMBULATORY_CARE_PROVIDER_SITE_OTHER): Payer: Medicare Other | Admitting: Orthopaedic Surgery

## 2016-08-22 VITALS — BP 153/84 | HR 58 | Ht 72.0 in | Wt 165.0 lb

## 2016-08-22 DIAGNOSIS — M47816 Spondylosis without myelopathy or radiculopathy, lumbar region: Secondary | ICD-10-CM | POA: Diagnosis not present

## 2016-08-22 DIAGNOSIS — S63501D Unspecified sprain of right wrist, subsequent encounter: Secondary | ICD-10-CM | POA: Diagnosis not present
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
## 2016-08-25 ENCOUNTER — Other Ambulatory Visit (INDEPENDENT_AMBULATORY_CARE_PROVIDER_SITE_OTHER): Payer: Self-pay | Admitting: Orthopaedic Surgery

## 2016-08-25 NOTE — Telephone Encounter (Signed)
Ok for # 15 tabs. ucall and let him know thanks.

## 2016-08-25 NOTE — Telephone Encounter (Signed)
Refill request

## 2017-05-12 IMAGING — MR MR LUMBAR SPINE W/O CM
5 series · 40 of 48 positions shown · non-contrast
Comparison: None.

CLINICAL DATA: 65 y/o M; chronic right-sided lower back pain and
right buttocks pain.

EXAM:
MRI LUMBAR SPINE WITHOUT CONTRAST
TECHNIQUE: Multiplanar, multisequence MR imaging of the lumbar spine was
performed. No intravenous contrast was administered.

[Series 3: T2 · sagittal · 4.0mm · 0.88mm/px · 6 of 12 slices shown (1 of 2)]
[im 1/12]
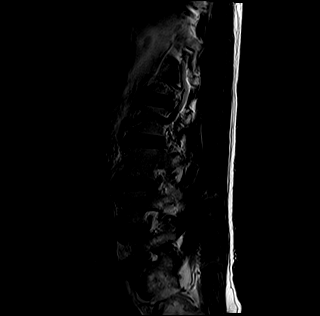
[im 3/12]
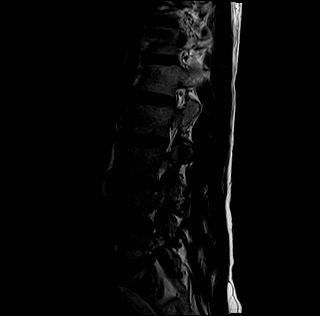
[im 5/12]
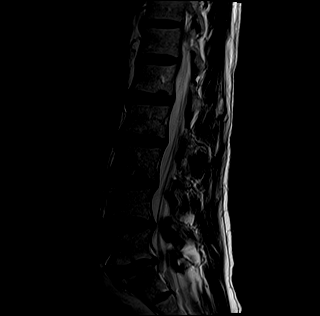
[im 7/12]
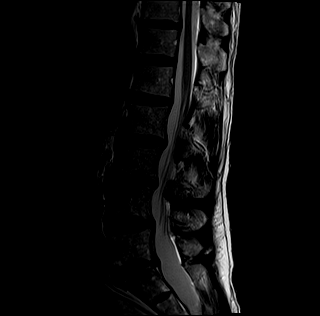
[im 9/12]
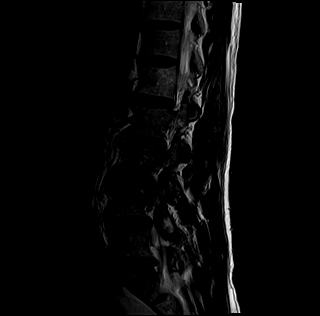
[im 12/12]
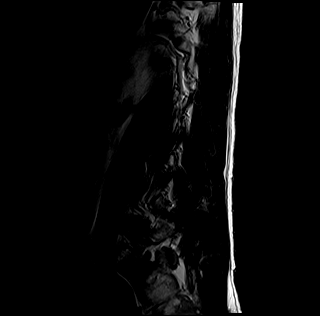

[Series 4: STIR · sagittal · 4.0mm · 0.55mm/px · 5 of 12 slices shown]
[im 1/12]
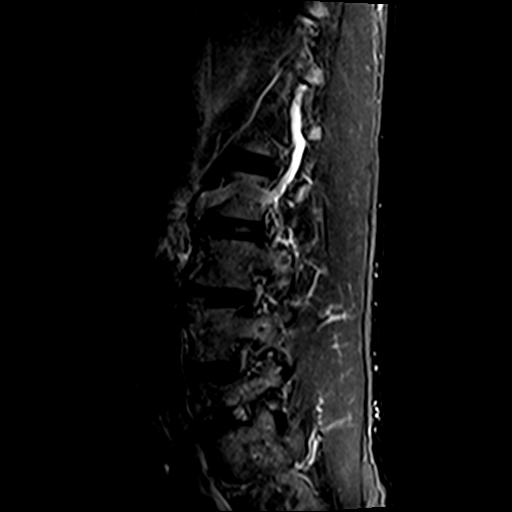
[im 3/12]
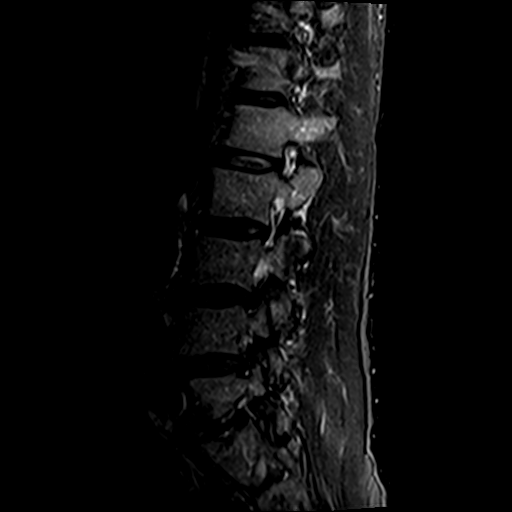
[im 6/12]
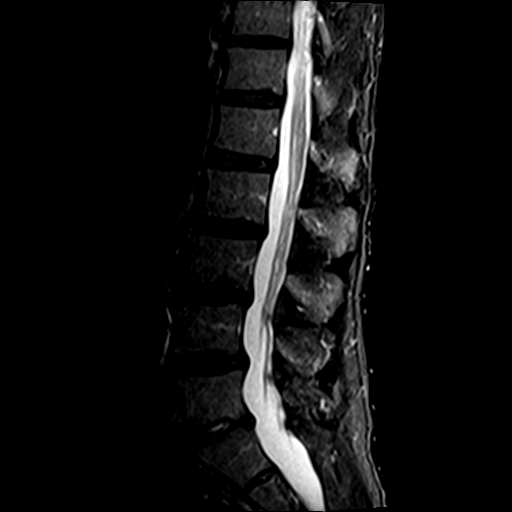
[im 9/12]
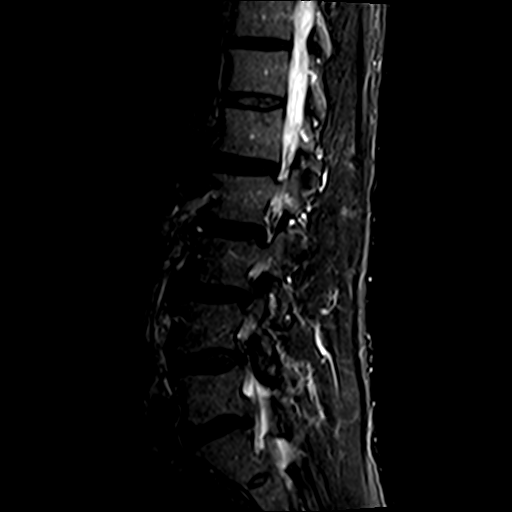
[im 12/12]
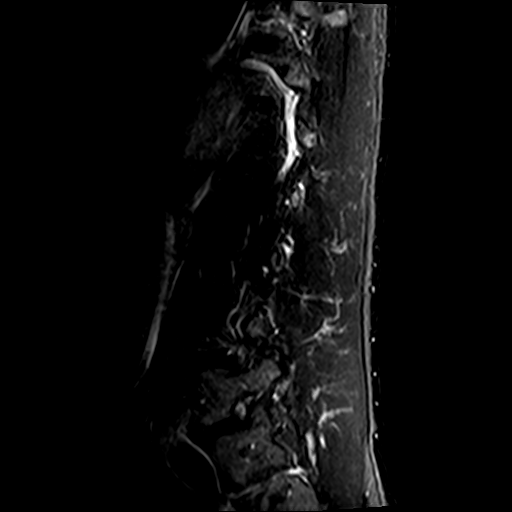

[Series 5: T1 · sagittal · 4.0mm · 0.88mm/px · 5 of 12 slices shown (1 of 2)]
[im 1/12]
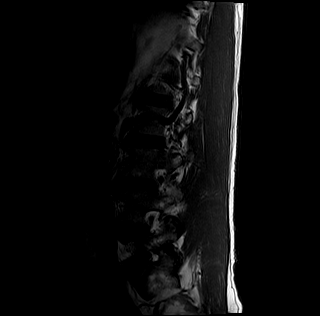
[im 3/12]
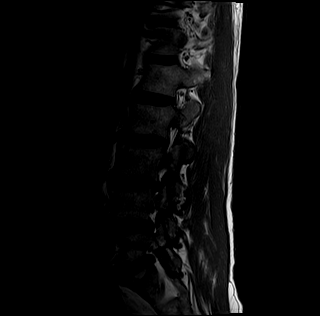
[im 6/12]
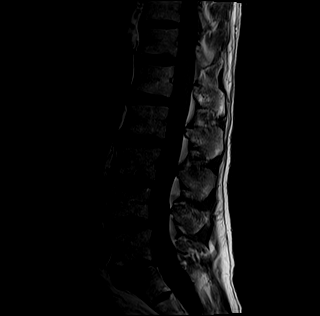
[im 9/12]
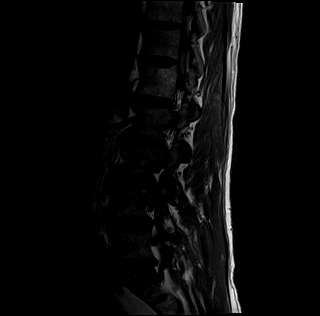
[im 12/12]
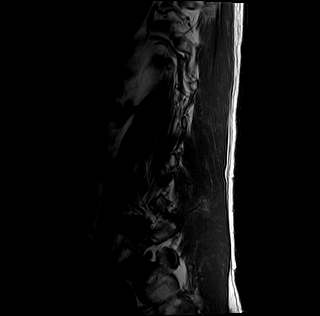

[Series 6: T2 · axial · 4.0mm · 0.70mm/px · z∈[-124,+55]mm · 14 of 35 slices shown (2 of 2)]
[im 1/35]
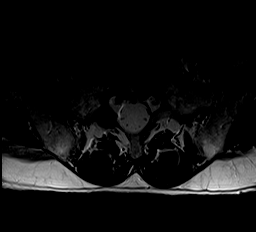
[im 3/35]
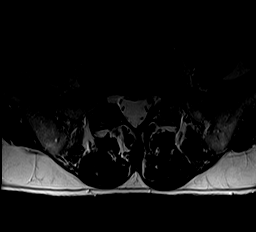
[im 5/35]
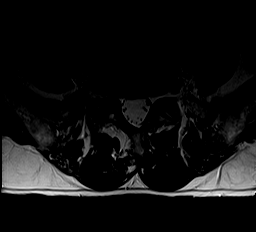
[im 7/35]
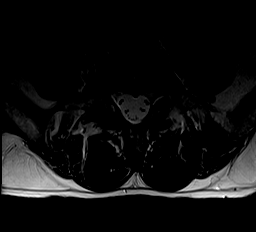
[im 10/35]
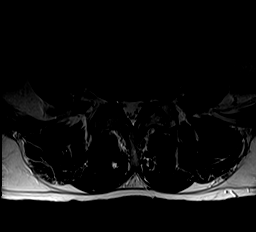
[im 12/35]
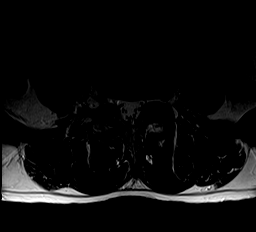
[im 14/35]
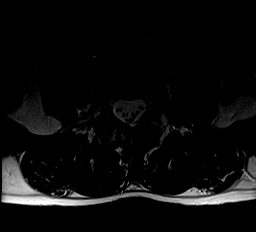
[im 16/35]
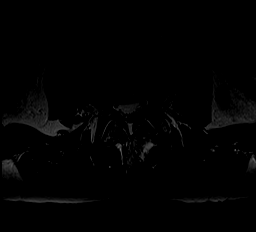
[im 19/35]
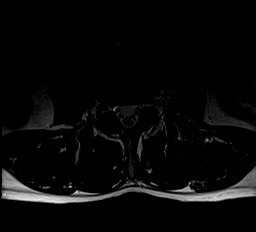
[im 21/35]
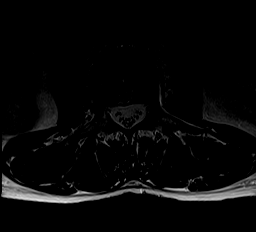
[im 23/35]
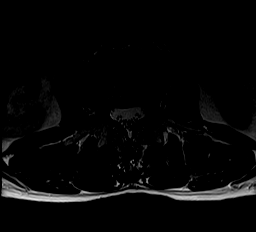
[im 25/35]
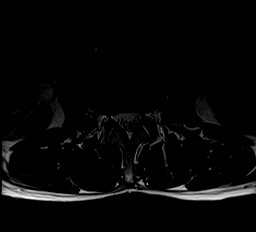
[im 30/35]
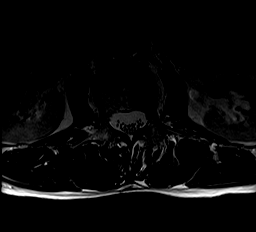
[im 35/35]
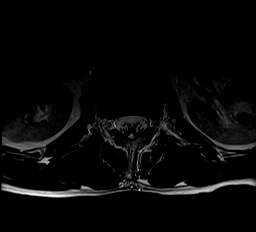

[Series 7: T1 · axial · 4.0mm · 0.47mm/px · z∈[-116,+55]mm · 10 of 35 slices shown (2 of 2)]
[im 3/35]
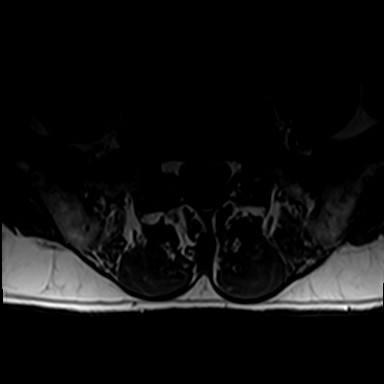
[im 5/35]
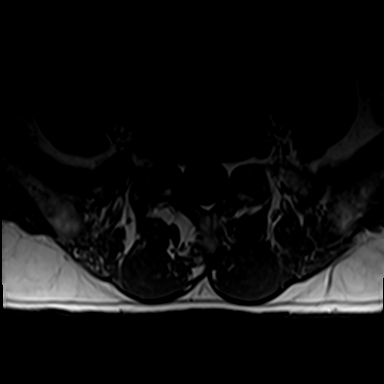
[im 7/35]
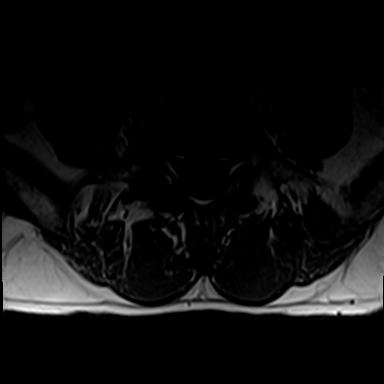
[im 12/35]
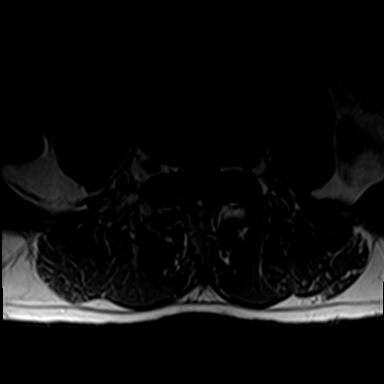
[im 16/35]
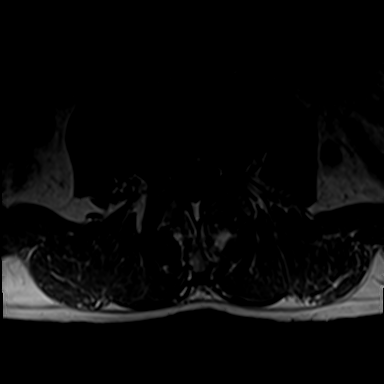
[im 19/35]
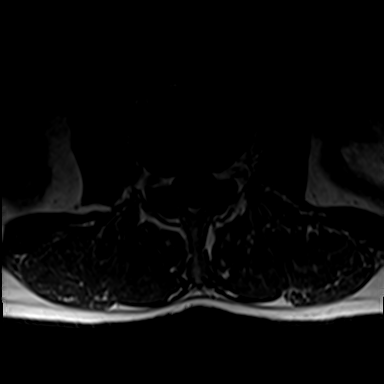
[im 21/35]
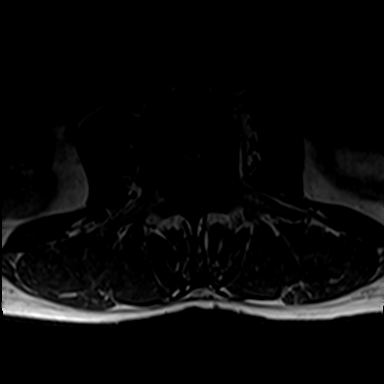
[im 25/35]
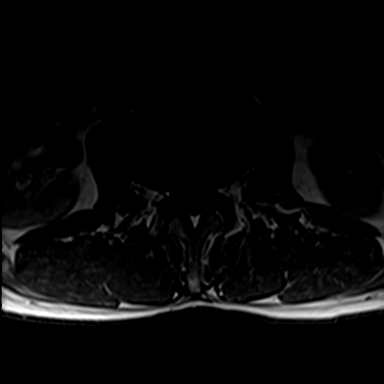
[im 30/35]
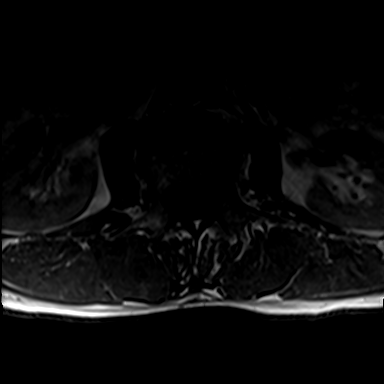
[im 35/35]
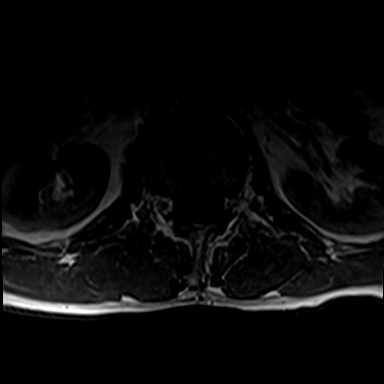

[40 of 48 positions shown; findings below may reference images not displayed]

FINDINGS: Segmentation:  Standard.

Alignment:  Physiologic.

Vertebrae:  No fracture, evidence of discitis, or bone lesion.

Conus medullaris: Extends to the L1-2 level and appears normal.

Paraspinal and other soft tissues: Negative.

Disc levels:

L1-2: No significant disc displacement, foraminal narrowing, or
canal stenosis.

L2-3: Small disc bulge. No significant foraminal narrowing or canal
stenosis.

L3-4: Small disc bulge with mild bilateral facet and ligamentum
flavum hypertrophy. Mild foraminal and lateral recess narrowing. No
significant canal stenosis.

L4-5: Small disc bulge with mild facet and ligamentum flavum
hypertrophy. Mild bilateral foraminal and lateral recess narrowing.
No significant canal stenosis.

L5-S1: Small disc bulge with moderate right mild left facet
hypertrophy and marginal endplate osteophytes in the right
extraforaminal zone. Moderate to severe right-sided foraminal
narrowing and mild left foraminal narrowing. No significant canal
stenosis.
IMPRESSION: 1. No acute osseous abnormality or significant canal stenosis.
2. Moderate to severe right-sided L5-S1 foraminal narrowing
secondary to disc and facet degenerative changes.
3. Otherwise mild foraminal and lateral recess narrowing from L3
through S1.

By: Damiam Dux M.D.

## 2017-08-10 ENCOUNTER — Ambulatory Visit (INDEPENDENT_AMBULATORY_CARE_PROVIDER_SITE_OTHER): Payer: Medicare Other

## 2017-08-10 ENCOUNTER — Ambulatory Visit (INDEPENDENT_AMBULATORY_CARE_PROVIDER_SITE_OTHER): Payer: Medicare Other | Admitting: Orthopaedic Surgery

## 2017-08-10 ENCOUNTER — Encounter (INDEPENDENT_AMBULATORY_CARE_PROVIDER_SITE_OTHER): Payer: Self-pay | Admitting: Orthopaedic Surgery

## 2017-08-10 VITALS — BP 130/74 | HR 45 | Ht 71.5 in | Wt 165.0 lb

## 2017-08-10 DIAGNOSIS — S63501A Unspecified sprain of right wrist, initial encounter: Secondary | ICD-10-CM | POA: Insufficient documentation

## 2017-08-10 DIAGNOSIS — S63501D Unspecified sprain of right wrist, subsequent encounter: Secondary | ICD-10-CM | POA: Diagnosis not present
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
## 2017-10-15 ENCOUNTER — Telehealth (INDEPENDENT_AMBULATORY_CARE_PROVIDER_SITE_OTHER): Payer: Self-pay | Admitting: Orthopaedic Surgery

## 2017-10-15 DIAGNOSIS — M47816 Spondylosis without myelopathy or radiculopathy, lumbar region: Secondary | ICD-10-CM

## 2017-10-15 NOTE — Telephone Encounter (Signed)
Patient [facility] requesting an injection from [facility], pt wasn't sure if he needed to see [name] first or if the order could just be sent to Dr Ernestina Patches. Call back # [phone]

## 2017-10-16 ENCOUNTER — Telehealth (INDEPENDENT_AMBULATORY_CARE_PROVIDER_SITE_OTHER): Payer: Self-pay | Admitting: *Deleted

## 2017-10-16 NOTE — Telephone Encounter (Signed)
Ok to see [facility]. thanks

## 2017-10-16 NOTE — Telephone Encounter (Signed)
Couple of weeks after if it was for follow up "back"

## 2017-10-16 NOTE — Telephone Encounter (Signed)
I spoke with patient. Appt with Jeneen Rinks cancelled and he will reschedule for a couple of weeks post injection.

## 2017-10-16 NOTE — Telephone Encounter (Signed)
Referral to Dr. Ernestina Patches entered.

## 2017-10-16 NOTE — Telephone Encounter (Signed)
Please advise 

## 2017-10-18 ENCOUNTER — Ambulatory Visit (INDEPENDENT_AMBULATORY_CARE_PROVIDER_SITE_OTHER): Payer: Medicare Other | Admitting: Surgery

## 2017-10-22 ENCOUNTER — Encounter (INDEPENDENT_AMBULATORY_CARE_PROVIDER_SITE_OTHER): Payer: Self-pay | Admitting: Family Medicine

## 2017-10-22 ENCOUNTER — Ambulatory Visit (INDEPENDENT_AMBULATORY_CARE_PROVIDER_SITE_OTHER): Payer: Medicare Other | Admitting: Family Medicine

## 2017-10-22 VITALS — BP 149/76 | HR 48 | Ht 71.5 in | Wt 165.0 lb

## 2017-10-22 DIAGNOSIS — M25531 Pain in right wrist: Secondary | ICD-10-CM | POA: Diagnosis not present

## 2017-10-22 MED ORDER — TRAMADOL HCL 50 MG PO TABS: 50.0000 mg | ORAL_TABLET | Freq: Two times a day (BID) | ORAL | 0 refills | Status: DC | PRN
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
## 2017-11-02 ENCOUNTER — Ambulatory Visit (INDEPENDENT_AMBULATORY_CARE_PROVIDER_SITE_OTHER): Payer: Medicare Other | Admitting: Physical Medicine and Rehabilitation

## 2017-11-02 ENCOUNTER — Encounter (INDEPENDENT_AMBULATORY_CARE_PROVIDER_SITE_OTHER): Payer: Self-pay | Admitting: Physical Medicine and Rehabilitation

## 2017-11-02 ENCOUNTER — Ambulatory Visit (INDEPENDENT_AMBULATORY_CARE_PROVIDER_SITE_OTHER): Payer: Self-pay

## 2017-11-02 VITALS — BP 137/88 | HR 51 | Temp 97.9°F

## 2017-11-02 DIAGNOSIS — M5416 Radiculopathy, lumbar region: Secondary | ICD-10-CM

## 2017-11-02 DIAGNOSIS — M48061 Spinal stenosis, lumbar region without neurogenic claudication: Secondary | ICD-10-CM

## 2017-11-02 MED ORDER — BETAMETHASONE SOD PHOS & ACET 6 (3-3) MG/ML IJ SUSP
12.0000 mg | Freq: Once | INTRAMUSCULAR | Status: AC
  Administered 2017-11-02: 12 mg

## 2017-11-02 NOTE — Procedures (Signed)
Lumbosacral Transforaminal Epidural Steroid Injection - Sub-Pedicular Approach with Fluoroscopic Guidance  Patient: Brendan Holland      Date of Birth: 06-Jul-1950 MRN: [id number] PCP: [name], MD      Visit Date: 11/02/2017   Universal Protocol:    Date/Time: 11/02/2017  Consent Given By: the patient  Position: PRONE  Additional Comments: Vital signs were monitored before and after the procedure. Patient was prepped and draped in the usual sterile fashion. The correct patient, procedure, and site was verified.   Injection Procedure Details:  Procedure Site One Meds Administered:  Meds ordered this encounter  Medications  . betamethasone acetate-betamethasone sodium phosphate (CELESTONE) injection 12 mg    Laterality: Right  Location/Site:  L5-S1  Needle size: 22 G  Needle type: Spinal  Needle Placement: Transforaminal  Findings:    -Comments: Excellent flow of contrast along the nerve and into the epidural space.  Procedure Details: After squaring off the end-plates to get a true AP view, the C-arm was positioned so that an oblique view of the foramen as noted above was visualized. The target area is just inferior to the "nose of the scotty dog" or sub pedicular. The soft tissues overlying this structure were infiltrated with 2-3 ml. of 1% Lidocaine without Epinephrine.  The spinal needle was inserted toward the target using a "trajectory" view along the fluoroscope beam.  Under AP and lateral visualization, the needle was advanced so it did not puncture dura and was located close the 6 O'Clock position of the pedical in AP tracterory. Biplanar projections were used to confirm position. Aspiration was confirmed to be negative for CSF and/or blood. A 1-2 ml. volume of Isovue-250 was injected and flow of contrast was noted at each level. Radiographs were obtained for documentation purposes.   After attaining the desired flow of contrast documented above, a 0.5 to  1.0 ml test dose of 0.25% Marcaine was injected into each respective transforaminal space.  The patient was observed for 90 seconds post injection.  After no sensory deficits were reported, and normal lower extremity motor function was noted,   the above injectate was administered so that equal amounts of the injectate were placed at each foramen (level) into the transforaminal epidural space.   Additional Comments:  The patient tolerated the procedure well Dressing: Band-Aid    Post-procedure details: Patient was observed during the procedure. Post-procedure instructions were reviewed.  Patient left the clinic in stable condition.

## 2017-11-02 NOTE — Progress Notes (Signed)
 .  Numeric Pain Rating Scale and Functional Assessment Average Pain 5   In the last MONTH (on 0-10 scale) has pain interfered with the following?  1. General activity like being  able to carry out your everyday physical activities such as walking, climbing stairs, carrying groceries, or moving a chair?  Rating(4)   +Driver, -BT, -Dye Allergies.  

## 2017-11-02 NOTE — Patient Instructions (Signed)

## 2017-11-13 NOTE — Progress Notes (Signed)
Brendan Holland - 67 y.o. male MRN [id number]  Date of birth: 1950-02-12  Office Visit Note: Visit Date: 11/02/2017 PCP: [name], MD Referred by: [name], MD  Subjective: Chief Complaint  Patient presents with  . Right Leg - Pain   HPI:  Brendan Holland is a 67 y.o. male who comes in today For repeat right L5 transforaminal epidural steroid injection at the request of Dr. Rodell Perna.  We saw him last June in 2018 with good with good relief with epidural injection.  His pain is been ongoing for a couple of years and started after a long run.  Over the last 5 to 6 weeks is really gotten worse.  No specific new injury.  ROS Otherwise per HPI.  Assessment & Plan: Visit Diagnoses:  1. Lumbar radiculopathy   2. Foraminal stenosis of lumbar region     Plan: No additional findings.   Meds & Orders:  Meds ordered this encounter  Medications  . betamethasone acetate-betamethasone sodium phosphate (CELESTONE) injection 12 mg    Orders Placed This Encounter  Procedures  . XR C-ARM NO REPORT  . Epidural Steroid injection    Follow-up: Return if symptoms worsen or fail to improve.   Procedures: No procedures performed  Lumbosacral Transforaminal Epidural Steroid Injection - Sub-Pedicular Approach with Fluoroscopic Guidance  Patient: Brendan Holland      Date of Birth: 1950/03/06 MRN: [id number] PCP: [name], MD      Visit Date: 11/02/2017   Universal Protocol:    Date/Time: 11/02/2017  Consent Given By: the patient  Position: PRONE  Additional Comments: Vital signs were monitored before and after the procedure. Patient was prepped and draped in the usual sterile fashion. The correct patient, procedure, and site was verified.   Injection Procedure Details:  Procedure Site One Meds Administered:  Meds ordered this encounter  Medications  . betamethasone acetate-betamethasone sodium phosphate (CELESTONE) injection 12 mg    Laterality:  Right  Location/Site:  L5-S1  Needle size: 22 G  Needle type: Spinal  Needle Placement: Transforaminal  Findings:    -Comments: Excellent flow of contrast along the nerve and into the epidural space.  Procedure Details: After squaring off the end-plates to get a true AP view, the C-arm was positioned so that an oblique view of the foramen as noted above was visualized. The target area is just inferior to the "nose of the scotty dog" or sub pedicular. The soft tissues overlying this structure were infiltrated with 2-3 ml. of 1% Lidocaine without Epinephrine.  The spinal needle was inserted toward the target using a "trajectory" view along the fluoroscope beam.  Under AP and lateral visualization, the needle was advanced so it did not puncture dura and was located close the 6 O'Clock position of the pedical in AP tracterory. Biplanar projections were used to confirm position. Aspiration was confirmed to be negative for CSF and/or blood. A 1-2 ml. volume of Isovue-250 was injected and flow of contrast was noted at each level. Radiographs were obtained for documentation purposes.   After attaining the desired flow of contrast documented above, a 0.5 to 1.0 ml test dose of 0.25% Marcaine was injected into each respective transforaminal space.  The patient was observed for 90 seconds post injection.  After no sensory deficits were reported, and normal lower extremity motor function was noted,   the above injectate was administered so that equal amounts of the injectate were placed at each foramen (level) into the transforaminal  epidural space.   Additional Comments:  The patient tolerated the procedure well Dressing: Band-Aid    Post-procedure details: Patient was observed during the procedure. Post-procedure instructions were reviewed.  Patient left the clinic in stable condition.     Clinical History: MRI LUMBAR SPINE WITHOUT CONTRAST  TECHNIQUE: Multiplanar, multisequence MR  imaging of the lumbar spine was performed. No intravenous contrast was administered.  COMPARISON:  None.  FINDINGS: Segmentation:  Standard.  Alignment:  Physiologic.  Vertebrae:  No fracture, evidence of discitis, or bone lesion.  Conus medullaris: Extends to the L1-2 level and appears normal.  Paraspinal and other soft tissues: Negative.  Disc levels:  L1-2: No significant disc displacement, foraminal narrowing, or canal stenosis.  L2-3: Small disc bulge. No significant foraminal narrowing or canal stenosis.  L3-4: Small disc bulge with mild bilateral facet and ligamentum flavum hypertrophy. Mild foraminal and lateral recess narrowing. No significant canal stenosis.  L4-5: Small disc bulge with mild facet and ligamentum flavum hypertrophy. Mild bilateral foraminal and lateral recess narrowing. No significant canal stenosis.  L5-S1: Small disc bulge with moderate right mild left facet hypertrophy and marginal endplate osteophytes in the right extraforaminal zone. Moderate to severe right-sided foraminal narrowing and mild left foraminal narrowing. No significant canal stenosis.  IMPRESSION: 1. No acute osseous abnormality or significant canal stenosis. 2. Moderate to severe right-sided L5-S1 foraminal narrowing secondary to disc and facet degenerative changes. 3. Otherwise mild foraminal and lateral recess narrowing from L3 through S1.   Electronically Signed   By: Kristine Garbe M.D.   On: 03/15/2016 16:06     Objective:  VS:  HT:    WT:   BMI:     BP:137/88  HR:(!) 51bpm  TEMP:97.9 F (36.6 C)(Oral)  RESP:  Physical Exam  Ortho Exam Imaging: No results found.

## 2017-11-20 ENCOUNTER — Ambulatory Visit (INDEPENDENT_AMBULATORY_CARE_PROVIDER_SITE_OTHER): Payer: Medicare Other | Admitting: Orthopaedic Surgery

## 2017-11-20 ENCOUNTER — Encounter (INDEPENDENT_AMBULATORY_CARE_PROVIDER_SITE_OTHER): Payer: Self-pay | Admitting: Orthopaedic Surgery

## 2017-11-20 VITALS — BP 141/75 | HR 44 | Ht 72.0 in | Wt 168.0 lb

## 2017-11-20 DIAGNOSIS — M1811 Unilateral primary osteoarthritis of first carpometacarpal joint, right hand: Secondary | ICD-10-CM | POA: Diagnosis not present
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
## 2017-12-01 ENCOUNTER — Other Ambulatory Visit (INDEPENDENT_AMBULATORY_CARE_PROVIDER_SITE_OTHER): Payer: Self-pay | Admitting: Family Medicine

## 2018-04-23 ENCOUNTER — Encounter (INDEPENDENT_AMBULATORY_CARE_PROVIDER_SITE_OTHER): Payer: Self-pay | Admitting: Orthopaedic Surgery

## 2018-04-23 ENCOUNTER — Other Ambulatory Visit: Payer: Self-pay

## 2018-04-23 ENCOUNTER — Ambulatory Visit (INDEPENDENT_AMBULATORY_CARE_PROVIDER_SITE_OTHER): Payer: Medicare Other

## 2018-04-23 ENCOUNTER — Ambulatory Visit (INDEPENDENT_AMBULATORY_CARE_PROVIDER_SITE_OTHER): Payer: Medicare Other | Admitting: Orthopaedic Surgery

## 2018-04-23 VITALS — Ht 72.0 in | Wt 166.0 lb

## 2018-04-23 DIAGNOSIS — M25561 Pain in right knee: Secondary | ICD-10-CM | POA: Diagnosis not present

## 2018-04-23 MED ORDER — LIDOCAINE HCL 1 % IJ SOLN
0.5000 mL | INTRAMUSCULAR | Status: AC | PRN
  Administered 2018-04-23: .5 mL

## 2018-04-23 MED ORDER — METHYLPREDNISOLONE ACETATE 40 MG/ML IJ SUSP
40.0000 mg | INTRAMUSCULAR | Status: AC | PRN
  Administered 2018-04-23: 40 mg via INTRA_ARTICULAR

## 2018-04-23 MED ORDER — BUPIVACAINE HCL 0.25 % IJ SOLN
4.0000 mL | INTRAMUSCULAR | Status: AC | PRN
  Administered 2018-04-23: 4 mL via INTRA_ARTICULAR
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
## 2018-04-24 ENCOUNTER — Telehealth (INDEPENDENT_AMBULATORY_CARE_PROVIDER_SITE_OTHER): Payer: Self-pay

## 2018-04-24 MED ORDER — TRAMADOL HCL 50 MG PO TABS: 50.0000 mg | ORAL_TABLET | Freq: Two times a day (BID) | ORAL | 0 refills | Status: DC | PRN

## 2018-04-24 NOTE — Addendum Note (Signed)
Addended by: Hortencia Pilar on: 04/24/2018 01:06 PM   Modules accepted: Orders

## 2018-04-24 NOTE — Telephone Encounter (Signed)
Rx sent 

## 2018-04-24 NOTE — Telephone Encounter (Signed)
Received fax from patients pharmacy on behalf of patient requesting rxrf for ultram 1 po bid prn  [facility]

## 2018-09-10 ENCOUNTER — Ambulatory Visit (INDEPENDENT_AMBULATORY_CARE_PROVIDER_SITE_OTHER): Payer: Medicare Other

## 2018-09-10 ENCOUNTER — Encounter: Payer: Self-pay | Admitting: Orthopaedic Surgery

## 2018-09-10 ENCOUNTER — Ambulatory Visit (INDEPENDENT_AMBULATORY_CARE_PROVIDER_SITE_OTHER): Payer: Medicare Other | Admitting: Orthopaedic Surgery

## 2018-09-10 ENCOUNTER — Other Ambulatory Visit (INDEPENDENT_AMBULATORY_CARE_PROVIDER_SITE_OTHER): Payer: Self-pay | Admitting: Family Medicine

## 2018-09-10 VITALS — BP 123/72 | HR 53 | Ht 72.0 in | Wt 165.0 lb

## 2018-09-10 DIAGNOSIS — G8929 Other chronic pain: Secondary | ICD-10-CM | POA: Diagnosis not present

## 2018-09-10 DIAGNOSIS — M545 Low back pain, unspecified: Secondary | ICD-10-CM
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
## 2018-09-17 ENCOUNTER — Other Ambulatory Visit: Payer: Self-pay | Admitting: Family Medicine

## 2018-10-02 ENCOUNTER — Encounter: Payer: Self-pay | Admitting: Physical Medicine and Rehabilitation

## 2018-10-02 ENCOUNTER — Ambulatory Visit: Payer: Self-pay

## 2018-10-02 ENCOUNTER — Ambulatory Visit (INDEPENDENT_AMBULATORY_CARE_PROVIDER_SITE_OTHER): Payer: Medicare Other | Admitting: Physical Medicine and Rehabilitation

## 2018-10-02 VITALS — BP 128/83 | HR 56

## 2018-10-02 DIAGNOSIS — M5416 Radiculopathy, lumbar region: Secondary | ICD-10-CM

## 2018-10-02 MED ORDER — BETAMETHASONE SOD PHOS & ACET 6 (3-3) MG/ML IJ SUSP
12.0000 mg | Freq: Once | INTRAMUSCULAR | Status: AC
  Administered 2018-10-02: 12 mg

## 2018-10-02 NOTE — Progress Notes (Signed)
 .  Numeric Pain Rating Scale and Functional Assessment Average Pain 8   In the last MONTH (on 0-10 scale) has pain interfered with the following?  1. General activity like being  able to carry out your everyday physical activities such as walking, climbing stairs, carrying groceries, or moving a chair?  Rating(7)   +Driver, -BT, -Dye Allergies.  

## 2018-10-20 NOTE — Progress Notes (Signed)
Brendan Holland - 68 y.o. male MRN [id number]  Date of birth: April 23, 1950  Office Visit Note: Visit Date: 10/02/2018 PCP: [name], MD Referred by: [name], MD  Subjective: No chief complaint on file.  HPI:  Brendan Holland is a 68 y.o. male who comes in today For planned repeat right L5 transforaminal epidural steroid injection at the request of Dr. Leana Gamer.  Prior injection in October of last year did fairly well for the patient for many months.  He has pretty classic L5 distribution symptoms.  No new findings today.  He will follow-up with Dr. Inda Merlin after injection.  ROS Otherwise per HPI.  Assessment & Plan: Visit Diagnoses:  1. Lumbar radiculopathy     Plan: No additional findings.   Meds & Orders:  Meds ordered this encounter  Medications  . betamethasone acetate-betamethasone sodium phosphate (CELESTONE) injection 12 mg    Orders Placed This Encounter  Procedures  . XR C-ARM NO REPORT  . Epidural Steroid injection    Follow-up: No follow-ups on file.   Procedures: No procedures performed  Lumbosacral Transforaminal Epidural Steroid Injection - Sub-Pedicular Approach with Fluoroscopic Guidance  Patient: Brendan Holland      Date of Birth: 17-May-1950 MRN: [id number] PCP: [name], MD      Visit Date: 10/02/2018   Universal Protocol:    Date/Time: 10/02/2018  Consent Given By: the patient  Position: PRONE  Additional Comments: Vital signs were monitored before and after the procedure. Patient was prepped and draped in the usual sterile fashion. The correct patient, procedure, and site was verified.   Injection Procedure Details:  Procedure Site One Meds Administered:  Meds ordered this encounter  Medications  . betamethasone acetate-betamethasone sodium phosphate (CELESTONE) injection 12 mg    Laterality: Right  Location/Site:  L5-S1  Needle size: 22 G  Needle type: Spinal  Needle Placement: Transforaminal  Findings:    -Comments: Excellent flow of contrast along the nerve and into the epidural space.  Procedure Details: After squaring off the end-plates to get a true AP view, the C-arm was positioned so that an oblique view of the foramen as noted above was visualized. The target area is just inferior to the "nose of the scotty dog" or sub pedicular. The soft tissues overlying this structure were infiltrated with 2-3 ml. of 1% Lidocaine without Epinephrine.  The spinal needle was inserted toward the target using a "trajectory" view along the fluoroscope beam.  Under AP and lateral visualization, the needle was advanced so it did not puncture dura and was located close the 6 O'Clock position of the pedical in AP tracterory. Biplanar projections were used to confirm position. Aspiration was confirmed to be negative for CSF and/or blood. A 1-2 ml. volume of Isovue-250 was injected and flow of contrast was noted at each level. Radiographs were obtained for documentation purposes.   After attaining the desired flow of contrast documented above, a 0.5 to 1.0 ml test dose of 0.25% Marcaine was injected into each respective transforaminal space.  The patient was observed for 90 seconds post injection.  After no sensory deficits were reported, and normal lower extremity motor function was noted,   the above injectate was administered so that equal amounts of the injectate were placed at each foramen (level) into the transforaminal epidural space.   Additional Comments:  The patient tolerated the procedure well Dressing: 2 x 2 sterile gauze and Band-Aid    Post-procedure details: Patient was observed during the  procedure. Post-procedure instructions were reviewed.  Patient left the clinic in stable condition.    Clinical History: MRI LUMBAR SPINE WITHOUT CONTRAST  TECHNIQUE: Multiplanar, multisequence MR imaging of the lumbar spine was performed. No intravenous contrast was administered.  COMPARISON:  None.   FINDINGS: Segmentation:  Standard.  Alignment:  Physiologic.  Vertebrae:  No fracture, evidence of discitis, or bone lesion.  Conus medullaris: Extends to the L1-2 level and appears normal.  Paraspinal and other soft tissues: Negative.  Disc levels:  L1-2: No significant disc displacement, foraminal narrowing, or canal stenosis.  L2-3: Small disc bulge. No significant foraminal narrowing or canal stenosis.  L3-4: Small disc bulge with mild bilateral facet and ligamentum flavum hypertrophy. Mild foraminal and lateral recess narrowing. No significant canal stenosis.  L4-5: Small disc bulge with mild facet and ligamentum flavum hypertrophy. Mild bilateral foraminal and lateral recess narrowing. No significant canal stenosis.  L5-S1: Small disc bulge with moderate right mild left facet hypertrophy and marginal endplate osteophytes in the right extraforaminal zone. Moderate to severe right-sided foraminal narrowing and mild left foraminal narrowing. No significant canal stenosis.  IMPRESSION: 1. No acute osseous abnormality or significant canal stenosis. 2. Moderate to severe right-sided L5-S1 foraminal narrowing secondary to disc and facet degenerative changes. 3. Otherwise mild foraminal and lateral recess narrowing from L3 through S1.   Electronically Signed   By: Kristine Garbe M.D.   On: 03/15/2016 16:06     Objective:  VS:  HT:    WT:   BMI:     BP:128/83  HR:(!) 56bpm  TEMP: ( )  RESP:  Physical Exam  Ortho Exam Imaging: No results found.

## 2018-10-20 NOTE — Procedures (Signed)
Lumbosacral Transforaminal Epidural Steroid Injection - Sub-Pedicular Approach with Fluoroscopic Guidance  Patient: Brendan Holland      Date of Birth: 1950-08-12 MRN: [id number] PCP: [name], MD      Visit Date: 10/02/2018   Universal Protocol:    Date/Time: 10/02/2018  Consent Given By: the patient  Position: PRONE  Additional Comments: Vital signs were monitored before and after the procedure. Patient was prepped and draped in the usual sterile fashion. The correct patient, procedure, and site was verified.   Injection Procedure Details:  Procedure Site One Meds Administered:  Meds ordered this encounter  Medications  . betamethasone acetate-betamethasone sodium phosphate (CELESTONE) injection 12 mg    Laterality: Right  Location/Site:  L5-S1  Needle size: 22 G  Needle type: Spinal  Needle Placement: Transforaminal  Findings:    -Comments: Excellent flow of contrast along the nerve and into the epidural space.  Procedure Details: After squaring off the end-plates to get a true AP view, the C-arm was positioned so that an oblique view of the foramen as noted above was visualized. The target area is just inferior to the "nose of the scotty dog" or sub pedicular. The soft tissues overlying this structure were infiltrated with 2-3 ml. of 1% Lidocaine without Epinephrine.  The spinal needle was inserted toward the target using a "trajectory" view along the fluoroscope beam.  Under AP and lateral visualization, the needle was advanced so it did not puncture dura and was located close the 6 O'Clock position of the pedical in AP tracterory. Biplanar projections were used to confirm position. Aspiration was confirmed to be negative for CSF and/or blood. A 1-2 ml. volume of Isovue-250 was injected and flow of contrast was noted at each level. Radiographs were obtained for documentation purposes.   After attaining the desired flow of contrast documented above, a 0.5 to  1.0 ml test dose of 0.25% Marcaine was injected into each respective transforaminal space.  The patient was observed for 90 seconds post injection.  After no sensory deficits were reported, and normal lower extremity motor function was noted,   the above injectate was administered so that equal amounts of the injectate were placed at each foramen (level) into the transforaminal epidural space.   Additional Comments:  The patient tolerated the procedure well Dressing: 2 x 2 sterile gauze and Band-Aid    Post-procedure details: Patient was observed during the procedure. Post-procedure instructions were reviewed.  Patient left the clinic in stable condition.

## 2018-11-07 ENCOUNTER — Ambulatory Visit: Payer: Self-pay

## 2018-11-07 ENCOUNTER — Other Ambulatory Visit: Payer: Self-pay

## 2018-11-07 ENCOUNTER — Encounter: Payer: Self-pay | Admitting: Surgery

## 2018-11-07 ENCOUNTER — Ambulatory Visit (INDEPENDENT_AMBULATORY_CARE_PROVIDER_SITE_OTHER): Payer: Medicare Other | Admitting: Surgery

## 2018-11-07 VITALS — Ht 72.0 in | Wt 165.0 lb

## 2018-11-07 DIAGNOSIS — M25561 Pain in right knee: Secondary | ICD-10-CM

## 2018-11-07 MED ORDER — BUPIVACAINE HCL 0.25 % IJ SOLN
6.0000 mL | INTRAMUSCULAR | Status: AC | PRN
  Administered 2018-11-07: 6 mL via INTRA_ARTICULAR

## 2018-11-07 MED ORDER — LIDOCAINE HCL 1 % IJ SOLN
3.0000 mL | INTRAMUSCULAR | Status: AC | PRN
  Administered 2018-11-07: 3 mL

## 2018-11-07 MED ORDER — METHYLPREDNISOLONE ACETATE 40 MG/ML IJ SUSP
80.0000 mg | INTRAMUSCULAR | Status: AC | PRN
  Administered 2018-11-07: 80 mg via INTRA_ARTICULAR
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
## 2018-11-13 ENCOUNTER — Encounter: Payer: Self-pay | Admitting: Orthopaedic Surgery

## 2018-11-13 DIAGNOSIS — G8929 Other chronic pain: Secondary | ICD-10-CM

## 2018-11-13 DIAGNOSIS — M25561 Pain in right knee: Secondary | ICD-10-CM

## 2018-11-18 ENCOUNTER — Other Ambulatory Visit: Payer: Self-pay

## 2018-11-18 ENCOUNTER — Ambulatory Visit
Admission: RE | Admit: 2018-11-18 | Discharge: 2018-11-18 | Disposition: A | Discharge status: Home / Self Care | Payer: Medicare Other | Source: Ambulatory Visit | Attending: Surgery | Admitting: Surgery

## 2018-11-18 DIAGNOSIS — G8929 Other chronic pain: Secondary | ICD-10-CM

## 2018-11-28 ENCOUNTER — Encounter: Payer: Self-pay | Admitting: Orthopaedic Surgery

## 2018-11-28 ENCOUNTER — Ambulatory Visit (INDEPENDENT_AMBULATORY_CARE_PROVIDER_SITE_OTHER): Payer: Medicare Other | Admitting: Orthopaedic Surgery

## 2018-11-28 ENCOUNTER — Ambulatory Visit: Payer: Medicare Other | Admitting: Surgery

## 2018-11-28 DIAGNOSIS — M2391 Unspecified internal derangement of right knee: Secondary | ICD-10-CM
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
## 2018-12-03 ENCOUNTER — Other Ambulatory Visit (INDEPENDENT_AMBULATORY_CARE_PROVIDER_SITE_OTHER): Payer: Self-pay | Admitting: Family Medicine

## 2018-12-10 ENCOUNTER — Ambulatory Visit: Payer: Medicare Other | Admitting: Orthopaedic Surgery

## 2018-12-16 ENCOUNTER — Encounter: Payer: Self-pay | Admitting: Orthopaedic Surgery

## 2018-12-16 ENCOUNTER — Other Ambulatory Visit: Payer: Self-pay | Admitting: Surgery

## 2018-12-16 DIAGNOSIS — S83281A Other tear of lateral meniscus, current injury, right knee, initial encounter: Secondary | ICD-10-CM | POA: Diagnosis not present

## 2018-12-16 MED ORDER — HYDROCODONE-ACETAMINOPHEN 5-325 MG PO TABS: 1.0000 | ORAL_TABLET | Freq: Four times a day (QID) | ORAL | 0 refills | Status: DC | PRN

## 2018-12-25 ENCOUNTER — Inpatient Hospital Stay: Payer: Medicare Other | Admitting: Orthopedic Surgery

## 2018-12-25 ENCOUNTER — Encounter: Payer: Self-pay | Admitting: Orthopaedic Surgery

## 2018-12-25 ENCOUNTER — Other Ambulatory Visit: Payer: Self-pay

## 2018-12-25 ENCOUNTER — Ambulatory Visit (INDEPENDENT_AMBULATORY_CARE_PROVIDER_SITE_OTHER): Payer: Medicare Other | Admitting: Orthopaedic Surgery

## 2018-12-25 VITALS — BP 136/73 | HR 58 | Ht 72.0 in | Wt 164.0 lb

## 2018-12-25 DIAGNOSIS — M2391 Unspecified internal derangement of right knee: Secondary | ICD-10-CM

## 2018-12-25 NOTE — Progress Notes (Signed)
Postop knee arthroscopy.  Swelling is mild use anti-inflammatories elevation and ice.  He will work on range of motion we discussed the changes on the photograph and partial lateral meniscectomy.  Chondroplasty done patellofemoral joint revealed grade 3 changes in the trochlear groove.  He can work on range of motion continue his Aleve he still has some tramadol left.  I will recheck him again in 5 weeks.  I do not think he needs formal physical therapy.

## 2019-02-05 ENCOUNTER — Ambulatory Visit (INDEPENDENT_AMBULATORY_CARE_PROVIDER_SITE_OTHER): Payer: Medicare Other | Admitting: Orthopaedic Surgery

## 2019-02-05 ENCOUNTER — Other Ambulatory Visit (INDEPENDENT_AMBULATORY_CARE_PROVIDER_SITE_OTHER): Payer: Self-pay | Admitting: Family Medicine

## 2019-02-05 ENCOUNTER — Other Ambulatory Visit: Payer: Self-pay

## 2019-02-05 ENCOUNTER — Encounter: Payer: Self-pay | Admitting: Orthopaedic Surgery

## 2019-02-05 VITALS — BP 157/76 | HR 70 | Ht 72.0 in | Wt 170.0 lb

## 2019-02-05 DIAGNOSIS — M2391 Unspecified internal derangement of right knee: Secondary | ICD-10-CM
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
## 2019-03-11 ENCOUNTER — Encounter: Payer: Self-pay | Admitting: Orthopaedic Surgery

## 2019-03-11 ENCOUNTER — Ambulatory Visit (INDEPENDENT_AMBULATORY_CARE_PROVIDER_SITE_OTHER): Payer: Medicare Other | Admitting: Orthopaedic Surgery

## 2019-03-11 ENCOUNTER — Other Ambulatory Visit: Payer: Self-pay

## 2019-03-11 DIAGNOSIS — M1711 Unilateral primary osteoarthritis, right knee: Secondary | ICD-10-CM | POA: Insufficient documentation
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
## 2019-03-12 ENCOUNTER — Other Ambulatory Visit (INDEPENDENT_AMBULATORY_CARE_PROVIDER_SITE_OTHER): Payer: Self-pay | Admitting: Family Medicine

## 2019-03-17 ENCOUNTER — Other Ambulatory Visit: Payer: Self-pay | Admitting: Radiology

## 2019-03-17 MED ORDER — TRAMADOL HCL 50 MG PO TABS: 50.0000 mg | ORAL_TABLET | Freq: Two times a day (BID) | ORAL | 0 refills | Status: DC | PRN

## 2019-04-29 ENCOUNTER — Other Ambulatory Visit: Payer: Self-pay | Admitting: Family Medicine

## 2019-05-17 ENCOUNTER — Other Ambulatory Visit: Payer: Self-pay | Admitting: Family Medicine

## 2019-06-05 ENCOUNTER — Other Ambulatory Visit: Payer: Self-pay

## 2019-06-05 ENCOUNTER — Encounter: Payer: Self-pay | Admitting: Orthopaedic Surgery

## 2019-06-05 ENCOUNTER — Ambulatory Visit (INDEPENDENT_AMBULATORY_CARE_PROVIDER_SITE_OTHER): Payer: Medicare Other | Admitting: Orthopaedic Surgery

## 2019-06-05 VITALS — BP 129/65 | HR 61 | Ht 72.0 in | Wt 172.0 lb

## 2019-06-05 DIAGNOSIS — M1711 Unilateral primary osteoarthritis, right knee: Secondary | ICD-10-CM | POA: Diagnosis not present

## 2019-06-05 MED ORDER — METHYLPREDNISOLONE ACETATE 40 MG/ML IJ SUSP
40.0000 mg | INTRAMUSCULAR | Status: AC | PRN
  Administered 2019-06-05: 40 mg via INTRA_ARTICULAR

## 2019-06-05 MED ORDER — BUPIVACAINE HCL 0.25 % IJ SOLN
4.0000 mL | INTRAMUSCULAR | Status: AC | PRN
Start: 2019-06-05 — End: 2019-06-05
  Administered 2019-06-05: 4 mL via INTRA_ARTICULAR

## 2019-06-05 MED ORDER — LIDOCAINE HCL 1 % IJ SOLN
0.5000 mL | INTRAMUSCULAR | Status: AC | PRN
  Administered 2019-06-05: .5 mL
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
## 2019-06-10 ENCOUNTER — Ambulatory Visit: Payer: Medicare Other | Admitting: Orthopaedic Surgery

## 2019-07-08 ENCOUNTER — Other Ambulatory Visit: Payer: Self-pay | Admitting: Family Medicine

## 2019-07-08 MED ORDER — TRAMADOL HCL 50 MG PO TABS: 50.0000 mg | ORAL_TABLET | Freq: Two times a day (BID) | ORAL | 0 refills | Status: DC | PRN

## 2019-07-23 ENCOUNTER — Ambulatory Visit (INDEPENDENT_AMBULATORY_CARE_PROVIDER_SITE_OTHER): Payer: Medicare Other | Admitting: Orthopaedic Surgery

## 2019-07-23 ENCOUNTER — Ambulatory Visit (INDEPENDENT_AMBULATORY_CARE_PROVIDER_SITE_OTHER): Payer: Medicare Other

## 2019-07-23 ENCOUNTER — Encounter: Payer: Self-pay | Admitting: Orthopaedic Surgery

## 2019-07-23 VITALS — BP 139/81 | HR 54 | Ht 72.0 in | Wt 170.0 lb

## 2019-07-23 DIAGNOSIS — M1711 Unilateral primary osteoarthritis, right knee: Secondary | ICD-10-CM

## 2019-07-23 DIAGNOSIS — M25511 Pain in right shoulder: Secondary | ICD-10-CM
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
## 2019-07-24 ENCOUNTER — Other Ambulatory Visit: Payer: Self-pay | Admitting: Family Medicine

## 2019-08-07 ENCOUNTER — Encounter: Payer: Self-pay | Admitting: Surgery

## 2019-08-07 ENCOUNTER — Ambulatory Visit (INDEPENDENT_AMBULATORY_CARE_PROVIDER_SITE_OTHER): Payer: Medicare Other | Admitting: Surgery

## 2019-08-07 ENCOUNTER — Ambulatory Visit: Payer: Self-pay

## 2019-08-07 VITALS — BP 116/72 | HR 62 | Ht 72.0 in | Wt 170.0 lb

## 2019-08-07 DIAGNOSIS — M25561 Pain in right knee: Secondary | ICD-10-CM

## 2019-08-07 DIAGNOSIS — G8929 Other chronic pain: Secondary | ICD-10-CM | POA: Diagnosis not present

## 2019-08-07 DIAGNOSIS — M7541 Impingement syndrome of right shoulder: Secondary | ICD-10-CM

## 2019-08-07 MED ORDER — BUPIVACAINE HCL 0.25 % IJ SOLN
4.0000 mL | INTRAMUSCULAR | Status: AC | PRN
  Administered 2019-08-07: 4 mL via INTRA_ARTICULAR

## 2019-08-07 MED ORDER — LIDOCAINE HCL 1 % IJ SOLN
3.0000 mL | INTRAMUSCULAR | Status: AC | PRN
  Administered 2019-08-07: 3 mL

## 2019-08-07 MED ORDER — METHYLPREDNISOLONE ACETATE 40 MG/ML IJ SUSP
40.0000 mg | INTRAMUSCULAR | Status: AC | PRN
  Administered 2019-08-07: 40 mg via INTRA_ARTICULAR
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
## 2019-08-11 ENCOUNTER — Other Ambulatory Visit: Payer: Self-pay | Admitting: Family Medicine

## 2019-08-25 ENCOUNTER — Other Ambulatory Visit: Payer: Self-pay

## 2019-08-29 ENCOUNTER — Ambulatory Visit (INDEPENDENT_AMBULATORY_CARE_PROVIDER_SITE_OTHER): Payer: Medicare Other | Admitting: Surgery

## 2019-08-29 ENCOUNTER — Encounter: Payer: Self-pay | Admitting: Surgery

## 2019-08-29 DIAGNOSIS — M1711 Unilateral primary osteoarthritis, right knee: Secondary | ICD-10-CM

## 2019-08-29 DIAGNOSIS — G8929 Other chronic pain: Secondary | ICD-10-CM

## 2019-08-29 DIAGNOSIS — M25561 Pain in right knee: Secondary | ICD-10-CM

## 2019-08-29 NOTE — [place] Notes (Addendum)
COVID Vaccine Completed: x2 Date COVID Vaccine completed:  02/2019 COVID vaccine manufacturer: Pfizer    Moderna   [employer]   PCP - [name], MD Cardiologist - Saw cardiology 07-10-19 in Care Everywhere  Clearance on chart from Dr. Julien Nordmann  Chest x-ray - 06-19-2019 On Chart EKG - 06-05-2019 On Chart Stress Test - 07-10-2019 Care Everywhere ECHO - 07-10-2019 On Chart Cardiac Cath -   Sleep Study - 07-24-2019 in Care Everywhere CPAP - No  Fasting Blood Sugar - N/A Checks Blood Sugar _____ times a day  Blood Thinner Instructions: Aspirin Instructions:  ASA 81 mg.  Hold 3 to 5 days prior to surgey Last Dose: 09-03-19   Anesthesia review:  Stroke in 2011 no deficits, sleep apnea (neg recent sleep study), bradycardia (avid runner).  Abnormal EKG  Patient denies shortness of breath, fever, cough and chest pain at PAT appointment   Patient verbalized understanding of instructions that were given to them at the PAT appointment. Patient was also instructed that they will need to review over the PAT instructions again at home before surgery.

## 2019-08-29 NOTE — Patient Instructions (Addendum)
DUE TO COVID-19 ONLY ONE VISITOR IS ALLOWED TO COME WITH YOU AND STAY IN THE WAITING ROOM ONLY  DURING PRE OP AND  PROCEDURE.   IF YOU WILL BE ADMITTED INTO THE HOSPITAL YOU ARE ALLOWED ONE SUPPORT PERSON DURING VISITATION  HOURS ONLY (10AM -8PM)   . The support person may change daily. . The support person must pass our screening, gel in and out, and wear a mask at all times, including in the patient's room. . Patients must also wear a mask when staff or their support person are in the room.   COVID SWAB TESTING MUST BE COMPLETED ON:  Thursday, 09-04-2019 @ 10:50 AM    [address] [place], [place] [address]  (Must self quarantine after testing. Follow instructions on handout.)        Your procedure is scheduled on:  Monday, 09-08-2019   Report to [facility] Main  Entrance    Report to admitting at 10:15  AM   Call this number if you have problems the morning of surgery [phone]   Do not eat food :After Midnight.   May have liquids until  9:45 AM  day of surgery  CLEAR LIQUID DIET  Foods Allowed                                                                     Foods Excluded  Water, Black Coffee and tea, regular and decaf            liquids that you cannot  Plain Jell-O in any flavor  (No red)                                   see through such as: Fruit ices (not with fruit pulp)                                      milk, soups, orange juice              Iced Popsicles (No red)                                      All solid food                                   Apple juices Sports drinks like Gatorade (No red) Lightly seasoned clear broth or consume(fat free) Sugar, honey syrup    Complete one Ensure drink the morning of surgery at 9:45 AM the day of surgery.    Oral Hygiene is also important to reduce your risk of infection.                                     Remember - BRUSH YOUR TEETH THE MORNING OF SURGERY WITH YOUR REGULAR TOOTHPASTE   Do NOT  smoke after Midnight   Take  these medicines the morning of surgery with A SIP OF WATER:  Omeprazole, Simvastatin, Tramadol                                You may not have any metal on your body including jewelry, and body piercings             Do not wear lotions, powders, perfumes/cologne, or deodorant             Men may shave face and neck.   Do not bring valuables to the hospital. [name]   Contacts, dentures or bridgework may not be worn into surgery.   Bring small overnight bag day of surgery.    Patients discharged the day of surgery will not be allowed to drive home.                Please read over the following fact sheets you were given: IF YOU HAVE QUESTIONS ABOUT YOUR PRE OP INSTRUCTIONS  PLEASE CALL  [phone]   [facility] - Preparing for Surgery Before surgery, you can play an important role.  Because skin is not sterile, your skin needs to be as free of germs as possible.  You can reduce the number of germs on your skin by washing with CHG (chlorahexidine gluconate) soap before surgery.  CHG is an antiseptic cleaner which kills germs and bonds with the skin to continue killing germs even after washing. Please DO NOT use if you have an allergy to CHG or antibacterial soaps.  If your skin becomes reddened/irritated stop using the CHG and inform your nurse when you arrive at Short Stay. Do not shave (including legs and underarms) for at least 48 hours prior to the first CHG shower.  You may shave your face/neck.  Please follow these instructions carefully:  1.  Shower with CHG Soap the night before surgery and the  morning of surgery.  2.  If you choose to wash your hair, wash your hair first as usual with your normal  shampoo.  3.  After you shampoo, rinse your hair and body thoroughly to remove the shampoo.                             4.  Use CHG as you would any other liquid soap.  You can apply chg directly to the skin and  wash.  Gently with a scrungie or clean washcloth.  5.  Apply the CHG Soap to your body ONLY FROM THE NECK DOWN.   Do   not use on face/ open                           Wound or open sores. Avoid contact with eyes, ears mouth and   genitals (private parts).                       Wash face,  Genitals (private parts) with your normal soap.             6.  Wash thoroughly, paying special attention to the area where your    surgery  will be performed.  7.  Thoroughly rinse your body with warm water from the neck down.  8.  DO NOT shower/wash with your normal soap after  using and rinsing off the CHG Soap.                9.  Pat yourself dry with a clean towel.            10.  Wear clean pajamas.            11.  Place clean sheets on your bed the night of your first shower and do not  sleep with pets. Day of Surgery : Do not apply any lotions/deodorants the morning of surgery.  Please wear clean clothes to the hospital/surgery center.  FAILURE TO FOLLOW THESE INSTRUCTIONS MAY RESULT IN THE CANCELLATION OF YOUR SURGERY  PATIENT SIGNATURE_________________________________  NURSE SIGNATURE__________________________________  ________________________________________________________________________   Brendan [place]  An incentive spirometer is a tool that can help keep your lungs clear and active. This tool measures how well you are filling your lungs with each breath. Taking long deep breaths may help reverse or decrease the chance of developing breathing (pulmonary) problems (especially infection) following:  A long period of time when you are unable to move or be active. BEFORE THE PROCEDURE   If the spirometer includes an indicator to show your best effort, your nurse or respiratory therapist will set it to a desired goal.  If possible, sit up straight or lean slightly forward. Try not to slouch.  Hold the incentive spirometer in an upright position. INSTRUCTIONS FOR USE  1. Sit on the  edge of your bed if possible, or sit up as far as you can in bed or on a chair. 2. Hold the incentive spirometer in an upright position. 3. Breathe out normally. 4. Place the mouthpiece in your mouth and seal your lips tightly around it. 5. Breathe in slowly and as deeply as possible, raising the piston or the ball toward the top of the column. 6. Hold your breath for 3-5 seconds or for as long as possible. Allow the piston or ball to fall to the bottom of the column. 7. Remove the mouthpiece from your mouth and breathe out normally. 8. Rest for a few seconds and repeat Steps 1 through 7 at least 10 times every 1-2 hours when you are awake. Take your time and take a few normal breaths between deep breaths. 9. The spirometer may include an indicator to show your best effort. Use the indicator as a goal to work toward during each repetition. 10. After each set of 10 deep breaths, practice coughing to be sure your lungs are clear. If you have an incision (the cut made at the time of surgery), support your incision when coughing by placing a pillow or rolled up towels firmly against it. Once you are able to get out of bed, walk around indoors and cough well. You may stop using the incentive spirometer when instructed by your caregiver.  RISKS AND COMPLICATIONS  Take your time so you do not get dizzy or light-headed.  If you are in pain, you may need to take or ask for pain medication before doing incentive spirometry. It is harder to take a deep breath if you are having pain. AFTER USE  Rest and breathe slowly and easily.  It can be helpful to keep track of a log of your progress. Your caregiver can provide you with a simple table to help with this. If you are using the spirometer at home, follow these instructions: [place] IF:   You are having difficultly using the spirometer.  You have trouble  using the spirometer as often as instructed.  Your pain medication is not giving enough  relief while using the spirometer.  You develop fever of 100.5 F (38.1 C) or higher. SEEK IMMEDIATE MEDICAL CARE IF:   You cough up bloody sputum that had not been present before.  You develop fever of 102 F (38.9 C) or greater.  You develop worsening pain at or near the incision site. MAKE SURE YOU:   Understand these instructions.  Will watch your condition.  Will get help right away if you are not doing well or get worse. Document Released: 05/15/2006 Document Revised: 03/27/2011 Document Reviewed: 07/16/2006 [facility] Patient Information 2014 [place], [place].   ________________________________________________________________________

## 2019-08-29 NOTE — [place] Notes (Signed)
Please enter orders for PAT visit 09-04-19.  Surgery Scheduled for 09-08-19

## 2019-08-30 ENCOUNTER — Other Ambulatory Visit: Payer: Self-pay | Admitting: Family Medicine

## 2019-09-02 ENCOUNTER — Inpatient Hospital Stay (HOSPITAL_COMMUNITY)
Admission: RE | Admit: 2019-09-02 | Discharge: 2019-09-02 | Disposition: A | Discharge status: Home / Self Care | Payer: Medicare Other | Source: Ambulatory Visit
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
## 2019-09-04 ENCOUNTER — Other Ambulatory Visit: Payer: Self-pay

## 2019-09-04 ENCOUNTER — Encounter (HOSPITAL_COMMUNITY): Payer: Self-pay

## 2019-09-04 ENCOUNTER — Other Ambulatory Visit (HOSPITAL_COMMUNITY): Payer: Medicare Other

## 2019-09-04 ENCOUNTER — Other Ambulatory Visit (HOSPITAL_COMMUNITY)
Admission: RE | Admit: 2019-09-04 | Discharge: 2019-09-04 | Disposition: A | Discharge status: Home / Self Care | Payer: Medicare Other | Source: Ambulatory Visit | Attending: Orthopaedic Surgery | Admitting: Orthopaedic Surgery

## 2019-09-04 ENCOUNTER — Encounter (HOSPITAL_COMMUNITY)
Admission: RE | Admit: 2019-09-04 | Discharge: 2019-09-04 | Disposition: A | Discharge status: Home / Self Care | Payer: Medicare Other | Source: Ambulatory Visit | Attending: Orthopaedic Surgery | Admitting: Orthopaedic Surgery

## 2019-09-04 DIAGNOSIS — M1711 Unilateral primary osteoarthritis, right knee: Secondary | ICD-10-CM | POA: Diagnosis not present

## 2019-09-04 DIAGNOSIS — Z01812 Encounter for preprocedural laboratory examination: Secondary | ICD-10-CM | POA: Insufficient documentation

## 2019-09-04 DIAGNOSIS — Z20822 Contact with and (suspected) exposure to covid-19: Secondary | ICD-10-CM | POA: Insufficient documentation

## 2019-09-04 HISTORY — DX: Acute embolism and thrombosis of unspecified deep veins of unspecified lower extremity: I82.409

## 2019-09-04 LAB — COMPREHENSIVE METABOLIC PANEL
ALT: 25 U/L (ref 0–44)
AST: 22 U/L (ref 15–41)
Albumin: 3.8 g/dL (ref 3.5–5.0)
Alkaline Phosphatase: 49 U/L (ref 38–126)
Anion gap: 5 (ref 5–15)
BUN: 14 mg/dL (ref 8–23)
CO2: 30 mmol/L (ref 22–32)
Calcium: 9.4 mg/dL (ref 8.9–10.3)
Chloride: 104 mmol/L (ref 98–111)
Creatinine, Ser: 1.02 mg/dL (ref 0.61–1.24)
GFR calc Af Amer: >60 mL/min (ref >60)
GFR calc non Af Amer: >60 mL/min (ref >60)
Glucose, Bld: 85 mg/dL (ref 70–99)
Potassium: 4.6 mmol/L (ref 3.5–5.1)
Sodium: 139 mmol/L (ref 135–145)
Total Bilirubin: 1.1 mg/dL (ref 0.3–1.2)
Total Protein: 7.4 g/dL (ref 6.5–8.1)

## 2019-09-04 LAB — URINALYSIS, ROUTINE W REFLEX MICROSCOPIC
Bacteria, UA: NONE SEEN
Bilirubin Urine: NEGATIVE
Glucose, UA: NEGATIVE mg/dL
Ketones, ur: NEGATIVE mg/dL
Leukocytes,Ua: NEGATIVE
Nitrite: NEGATIVE
Protein, ur: NEGATIVE mg/dL
Specific Gravity, Urine: 1.004 — ABNORMAL LOW (ref 1.005–1.030)
pH: 6 (ref 5.0–8.0)

## 2019-09-04 LAB — CBC
HCT: 43.2 % (ref 39.0–52.0)
Hemoglobin: 14.9 g/dL (ref 13.0–17.0)
MCH: 33.2 pg (ref 26.0–34.0)
MCHC: 34.5 g/dL (ref 30.0–36.0)
MCV: 96.2 fL (ref 80.0–100.0)
Platelets: 148 10*3/uL — ABNORMAL LOW (ref 150–400)
RBC: 4.49 MIL/uL (ref 4.22–5.81)
RDW: 12 % (ref 11.5–15.5)
WBC: 6.9 10*3/uL (ref 4.0–10.5)
nRBC: 0 % (ref 0.0–0.2)

## 2019-09-04 LAB — SURGICAL PCR SCREEN
MRSA, PCR: NEGATIVE
Staphylococcus aureus: NEGATIVE

## 2019-09-04 LAB — SARS CORONAVIRUS 2 (TAT 6-24 HRS): SARS Coronavirus 2: NEGATIVE
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
## 2019-09-04 NOTE — Anesthesia Preprocedure Evaluation (Addendum)
Anesthesia Evaluation  Patient identified by MRN, date of birth, ID band Patient awake    Reviewed: Allergy & Precautions, NPO status , Patient's Chart, lab work & pertinent test results  Airway Mallampati: II  TM Distance: >3 FB Neck ROM: Full    Dental no notable dental hx.    Pulmonary sleep apnea , former smoker,    Pulmonary exam normal breath sounds clear to auscultation       Cardiovascular negative cardio ROS Normal cardiovascular exam Rhythm:Regular Rate:Normal     Neuro/Psych  Headaches, Dementia negative psych ROS   GI/Hepatic GERD  ,(+) Hepatitis -, C  Endo/Other  negative endocrine ROS  Renal/GU negative Renal ROS  negative genitourinary   Musculoskeletal  (+) Arthritis , Osteoarthritis,    Abdominal   Peds negative pediatric ROS (+)  Hematology negative hematology ROS (+)   Anesthesia Other Findings   Reproductive/Obstetrics negative OB ROS                            Anesthesia Physical Anesthesia Plan  ASA: II  Anesthesia Plan: Spinal   Post-op Pain Management:  Regional for Post-op pain   Induction: Intravenous  PONV Risk Score and Plan: 1 and Ondansetron and Treatment may vary due to age or medical condition  Airway Management Planned: Simple Face Mask  Additional Equipment:   Intra-op Plan:   Post-operative Plan:   Informed Consent: I have reviewed the patients History and Physical, chart, labs and discussed the procedure including the risks, benefits and alternatives for the proposed anesthesia with the patient or authorized representative who has indicated his/her understanding and acceptance.     Dental advisory given  Plan Discussed with: CRNA  Anesthesia Plan Comments: (See PAT note 09/04/2019, Konrad Felix, PA-C)       Anesthesia Quick Evaluation

## 2019-09-07 MED ORDER — BUPIVACAINE LIPOSOME 1.3 % IJ SUSP
20.0000 mL | Freq: Once | INTRAMUSCULAR | Status: DC
  Filled 2019-09-07 (×2): qty 20

## 2019-09-08 ENCOUNTER — Encounter (HOSPITAL_COMMUNITY): Payer: Self-pay | Admitting: Orthopaedic Surgery

## 2019-09-08 ENCOUNTER — Observation Stay (HOSPITAL_COMMUNITY)
Admission: RE | Admit: 2019-09-08 | Discharge: 2019-09-09 | Disposition: A | Discharge status: Home / Self Care | Payer: Medicare Other | Attending: Orthopaedic Surgery | Admitting: Orthopaedic Surgery

## 2019-09-08 ENCOUNTER — Observation Stay (HOSPITAL_COMMUNITY): Payer: Medicare Other

## 2019-09-08 ENCOUNTER — Ambulatory Visit (HOSPITAL_COMMUNITY): Payer: Medicare Other | Admitting: Certified Registered"

## 2019-09-08 ENCOUNTER — Encounter (HOSPITAL_COMMUNITY)
Admission: RE | Disposition: A | Discharge status: Home / Self Care | Payer: Self-pay | Source: Home / Self Care | Attending: Orthopaedic Surgery

## 2019-09-08 ENCOUNTER — Other Ambulatory Visit: Payer: Self-pay

## 2019-09-08 ENCOUNTER — Ambulatory Visit (HOSPITAL_COMMUNITY): Payer: Medicare Other | Admitting: Physician Assistant

## 2019-09-08 DIAGNOSIS — Z8673 Personal history of transient ischemic attack (TIA), and cerebral infarction without residual deficits: Secondary | ICD-10-CM | POA: Diagnosis not present

## 2019-09-08 DIAGNOSIS — M1731 Unilateral post-traumatic osteoarthritis, right knee: Principal | ICD-10-CM | POA: Insufficient documentation

## 2019-09-08 DIAGNOSIS — Z79899 Other long term (current) drug therapy: Secondary | ICD-10-CM | POA: Insufficient documentation

## 2019-09-08 DIAGNOSIS — Z09 Encounter for follow-up examination after completed treatment for conditions other than malignant neoplasm: Secondary | ICD-10-CM

## 2019-09-08 DIAGNOSIS — M1711 Unilateral primary osteoarthritis, right knee: Secondary | ICD-10-CM

## 2019-09-08 DIAGNOSIS — F0281 Dementia in other diseases classified elsewhere with behavioral disturbance: Secondary | ICD-10-CM | POA: Diagnosis not present

## 2019-09-08 DIAGNOSIS — Z85828 Personal history of other malignant neoplasm of skin: Secondary | ICD-10-CM | POA: Diagnosis not present

## 2019-09-08 DIAGNOSIS — Z87891 Personal history of nicotine dependence: Secondary | ICD-10-CM | POA: Diagnosis not present

## 2019-09-08 DIAGNOSIS — G309 Alzheimer's disease, unspecified: Secondary | ICD-10-CM | POA: Diagnosis not present

## 2019-09-08 DIAGNOSIS — Z7982 Long term (current) use of aspirin: Secondary | ICD-10-CM | POA: Insufficient documentation

## 2019-09-08 HISTORY — PX: TOTAL KNEE ARTHROPLASTY: SHX125

## 2019-09-08 SURGERY — ARTHROPLASTY, KNEE, TOTAL
Anesthesia: Spinal | Site: Knee | Laterality: Right

## 2019-09-08 MED ORDER — HYDROMORPHONE HCL 1 MG/ML IJ SOLN: 0.2500 mg | INTRAMUSCULAR | Status: DC | PRN

## 2019-09-08 MED ORDER — BUPIVACAINE HCL (PF) 0.25 % IJ SOLN
INTRAMUSCULAR | Status: DC | PRN
  Administered 2019-09-08: 20 mL

## 2019-09-08 MED ORDER — METHOCARBAMOL 500 MG IVPB - SIMPLE MED
500.0000 mg | Freq: Four times a day (QID) | INTRAVENOUS | Status: DC | PRN
  Filled 2019-09-08: qty 50

## 2019-09-08 MED ORDER — CHLORHEXIDINE GLUCONATE 0.12 % MT SOLN
15.0000 mL | Freq: Once | OROMUCOSAL | Status: AC
  Administered 2019-09-08: 15 mL via OROMUCOSAL

## 2019-09-08 MED ORDER — PROPOFOL 500 MG/50ML IV EMUL
INTRAVENOUS | Status: DC | PRN
  Administered 2019-09-08 (×2): 20 mg via INTRAVENOUS
  Administered 2019-09-08: 30 mg via INTRAVENOUS

## 2019-09-08 MED ORDER — DEXAMETHASONE SODIUM PHOSPHATE 10 MG/ML IJ SOLN
INTRAMUSCULAR | Status: DC | PRN
  Administered 2019-09-08: 10 mg via INTRAVENOUS

## 2019-09-08 MED ORDER — PANTOPRAZOLE SODIUM 40 MG PO TBEC
40.0000 mg | DELAYED_RELEASE_TABLET | Freq: Every day | ORAL | Status: DC
  Administered 2019-09-08 – 2019-09-09 (×2): 40 mg via ORAL
  Filled 2019-09-08 (×2): qty 1

## 2019-09-08 MED ORDER — BUPIVACAINE LIPOSOME 1.3 % IJ SUSP: 20.0000 mL | Freq: Once | INTRAMUSCULAR | Status: DC

## 2019-09-08 MED ORDER — ONDANSETRON HCL 4 MG/2ML IJ SOLN
INTRAMUSCULAR | Status: AC
  Filled 2019-09-08: qty 2

## 2019-09-08 MED ORDER — CEFAZOLIN SODIUM-DEXTROSE 2-4 GM/100ML-% IV SOLN
2.0000 g | INTRAVENOUS | Status: AC
  Administered 2019-09-08: 2 g via INTRAVENOUS
  Filled 2019-09-08: qty 100

## 2019-09-08 MED ORDER — EPHEDRINE SULFATE-NACL 50-0.9 MG/10ML-% IV SOSY
PREFILLED_SYRINGE | INTRAVENOUS | Status: DC | PRN
  Administered 2019-09-08 (×5): 10 mg via INTRAVENOUS

## 2019-09-08 MED ORDER — METOCLOPRAMIDE HCL 5 MG/ML IJ SOLN: 5.0000 mg | Freq: Three times a day (TID) | INTRAMUSCULAR | Status: DC | PRN

## 2019-09-08 MED ORDER — BUPIVACAINE HCL 0.25 % IJ SOLN
INTRAMUSCULAR | Status: AC
  Filled 2019-09-08: qty 1

## 2019-09-08 MED ORDER — MIDAZOLAM HCL 2 MG/2ML IJ SOLN: 1.0000 mg | INTRAMUSCULAR | Status: AC

## 2019-09-08 MED ORDER — METOCLOPRAMIDE HCL 5 MG PO TABS: 5.0000 mg | ORAL_TABLET | Freq: Three times a day (TID) | ORAL | Status: DC | PRN

## 2019-09-08 MED ORDER — PROPOFOL 500 MG/50ML IV EMUL
INTRAVENOUS | Status: DC | PRN
  Administered 2019-09-08: 30 ug/kg/min via INTRAVENOUS

## 2019-09-08 MED ORDER — MENTHOL 3 MG MT LOZG: 1.0000 | LOZENGE | OROMUCOSAL | Status: DC | PRN

## 2019-09-08 MED ORDER — SODIUM CHLORIDE 0.9 % IR SOLN
Status: DC | PRN
  Administered 2019-09-08: 3000 mL

## 2019-09-08 MED ORDER — METHOCARBAMOL 500 MG PO TABS: 500.0000 mg | ORAL_TABLET | Freq: Four times a day (QID) | ORAL | 0 refills | Status: DC | PRN

## 2019-09-08 MED ORDER — SIMVASTATIN 20 MG PO TABS: 10.0000 mg | ORAL_TABLET | Freq: Every day | ORAL | Status: DC

## 2019-09-08 MED ORDER — OXYCODONE HCL 5 MG PO TABS: 5.0000 mg | ORAL_TABLET | Freq: Once | ORAL | Status: DC | PRN

## 2019-09-08 MED ORDER — ASPIRIN EC 325 MG PO TBEC
325.0000 mg | DELAYED_RELEASE_TABLET | Freq: Every day | ORAL | Status: DC
  Administered 2019-09-09: 325 mg via ORAL
  Filled 2019-09-08: qty 1

## 2019-09-08 MED ORDER — METHOCARBAMOL 500 MG PO TABS
500.0000 mg | ORAL_TABLET | Freq: Four times a day (QID) | ORAL | Status: DC | PRN
  Administered 2019-09-08: 500 mg via ORAL
  Filled 2019-09-08: qty 1

## 2019-09-08 MED ORDER — FENTANYL CITRATE (PF) 100 MCG/2ML IJ SOLN
INTRAMUSCULAR | Status: AC
  Administered 2019-09-08: 50 ug via INTRAVENOUS
  Filled 2019-09-08: qty 2

## 2019-09-08 MED ORDER — PROMETHAZINE HCL 25 MG/ML IJ SOLN: 6.2500 mg | INTRAMUSCULAR | Status: DC | PRN

## 2019-09-08 MED ORDER — FENTANYL CITRATE (PF) 100 MCG/2ML IJ SOLN
INTRAMUSCULAR | Status: AC
  Filled 2019-09-08: qty 2

## 2019-09-08 MED ORDER — 0.9 % SODIUM CHLORIDE (POUR BTL) OPTIME
TOPICAL | Status: DC | PRN
  Administered 2019-09-08: 1000 mL

## 2019-09-08 MED ORDER — BUPIVACAINE LIPOSOME 1.3 % IJ SUSP
INTRAMUSCULAR | Status: DC | PRN
  Administered 2019-09-08: 20 mL

## 2019-09-08 MED ORDER — DONEPEZIL HCL 10 MG PO TABS
10.0000 mg | ORAL_TABLET | Freq: Every day | ORAL | Status: DC
  Administered 2019-09-08: 10 mg via ORAL
  Filled 2019-09-08: qty 1

## 2019-09-08 MED ORDER — ACETAMINOPHEN 325 MG PO TABS
325.0000 mg | ORAL_TABLET | Freq: Four times a day (QID) | ORAL | Status: DC | PRN
  Administered 2019-09-08 – 2019-09-09 (×3): 650 mg via ORAL
  Filled 2019-09-08 (×3): qty 2

## 2019-09-08 MED ORDER — PROPOFOL 10 MG/ML IV BOLUS
INTRAVENOUS | Status: AC
  Filled 2019-09-08: qty 20

## 2019-09-08 MED ORDER — EPHEDRINE 5 MG/ML INJ
INTRAVENOUS | Status: AC
  Filled 2019-09-08: qty 10

## 2019-09-08 MED ORDER — ROPIVACAINE HCL 5 MG/ML IJ SOLN
INTRAMUSCULAR | Status: DC | PRN
  Administered 2019-09-08: 20 mL via PERINEURAL

## 2019-09-08 MED ORDER — ORAL CARE MOUTH RINSE: 15.0000 mL | Freq: Once | OROMUCOSAL | Status: AC

## 2019-09-08 MED ORDER — PROPOFOL 1000 MG/100ML IV EMUL
INTRAVENOUS | Status: AC
  Filled 2019-09-08: qty 100

## 2019-09-08 MED ORDER — PHENYLEPHRINE 40 MCG/ML (10ML) SYRINGE FOR IV PUSH (FOR BLOOD PRESSURE SUPPORT)
PREFILLED_SYRINGE | INTRAVENOUS | Status: AC
  Filled 2019-09-08: qty 10

## 2019-09-08 MED ORDER — SODIUM CHLORIDE 0.9 % IV SOLN: INTRAVENOUS | Status: DC

## 2019-09-08 MED ORDER — LIDOCAINE 2% (20 MG/ML) 5 ML SYRINGE
INTRAMUSCULAR | Status: AC
  Filled 2019-09-08: qty 5

## 2019-09-08 MED ORDER — FENTANYL CITRATE (PF) 100 MCG/2ML IJ SOLN
INTRAMUSCULAR | Status: DC | PRN
  Administered 2019-09-08 (×2): 25 ug via INTRAVENOUS

## 2019-09-08 MED ORDER — FENTANYL CITRATE (PF) 100 MCG/2ML IJ SOLN: 50.0000 ug | INTRAMUSCULAR | Status: AC

## 2019-09-08 MED ORDER — PHENOL 1.4 % MT LIQD: 1.0000 | OROMUCOSAL | Status: DC | PRN

## 2019-09-08 MED ORDER — DEXAMETHASONE SODIUM PHOSPHATE 10 MG/ML IJ SOLN
INTRAMUSCULAR | Status: AC
  Filled 2019-09-08: qty 1

## 2019-09-08 MED ORDER — STERILE WATER FOR IRRIGATION IR SOLN
Status: DC | PRN
  Administered 2019-09-08: 2000 mL

## 2019-09-08 MED ORDER — BUPIVACAINE IN DEXTROSE 0.75-8.25 % IT SOLN
INTRATHECAL | Status: DC | PRN
  Administered 2019-09-08: 1.6 mL via INTRATHECAL

## 2019-09-08 MED ORDER — OXYCODONE HCL 5 MG/5ML PO SOLN: 5.0000 mg | Freq: Once | ORAL | Status: DC | PRN

## 2019-09-08 MED ORDER — ONDANSETRON HCL 4 MG PO TABS: 4.0000 mg | ORAL_TABLET | Freq: Four times a day (QID) | ORAL | Status: DC | PRN

## 2019-09-08 MED ORDER — ASPIRIN EC 325 MG PO TBEC: 325.0000 mg | DELAYED_RELEASE_TABLET | Freq: Every day | ORAL | 0 refills | Status: DC

## 2019-09-08 MED ORDER — MIDAZOLAM HCL 2 MG/2ML IJ SOLN
INTRAMUSCULAR | Status: AC
  Filled 2019-09-08: qty 2

## 2019-09-08 MED ORDER — OXYCODONE-ACETAMINOPHEN 5-325 MG PO TABS: 1.0000 | ORAL_TABLET | ORAL | 0 refills | Status: DC | PRN

## 2019-09-08 MED ORDER — LACTATED RINGERS IV SOLN: INTRAVENOUS | Status: DC

## 2019-09-08 MED ORDER — DOCUSATE SODIUM 100 MG PO CAPS
100.0000 mg | ORAL_CAPSULE | Freq: Two times a day (BID) | ORAL | Status: DC
  Administered 2019-09-08 – 2019-09-09 (×2): 100 mg via ORAL
  Filled 2019-09-08 (×2): qty 1

## 2019-09-08 MED ORDER — HYDROMORPHONE HCL 1 MG/ML IJ SOLN: 0.5000 mg | INTRAMUSCULAR | Status: DC | PRN

## 2019-09-08 MED ORDER — OXYCODONE HCL 5 MG PO TABS
5.0000 mg | ORAL_TABLET | ORAL | Status: DC | PRN
  Administered 2019-09-08 – 2019-09-09 (×4): 5 mg via ORAL
  Filled 2019-09-08 (×4): qty 1

## 2019-09-08 MED ORDER — ONDANSETRON HCL 4 MG/2ML IJ SOLN: 4.0000 mg | Freq: Four times a day (QID) | INTRAMUSCULAR | Status: DC | PRN

## 2019-09-08 MED ORDER — ONDANSETRON HCL 4 MG/2ML IJ SOLN
INTRAMUSCULAR | Status: DC | PRN
  Administered 2019-09-08: 4 mg via INTRAVENOUS

## 2019-09-08 MED ORDER — MIDAZOLAM HCL 5 MG/5ML IJ SOLN
INTRAMUSCULAR | Status: DC | PRN
  Administered 2019-09-08: 1 mg via INTRAVENOUS

## 2019-09-08 MED ORDER — MIDAZOLAM HCL 2 MG/2ML IJ SOLN
INTRAMUSCULAR | Status: AC
  Administered 2019-09-08: 2 mg via INTRAVENOUS
  Filled 2019-09-08: qty 2

## 2019-09-08 SURGICAL SUPPLY — 76 items
APL PRP STRL LF DISP 70% ISPRP (MISCELLANEOUS) ×1
ATTUNE MED DOME PAT 41 KNEE (Knees) ×1 IMPLANT
ATTUNE PS FEM RT SZ 8 CEM KNEE (Femur) ×1 IMPLANT
ATTUNE PSRP INSR SZ8 5 KNEE (Insert) ×1 IMPLANT
BAG SPEC THK2 15X12 ZIP CLS (MISCELLANEOUS) ×1
BAG ZIPLOCK 12X15 (MISCELLANEOUS) ×2 IMPLANT
BANDAGE ESMARK 6X9 LF (GAUZE/BANDAGES/DRESSINGS) ×1 IMPLANT
BASE TIBIAL ROT PLAT SZ 8 KNEE (Knees) IMPLANT
BLADE SAGITTAL 25.0X1.19X90 (BLADE) ×1 IMPLANT
BLADE SAW SGTL 13.0X1.19X90.0M (BLADE) ×2 IMPLANT
BLADE SURG SZ10 CARB STEEL (BLADE) ×4 IMPLANT
BNDG CMPR 9X6 STRL LF SNTH (GAUZE/BANDAGES/DRESSINGS) ×1
BNDG ELASTIC 4X5.8 VLCR STR LF (GAUZE/BANDAGES/DRESSINGS) ×2 IMPLANT
BNDG ELASTIC 6X5.8 VLCR STR LF (GAUZE/BANDAGES/DRESSINGS) ×2 IMPLANT
BNDG ESMARK 6X9 LF (GAUZE/BANDAGES/DRESSINGS) ×2
BSPLAT TIB 8 CMNT ROT PLAT STR (Knees) ×1 IMPLANT
CEMENT HV SMART SET (Cement) ×2 IMPLANT
CHLORAPREP W/TINT 26 (MISCELLANEOUS) ×2 IMPLANT
COVER SURGICAL LIGHT HANDLE (MISCELLANEOUS) ×2 IMPLANT
COVER WAND RF STERILE (DRAPES) ×1 IMPLANT
CUFF TOURN SGL QUICK 34 (TOURNIQUET CUFF) ×2
CUFF TRNQT CYL 34X4.125X (TOURNIQUET CUFF) ×1 IMPLANT
DECANTER SPIKE VIAL GLASS SM (MISCELLANEOUS) IMPLANT
DRAPE ORTHO SPLIT 77X108 STRL (DRAPES) ×4
DRAPE POUCH INSTRU U-SHP 10X18 (DRAPES) ×2 IMPLANT
DRAPE SURG ORHT 6 SPLT 77X108 (DRAPES) ×2 IMPLANT
DRAPE U-SHAPE 47X51 STRL (DRAPES) ×2 IMPLANT
DRSG MEPILEX BORDER 4X12 (GAUZE/BANDAGES/DRESSINGS) ×1 IMPLANT
DRSG PAD ABDOMINAL 8X10 ST (GAUZE/BANDAGES/DRESSINGS) ×2 IMPLANT
ELECT REM PT RETURN 15FT ADLT (MISCELLANEOUS) ×2 IMPLANT
EVACUATOR 1/8 PVC DRAIN (DRAIN) ×2 IMPLANT
FACESHIELD WRAPAROUND (MASK) ×10 IMPLANT
FACESHIELD WRAPAROUND OR TEAM (MASK) ×5 IMPLANT
GAUZE XEROFORM 5X9 LF (GAUZE/BANDAGES/DRESSINGS) ×2 IMPLANT
GLOVE ORTHO TXT STRL SZ7.5 (GLOVE) ×2 IMPLANT
GOWN STRL REUS W/TWL LRG LVL3 (GOWN DISPOSABLE) ×2 IMPLANT
HANDPIECE INTERPULSE COAX TIP (DISPOSABLE) ×2
HOLDER FOLEY CATH W/STRAP (MISCELLANEOUS) ×1 IMPLANT
IMMOBILIZER KNEE 20 (SOFTGOODS) ×2
IMMOBILIZER KNEE 20 THIGH 36 (SOFTGOODS) IMPLANT
IMMOBILIZER KNEE 22 UNIV (SOFTGOODS) ×1 IMPLANT
KIT BASIN OR (CUSTOM PROCEDURE TRAY) ×2 IMPLANT
KIT TURNOVER KIT A (KITS) IMPLANT
NDL HYPO 21X1.5 SAFETY (NEEDLE) ×1 IMPLANT
NDL SAFETY ECLIPSE 18X1.5 (NEEDLE) IMPLANT
NEEDLE HYPO 18GX1.5 SHARP (NEEDLE)
NEEDLE HYPO 21X1.5 SAFETY (NEEDLE) ×2 IMPLANT
NS IRRIG 1000ML POUR BTL (IV SOLUTION) ×2 IMPLANT
PAD CAST 4YDX4 CTTN HI CHSV (CAST SUPPLIES) ×2 IMPLANT
PADDING CAST COTTON 4X4 STRL (CAST SUPPLIES) ×4
PADDING CAST COTTON 6X4 STRL (CAST SUPPLIES) ×3 IMPLANT
PENCIL SMOKE EVACUATOR (MISCELLANEOUS) ×1 IMPLANT
PIN DRILL FIX HALF THREAD (BIT) ×1 IMPLANT
PIN STEINMAN FIXATION KNEE (PIN) ×1 IMPLANT
PROTECTOR NERVE ULNAR (MISCELLANEOUS) ×2 IMPLANT
SET HNDPC FAN SPRY TIP SCT (DISPOSABLE) ×1 IMPLANT
SPONGE LAP 18X18 RF (DISPOSABLE) ×2 IMPLANT
STAPLER VISISTAT 35W (STAPLE) ×2 IMPLANT
SUCTION FRAZIER HANDLE 12FR (TUBING) ×2
SUCTION TUBE FRAZIER 12FR DISP (TUBING) ×1 IMPLANT
SUT ETHIBOND NAB CT1 #1 30IN (SUTURE) ×4 IMPLANT
SUT VIC AB 0 CT1 27 (SUTURE) ×6
SUT VIC AB 0 CT1 27XBRD ANTBC (SUTURE) ×3 IMPLANT
SUT VIC AB 1 CT1 27 (SUTURE) ×6
SUT VIC AB 1 CT1 27XBRD ANTBC (SUTURE) ×3 IMPLANT
SUT VIC AB 1 CTX 36 (SUTURE) ×4
SUT VIC AB 1 CTX36XBRD ANBCTR (SUTURE) IMPLANT
SUT VIC AB 2-0 CT1 27 (SUTURE) ×6
SUT VIC AB 2-0 CT1 TAPERPNT 27 (SUTURE) ×3 IMPLANT
SYR 3ML LL SCALE MARK (SYRINGE) IMPLANT
SYR CONTROL 10ML LL (SYRINGE) ×2 IMPLANT
TIBIAL BASE ROT PLAT SZ 8 KNEE (Knees) ×2 IMPLANT
TOWEL OR 17X26 10 PK STRL BLUE (TOWEL DISPOSABLE) ×4 IMPLANT
TRAY FOLEY MTR SLVR 16FR STAT (SET/KITS/TRAYS/PACK) ×2 IMPLANT
WATER STERILE IRR 1000ML POUR (IV SOLUTION) ×2 IMPLANT
WRAP KNEE MAXI GEL POST OP (GAUZE/BANDAGES/DRESSINGS) ×1 IMPLANT

## 2019-09-08 NOTE — Interval H&P Note (Signed)
History and Physical Interval Note:  09/08/2019 12:43 PM  [name]  has presented today for surgery, with the diagnosis of right knee osteoarthritis.  The various methods of treatment have been discussed with the patient and family. After consideration of risks, benefits and other options for treatment, the patient has consented to  Procedure(s): RIGHT TOTAL KNEE ARTHROPLASTY (Right) as a surgical intervention.  The patient's history has been reviewed, patient examined, no change in status, stable for surgery.  I have reviewed the patient's chart and labs.  Questions were answered to the patient's satisfaction.     Brendan Holland

## 2019-09-08 NOTE — Anesthesia Postprocedure Evaluation (Signed)
Anesthesia Post Note  Patient: Brendan FEDEWA  Procedure(s) Performed: RIGHT TOTAL KNEE ARTHROPLASTY (Right Knee)     Patient location during evaluation: PACU Anesthesia Type: Spinal Level of consciousness: awake and alert Pain management: pain level controlled Vital Signs Assessment: post-procedure vital signs reviewed and stable Respiratory status: spontaneous breathing, nonlabored ventilation and respiratory function stable Cardiovascular status: blood pressure returned to baseline and stable Postop Assessment: no apparent nausea or vomiting Anesthetic complications: no   No complications documented.  Last Vitals:  Vitals:   09/08/19 1600 09/08/19 1619  BP: 120/64 125/75  Pulse: 61 70  Resp: 15 16  Temp: 36.7 C 36.7 C  SpO2: 100% 98%    Last Pain:  Vitals:   09/08/19 1619  TempSrc: Oral  PainSc: 0-No pain                 Lynda Rainwater

## 2019-09-08 NOTE — Discharge Instructions (Signed)
INSTRUCTIONS AFTER JOINT REPLACEMENT   o Remove items at home which could result in a fall. This includes throw rugs or furniture in walking pathways o ICE to the affected joint every three hours while awake for 30 minutes at a time, for at least the first 3-5 days, and then as needed for pain and swelling.  Continue to use ice for pain and swelling. You may notice swelling that will progress down to the foot and ankle.  This is normal after surgery.  Elevate your leg when you are not up walking on it.   o Continue to use the breathing machine you got in the hospital (incentive spirometer) which will help keep your temperature down.  It is common for your temperature to cycle up and down following surgery, especially at night when you are not up moving around and exerting yourself.  The breathing machine keeps your lungs expanded and your temperature down.   DIET:  As you were doing prior to hospitalization, we recommend a well-balanced diet.  DRESSING / WOUND CARE / SHOWERING  Ok to remove dressing and shower 3 days postop.  Daily dressing changes with 4x4 gauze and tape.  No tub soaking.  Do not apply any creams of ointments to incision.     ACTIVITY  o Increase activity slowly as tolerated, but follow the weight bearing instructions below.   o No driving for 6 weeks or until further direction given by your physician.  You cannot drive while taking narcotics.  o No lifting or carrying greater than 10 lbs. until further directed by your surgeon. o Avoid periods of inactivity such as sitting longer than an hour when not asleep. This helps prevent blood clots.  o You may return to work once you are authorized by your doctor.     WEIGHT BEARING   Weight bearing as tolerated with assist device (walker, cane, etc) as directed, use it as long as suggested by your surgeon or therapist, typically at least 4-6 weeks.   EXERCISES  Results after joint replacement surgery are often greatly  improved when you follow the exercise, range of motion and muscle strengthening exercises prescribed by your doctor. Safety measures are also important to protect the joint from further injury. Any time any of these exercises cause you to have increased pain or swelling, decrease what you are doing until you are comfortable again and then slowly increase them. If you have problems or questions, call your caregiver or physical therapist for advice.   Rehabilitation is important following a joint replacement. After just a few days of immobilization, the muscles of the leg can become weakened and shrink (atrophy).  These exercises are designed to build up the tone and strength of the thigh and leg muscles and to improve motion. Often times heat used for twenty to thirty minutes before working out will loosen up your tissues and help with improving the range of motion but do not use heat for the first two weeks following surgery (sometimes heat can increase post-operative swelling).   These exercises can be done on a training (exercise) mat, on the floor, on a table or on a bed. Use whatever works the best and is most comfortable for you.    Use music or television while you are exercising so that the exercises are a pleasant break in your day. This will make your life better with the exercises acting as a break in your routine that you can look forward to.  Perform all exercises about fifteen times, three times per day or as directed.  You should exercise both the operative leg and the other leg as well.  Exercises include:   . Quad Sets - Tighten up the muscle on the front of the thigh (Quad) and hold for 5-10 seconds.   . Straight Leg Raises - With your knee straight (if you were given a brace, keep it on), lift the leg to 60 degrees, hold for 3 seconds, and slowly lower the leg.  Perform this exercise against resistance later as your leg gets stronger.  . Leg Slides: Lying on your back, slowly slide your  foot toward your buttocks, bending your knee up off the floor (only go as far as is comfortable). Then slowly slide your foot back down until your leg is flat on the floor again.  Glenard Haring Wings: Lying on your back spread your legs to the side as far apart as you can without causing discomfort.  . Hamstring Strength:  Lying on your back, push your heel against the floor with your leg straight by tightening up the muscles of your buttocks.  Repeat, but this time bend your knee to a comfortable angle, and push your heel against the floor.  You may put a pillow under the heel to make it more comfortable if necessary.   A rehabilitation program following joint replacement surgery can speed recovery and prevent re-injury in the future due to weakened muscles. Contact your doctor or a physical therapist for more information on knee rehabilitation.    CONSTIPATION  Constipation is defined medically as fewer than three stools per week and severe constipation as less than one stool per week.  Even if you have a regular bowel pattern at home, your normal regimen is likely to be disrupted due to multiple reasons following surgery.  Combination of anesthesia, postoperative narcotics, change in appetite and fluid intake all can affect your bowels.   YOU MUST use at least one of the following options; they are listed in order of increasing strength to get the job done.  They are all available over the counter, and you may need to use some, POSSIBLY even all of these options:    Drink plenty of fluids (prune juice may be helpful) and high fiber foods Colace 100 mg by mouth twice a day  Senokot for constipation as directed and as needed Dulcolax (bisacodyl), take with full glass of water  Miralax (polyethylene glycol) once or twice a day as needed.  If you have tried all these things and are unable to have a bowel movement in the first 3-4 days after surgery call either your surgeon or your primary doctor.    If  you experience loose stools or diarrhea, hold the medications until you stool forms back up.  If your symptoms do not get better within 1 week or if they get worse, check with your doctor.  If you experience "the worst abdominal pain ever" or develop nausea or vomiting, please contact the office immediately for further recommendations for treatment.   ITCHING:  If you experience itching with your medications, try taking only a single pain pill, or even half a pain pill at a time.  You can also use Benadryl over the counter for itching or also to help with sleep.   TED HOSE STOCKINGS:  Use stockings on both legs until for at least 2 weeks or as directed by physician office. They may be removed at night for  sleeping.  MEDICATIONS:  See your medication summary on the "After Visit Summary" that nursing will review with you.  You may have some home medications which will be placed on hold until you complete the course of blood thinner medication.  It is important for you to complete the blood thinner medication as prescribed.  PRECAUTIONS:  If you experience chest pain or shortness of breath - call 911 immediately for transfer to the hospital emergency department.   If you develop a fever greater that 101 F, purulent drainage from wound, increased redness or drainage from wound, foul odor from the wound/dressing, or calf pain - CONTACT YOUR SURGEON.                                                   FOLLOW-UP APPOINTMENTS:  If you do not already have a post-op appointment, please call the office for an appointment to be seen by your surgeon.  Guidelines for how soon to be seen are listed in your "After Visit Summary", but are typically between 1-4 weeks after surgery.  OTHER INSTRUCTIONS:   Knee Replacement:  Do not place pillow under knee, focus on keeping the knee straight while resting. CPM instructions: 0-90 degrees, 2 hours in the morning, 2 hours in the afternoon, and 2 hours in the evening. Place  foam block, curve side up under heel at all times except when in CPM or when walking.  DO NOT modify, tear, cut, or change the foam block in any way.   DENTAL ANTIBIOTICS:  In most cases prophylactic antibiotics for Dental procdeures after total joint surgery are not necessary.  Exceptions are as follows:  1. History of prior total joint infection  2. Severely immunocompromised (Organ Transplant, cancer chemotherapy, Rheumatoid biologic meds such as [place])  3. Poorly controlled diabetes (A1C &gt; 8.0, blood glucose over 200)  If you have one of these conditions, contact your surgeon for an antibiotic prescription, prior to your dental procedure.   MAKE SURE YOU:  . Understand these instructions.  . Get help right away if you are not doing well or get worse.    Thank you for letting us be a part of your medical care team.  It is a privilege we respect greatly.  We hope these instructions will help you stay on track for a fast and full recovery!    Dental Antibiotics:  In most cases prophylactic antibiotics for Dental procdeures after total joint surgery are not necessary.  Exceptions are as follows:  1. History of prior total joint infection  2. Severely immunocompromised (Organ Transplant, cancer chemotherapy, Rheumatoid biologic meds such as [place])  3. Poorly controlled diabetes (A1C &gt; 8.0, blood glucose over 200)  If you have one of these conditions, contact your surgeon for an antibiotic prescription, prior to your dental procedure.

## 2019-09-08 NOTE — Anesthesia Procedure Notes (Signed)
Spinal  Patient location during procedure: OR Start time: 09/08/2019 1:22 PM End time: 09/08/2019 1:30 PM Staffing Performed: resident/CRNA  Anesthesiologist: Lynda Rainwater, MD Resident/CRNA: Silas Sacramento, CRNA Preanesthetic Checklist Completed: patient identified, IV checked, site marked, risks and benefits discussed, surgical consent, monitors and equipment checked, pre-op evaluation and timeout performed Spinal Block Patient position: sitting Prep: DuraPrep Patient monitoring: heart rate, cardiac monitor, continuous pulse ox and blood pressure Approach: midline Location: L3-4 Injection technique: single-shot Needle Needle type: Sprotte and Pencan  Needle gauge: 24 G Needle length: 9 cm Assessment Sensory level: T4 Additional Notes IV functioning, monitors applied to pt. Expiration date of kit checked and confirmed to be in date. Sterile prep and drape, hand hygiene and sterile gloved used. Pt was positioned and spine was prepped in sterile fashion. Skin was anesthetized with lidocaine. Free flow of clear CSF obtained prior to injecting local anesthetic into CSF x 1 attempt. Spinal needle aspirated freely following injection. Needle was carefully withdrawn, and pt tolerated procedure well. Loss of motor and sensory on exam post injection.

## 2019-09-08 NOTE — Progress Notes (Signed)
AssistedDr. Miller with right, ultrasound guided, adductor canal block. Side rails up, monitors on throughout procedure. See vital signs in flow sheet. Tolerated Procedure well.  

## 2019-09-08 NOTE — Op Note (Signed)
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  Procedure: Right cemented total knee arthroplasty.  Surgeon: Rodell Perna, MD  Assistant: Benjiman Core, PA-C medically necessary and present for the entire procedure  Anesthesia preoperative block plus spinal anesthesia +20 cc Marcaine 20 cc Exparel at end of case.  EBL: 100 cc  Tourniquet time: 300 x 54 minutes.  Implants:Depuy Attune size 8 5 mm rotating platform.  41 mm dome patella.  Size 8 femur size 8 tibial baseplate.  Procedure: After preoperative block proximal thigh tourniquet Foley catheter placement standard prepping with DuraPrep from the tourniquet to the toes preoperative Ancef prophylaxis usual total knee sheets drapes sterile impervious stockinette Coban sterile skin marker Betadine Steri-Drape was applied.  Timeout procedure was completed.  Leg was wrapped in Esmarch tourniquet inflated.  Midline incision was made medial parapatellar incision patella was everted 10 mm was resected with oscillating saw and patient had a large patella.  There was grade 4 changes in the lateral compartment with sclerotic fibrous tissue present in the old area of the lateral tibial plateau fracture.  Meniscal remnants were resected ACL PCL was resected intramedullary hole drilled in the femur.  9 mm taken off the distal femur 10 mm off the tibia.  5 mm spacer block fit after additional millimeters taken off each side.  Knee reach full extension there was some remnant of PCL left which was resected.  Chamfer cuts made on the femur box cuts made on the femur keel preparation of the tibial pulsatile lavage.  Tibia was cemented followed by femur placement of the permanent 5 mm poly and then cementing of the 41 mm 3 peg all polypatella.  Trials have been used there was full extension collateral ligaments were balanced flexion extension.  Pulsatile lavage of the bone and while cement was setting up Exparel Marcaine was injected 20+20 = 40 cc total.   Tourniquet was deflated when the cement was hardened 15 minutes hemostasis obtained standard capsular closure interrupted fashion 2-0 Vicryl subtendinous tissue skin staple closure postop dressing knee immobilizer patient tolerated the procedure well transfer the care room in stable condition.

## 2019-09-08 NOTE — Transfer of Care (Signed)
Immediate Anesthesia Transfer of Care Note  Patient: Brendan Holland  Procedure(s) Performed: RIGHT TOTAL KNEE ARTHROPLASTY (Right Knee)  Patient Location: PACU  Anesthesia Type:Spinal and MAC combined with regional for post-op pain  Level of Consciousness: awake, oriented, patient cooperative and responds to stimulation  Airway & Oxygen Therapy: Patient Spontanous Breathing and Patient connected to face mask oxygen  Post-op Assessment: Report given to RN and Post -op Vital signs reviewed and stable  Post vital signs: Reviewed and stable  Last Vitals:  Vitals Value Taken Time  BP 124/67 09/08/19 1523  Temp    Pulse 58 09/08/19 1524  Resp 11 09/08/19 1524  SpO2 100 % 09/08/19 1524  Vitals shown include unvalidated device data.  Last Pain:  Vitals:   09/08/19 1230  TempSrc:   PainSc: 0-No pain         Complications: No complications documented.

## 2019-09-08 NOTE — Progress Notes (Signed)
Orthopedic Tech Progress Note Patient Details:  Brendan Holland 10/31/1950 [id number]  CPM Right Knee CPM Right Knee: On Right Knee Flexion (Degrees): 90 Right Knee Extension (Degrees): 0  Post Interventions Patient Tolerated: Well Instructions Provided: Care of device  [name] 09/08/2019, 4:02 PM

## 2019-09-08 NOTE — Progress Notes (Signed)
Cancelled duplicate labs, cxr, and ekg.

## 2019-09-08 NOTE — Anesthesia Procedure Notes (Signed)
Anesthesia Regional Block: Adductor canal block   Pre-Anesthetic Checklist: ,, timeout performed, Correct Patient, Correct Site, Correct Laterality, Correct Procedure, Correct Position, site marked, Risks and benefits discussed,  Surgical consent,  Pre-op evaluation,  At surgeon's request and post-op pain management  Laterality: Right  Prep: chloraprep       Needles:  Injection technique: Single-shot  Needle Type: Stimiplex     Needle Length: 9cm  Needle Gauge: 21     Additional Needles:   Procedures:,,,, ultrasound used (permanent image in chart),,,,  Narrative:  Start time: 09/08/2019 12:12 PM End time: 09/08/2019 12:17 PM Injection made incrementally with aspirations every 5 mL.  Performed by: Personally  Anesthesiologist: Lynda Rainwater, MD

## 2019-09-09 ENCOUNTER — Encounter (HOSPITAL_COMMUNITY): Payer: Self-pay | Admitting: Orthopaedic Surgery

## 2019-09-09 DIAGNOSIS — M1731 Unilateral post-traumatic osteoarthritis, right knee: Secondary | ICD-10-CM | POA: Diagnosis not present

## 2019-09-09 LAB — CBC
HCT: 30.8 % — ABNORMAL LOW (ref 39.0–52.0)
Hemoglobin: 11 g/dL — ABNORMAL LOW (ref 13.0–17.0)
MCH: 33.3 pg (ref 26.0–34.0)
MCHC: 35.7 g/dL (ref 30.0–36.0)
MCV: 93.3 fL (ref 80.0–100.0)
Platelets: 133 10*3/uL — ABNORMAL LOW (ref 150–400)
RBC: 3.3 MIL/uL — ABNORMAL LOW (ref 4.22–5.81)
RDW: 11.8 % (ref 11.5–15.5)
WBC: 10.8 10*3/uL — ABNORMAL HIGH (ref 4.0–10.5)
nRBC: 0 % (ref 0.0–0.2)

## 2019-09-09 LAB — BASIC METABOLIC PANEL
Anion gap: 9 (ref 5–15)
BUN: 13 mg/dL (ref 8–23)
CO2: 25 mmol/L (ref 22–32)
Calcium: 8.4 mg/dL — ABNORMAL LOW (ref 8.9–10.3)
Chloride: 101 mmol/L (ref 98–111)
Creatinine, Ser: 0.94 mg/dL (ref 0.61–1.24)
GFR calc Af Amer: >60 mL/min (ref >60)
GFR calc non Af Amer: >60 mL/min (ref >60)
Glucose, Bld: 169 mg/dL — ABNORMAL HIGH (ref 70–99)
Potassium: 4 mmol/L (ref 3.5–5.1)
Sodium: 135 mmol/L (ref 135–145)

## 2019-09-09 NOTE — Progress Notes (Signed)
OT Cancellation Note  Patient Details Name: KORON GODEAUX MRN: [id number] DOB: Jan 02, 1951   Cancelled Treatment:    Reason Eval/Treat Not Completed: OT screened, no needs identified, will sign off. Consulted with Physical Therapy, patient is mobilizing well, has HH and CPM needs set up with no acute OT needs indicated at this time. Will sign off, thank you.  Delbert Phenix OT OT pager: [place] 09/09/2019, 11:01 AM

## 2019-09-09 NOTE — TOC Transition Note (Signed)
Transition of Care [facility]) - CM/SW Discharge Note   Patient Details  Name: MONTI JILEK MRN: [id number] Date of Birth: 20-Feb-1950  [facility] CM/SW Contact:  Lennart Pall, LCSW Phone Number: 09/09/2019, 10:02 AM   Clinical Narrative:    Met briefly with pt to confirm his understanding of dc plans for HHPT.  He also confirms CPM has been delivered to house.  No further TOC needs.   Final next level of care: [place] Barriers to Discharge: No Barriers Identified   Patient Goals and CMS Choice Patient states their goals for this hospitalization and ongoing recovery are:: go home today      Discharge Placement                       Discharge Plan and Services                DME Arranged: Continuous passive motion machine DME Agency: [employer] Date DME Agency Contacted:  (ordered/ received prior to surgery)     HH Arranged: PT HH Agency: [facility] [employer] Date [place]:  (ordered/ arranged prior to surgery)      Social Determinants of Health (SDOH) Interventions     Readmission Risk Interventions No flowsheet data found.

## 2019-09-09 NOTE — Progress Notes (Signed)
   Subjective: 1 Day Post-Op Procedure(s) (LRB): RIGHT TOTAL KNEE ARTHROPLASTY (Right) Patient reports pain as moderate.    Objective: Vital signs in last 24 hours: Temp:  [97.2 F (36.2 C)-98.1 F (36.7 C)] 97.8 F (36.6 C) (08/24 0555) Pulse Rate:  [51-80] 68 (08/24 0555) Resp:  [9-20] 20 (08/24 0555) BP: (107-148)/(61-89) 107/64 (08/24 0555) SpO2:  [98 %-100 %] 99 % (08/24 0555) Weight:  [78 kg] 78 kg (08/23 1023)  Intake/Output from previous day: 08/23 0701 - 08/24 0700 In: 4006.6 [P.O.:1180; I.V.:2726.6; IV Piggyback:100] Out: 3000 [Urine:2900; Blood:100] Intake/Output this shift: No intake/output data recorded.  Recent Labs    09/09/19 0309  HGB 11.0*   Recent Labs    09/09/19 0309  WBC 10.8*  RBC 3.30*  HCT 30.8*  PLT 133*   Recent Labs    09/09/19 0309  NA 135  K 4.0  CL 101  CO2 25  BUN 13  CREATININE 0.94  GLUCOSE 169*  CALCIUM 8.4*   No results for input(s): LABPT, INR in the last 72 hours.  Neurologically intact DG Knee 1-2 Views Right  Result Date: 09/08/2019 CLINICAL DATA:  Postop knee replacement EXAM: RIGHT KNEE - 1-2 VIEW COMPARISON:  08/07/2019 FINDINGS: Status post right knee replacement with intact hardware and normal alignment. Gas within the soft tissues consistent with recent surgery. IMPRESSION: Status post right knee replacement with expected postsurgical changes. Electronically Signed   By: Donavan Foil M.D.   On: 09/08/2019 15:58    Assessment/Plan: 1 Day Post-Op Procedure(s) (LRB): RIGHT TOTAL KNEE ARTHROPLASTY (Right) Up with therapy, got light headed when he stood.  Home today if does OK with therapy. HHPT ordered.   Marybelle Killings 09/09/2019, 7:41 AM

## 2019-09-09 NOTE — Progress Notes (Signed)
Physical Therapy Treatment Patient Details Name: Brendan Holland MRN: [id number] DOB: 07-19-1950 Today's Date: 09/09/2019    History of Present Illness Pt is a 69 year old male s/p R TKA    PT Comments    Pt ambulated again in hallway and practiced safe stair technique.  Pt provided with HEP handout and had no further questions.  Pt was not dizzy during mobility this afternoon however does report being nervous about going home.  Pt okay to d/c from PT standpoint.   Follow Up Recommendations  Follow surgeon's recommendation for DC plan and follow-up therapies;Home health PT     Equipment Recommendations  None recommended by PT    Recommendations for Other Services       Precautions / Restrictions Precautions Precautions: Fall;Knee Required Braces or Orthoses: Knee Immobilizer - Right Restrictions Weight Bearing Restrictions: No RLE Weight Bearing: Weight bearing as tolerated    Mobility  Bed Mobility Overal bed mobility: Needs Assistance Bed Mobility: Supine to Sit     Supine to sit: Supervision     General bed mobility comments: pt in recliner  Transfers Overall transfer level: Needs assistance Equipment used: Rolling walker (2 wheeled) Transfers: Sit to/from Stand Sit to Stand: Min guard         General transfer comment: verbal cues for UE and LE Positioning  Ambulation/Gait Ambulation/Gait assistance: Min guard Gait Distance (Feet): 180 Feet Assistive device: Rolling walker (2 wheeled) Gait Pattern/deviations: Step-through pattern;Decreased stance time - right;Antalgic     General Gait Details: verbal cues for sequence, RW positioning, posture   Stairs Stairs: Yes Stairs assistance: Min guard Stair Management: Step to pattern;Forwards;Two rails Number of Stairs: 3 General stair comments: verbal cues for sequence and safety; pt performed twice and reports understanding   Wheelchair Mobility    Modified Rankin (Stroke Patients Only)        Balance                                            Cognition Arousal/Alertness: Awake/alert Behavior During Therapy: WFL for tasks assessed/performed Overall Cognitive Status: Within Functional Limits for tasks assessed                                        Exercises     General Comments        Pertinent Vitals/Pain Pain Assessment: 0-10 Pain Score: 6  Faces Pain Scale: Hurts little more Pain Location: right knee/thigh Pain Descriptors / Indicators: Discomfort;Sore Pain Intervention(s): Repositioned;Monitored during session    Home Living Family/patient expects to be discharged to:: Private residence Living Arrangements: Spouse/significant other   Type of Home: House Home Access: Stairs to enter Entrance Stairs-Rails: Right Home Layout: One level Home Equipment: Environmental consultant - 2 wheels      Prior Function Level of Independence: Independent          PT Goals (current goals can now be found in the care plan section) Acute Rehab PT Goals PT Goal Formulation: With patient Time For Goal Achievement: 09/13/19 Potential to Achieve Goals: Good Progress towards PT goals: Progressing toward goals    Frequency    7X/week      PT Plan Current plan remains appropriate    Co-evaluation  AM-PAC PT "6 Clicks" Mobility   Outcome Measure  Help needed turning from your back to your side while in a flat bed without using bedrails?: A Little Help needed moving from lying on your back to sitting on the side of a flat bed without using bedrails?: A Little Help needed moving to and from a bed to a chair (including a wheelchair)?: A Little Help needed standing up from a chair using your arms (e.g., wheelchair or bedside chair)?: A Little Help needed to walk in hospital room?: A Little Help needed climbing 3-5 steps with a railing? : A Little 6 Click Score: 18    End of Session Equipment Utilized During Treatment: Gait  belt Activity Tolerance: Patient tolerated treatment well Patient left: in chair;with call bell/phone within reach;with chair alarm set Nurse Communication: Mobility status PT Visit Diagnosis: Other abnormalities of gait and mobility (R26.89)     Time: 1400-1417 PT Time Calculation (min) (ACUTE ONLY): 17 min  Charges:  $Gait Training: 8-22 mins                     Arlyce Dice, DPT Acute Rehabilitation Services Pager: [phone] Office: [place] 09/09/2019, 2:50 PM

## 2019-09-09 NOTE — Plan of Care (Signed)

## 2019-09-09 NOTE — Evaluation (Signed)
Physical Therapy Evaluation Patient Details Name: Brendan Holland MRN: [id number] DOB: 29-Sep-1950 Today's Date: 09/09/2019   History of Present Illness  Pt is a 69 year old male s/p R TKA  Clinical Impression  Pt is s/p TKA resulting in the deficits listed below (see PT Problem List).  Pt will benefit from skilled PT to increase their independence and safety with mobility to allow discharge to the venue listed below. Pt ambulated in hallway and performed LE exercises.  Pt denies any dizziness during mobility.  Pt will need to practice stairs prior to d/c home.         Follow Up Recommendations Follow surgeon's recommendation for DC plan and follow-up therapies;Home health PT    Equipment Recommendations  None recommended by PT    Recommendations for Other Services       Precautions / Restrictions Precautions Precautions: Fall;Knee Required Braces or Orthoses: Knee Immobilizer - Right Restrictions Weight Bearing Restrictions: No RLE Weight Bearing: Weight bearing as tolerated      Mobility  Bed Mobility Overal bed mobility: Needs Assistance Bed Mobility: Supine to Sit     Supine to sit: Supervision     General bed mobility comments: verbal cues for technique  Transfers Overall transfer level: Needs assistance Equipment used: Rolling walker (2 wheeled) Transfers: Sit to/from Stand Sit to Stand: Min guard         General transfer comment: verbal cues for UE and LE Positioning  Ambulation/Gait Ambulation/Gait assistance: Min guard Gait Distance (Feet): 140 Feet Assistive device: Rolling walker (2 wheeled) Gait Pattern/deviations: Step-to pattern;Step-through pattern;Decreased stance time - right;Antalgic     General Gait Details: verbal cues for sequence, RW positioning, posture; pt denies any dizziness  Stairs            Wheelchair Mobility    Modified Rankin (Stroke Patients Only)       Balance                                              Pertinent Vitals/Pain Pain Assessment: Faces Faces Pain Scale: Hurts little more Pain Location: right knee with exercises Pain Descriptors / Indicators: Discomfort;Sore Pain Intervention(s): Limited activity within patient's tolerance;Monitored during session;Repositioned    Home Living Family/patient expects to be discharged to:: Private residence Living Arrangements: Spouse/significant other   Type of Home: House Home Access: Stairs to enter Entrance Stairs-Rails: Right Entrance Stairs-Number of Steps: 8 Home Layout: One level Home Equipment: Environmental consultant - 2 wheels      Prior Function Level of Independence: Independent               Hand Dominance        Extremity/Trunk Assessment                Communication   Communication: No difficulties  Cognition Arousal/Alertness: Awake/alert Behavior During Therapy: WFL for tasks assessed/performed Overall Cognitive Status: Within Functional Limits for tasks assessed                                        General Comments      Exercises Total Joint Exercises Ankle Circles/Pumps: AROM;Both;10 reps Quad Sets: AROM;10 reps;Right Short Arc Quad: AROM;10 reps;Right Heel Slides: AAROM;Right;10 reps Hip ABduction/ADduction: Right;10 reps;AROM Straight Leg Raises: AAROM;Right;10 reps Goniometric  ROM: approx 45* AAROM right knee during heel slides   Assessment/Plan    PT Assessment Patient needs continued PT services  PT Problem List Decreased strength;Decreased mobility;Decreased range of motion;Decreased knowledge of use of DME;Pain;Decreased knowledge of precautions       PT Treatment Interventions Stair training;DME instruction;Therapeutic exercise;Balance training;Gait training;Therapeutic activities;Functional mobility training;Patient/family education    PT Goals (Current goals can be found in the Care Plan section)  Acute Rehab PT Goals PT Goal Formulation: With  patient Time For Goal Achievement: 09/13/19 Potential to Achieve Goals: Good    Frequency 7X/week   Barriers to discharge        Co-evaluation               AM-PAC PT "6 Clicks" Mobility  Outcome Measure Help needed turning from your back to your side while in a flat bed without using bedrails?: A Little Help needed moving from lying on your back to sitting on the side of a flat bed without using bedrails?: A Little Help needed moving to and from a bed to a chair (including a wheelchair)?: A Little Help needed standing up from a chair using your arms (e.g., wheelchair or bedside chair)?: A Little Help needed to walk in hospital room?: A Little Help needed climbing 3-5 steps with a railing? : A Little 6 Click Score: 18    End of Session Equipment Utilized During Treatment: Right knee immobilizer;Gait belt Activity Tolerance: Patient tolerated treatment well Patient left: in chair;with call bell/phone within reach (chair alarm set up however no batteries, Network engineer notified) Nurse Communication: Mobility status PT Visit Diagnosis: Other abnormalities of gait and mobility (R26.89)    Time: 5784-6962 PT Time Calculation (min) (ACUTE ONLY): 23 min   Charges:   PT Evaluation $PT Eval Low Complexity: 1 Low PT Treatments $Therapeutic Exercise: 8-22 mins       Jannette Spanner PT, DPT Acute Rehabilitation Services Pager: [phone] Office: [place] 09/09/2019, 11:02 AM

## 2019-09-09 NOTE — Progress Notes (Signed)
Orthopedic Tech Progress Note Patient Details:  Brendan Holland 02-16-1950 [id number]  Patient ID: [name], male   DOB: [birth year], 69 y.o.   MRN: [id number]   [name] 09/09/2019, 4:39 PM CPM PICKUP

## 2019-09-10 ENCOUNTER — Ambulatory Visit: Payer: Medicare Other | Admitting: Surgery

## 2019-09-11 ENCOUNTER — Telehealth: Payer: Self-pay | Admitting: Orthopaedic Surgery

## 2019-09-11 NOTE — Telephone Encounter (Signed)
I left voicemail for Sharyn Lull advising.

## 2019-09-11 NOTE — Telephone Encounter (Signed)
Yes thanks 

## 2019-09-11 NOTE — Telephone Encounter (Signed)
PT Brendan Holland calling from [email or web address]  calling for verbal orders as follow. Patient needs PT Fox for twice a week for 1 week, 3 times a week for 1 week, and twice a week for 1 wk. Brendan Holland phone number is [phone]. If [address] unable to answer leave VM on secure line.

## 2019-09-11 NOTE — Telephone Encounter (Signed)
Ok for orders? 

## 2019-09-15 NOTE — Discharge Summary (Signed)
Patient ID: Brendan Holland MRN: [id number] DOB/AGE: 1950/05/23 69 y.o.  Admit date: 09/08/2019 Discharge date: 09/09/2019 Admission Diagnoses:  Active Problems:   Arthritis of right knee   Discharge Diagnoses:  Active Problems:   Arthritis of right knee  status post Procedure(s): RIGHT TOTAL KNEE ARTHROPLASTY  Past Medical History:  Diagnosis Date  . Arthritis    "arthritis right knee"  . Cancer ([place])    "skin cancer of scalp" -tx with topical meds-"all clear now"  . Dementia ([place])    "pre Alzheimers" "MCI"conitive impairment.  Negative on latest testing  . DVT of lower extremity (deep venous thrombosis) ([place])   . GERD (gastroesophageal reflux disease)   . Headache(784.0)   . Heart rate slow    Avid runner" 30 miles per week"  . Hepatitis C    Tx. Harvoni- 3 yrs ago- "now Clear"  . Sleep apnea    no cpap use today"condition improved".  Most recent study neg  . Stroke [facility])     Surgeries: Procedure(s): RIGHT TOTAL KNEE ARTHROPLASTY on 09/08/2019   Consultants:   Discharged Condition: Improved  Hospital Course: Brendan Holland is an 69 y.o. male who was admitted 09/08/2019 for operative treatment of right knee djd. Patient failed conservative treatments (please see the history and physical for the specifics) and had severe unremitting pain that affects sleep, daily activities and work/hobbies. After pre-op clearance, the patient was taken to the operating room on 09/08/2019 and underwent  Procedure(s): RIGHT TOTAL KNEE ARTHROPLASTY.    Patient was given perioperative antibiotics:  Anti-infectives (From admission, onward)   Start     Dose/Rate Route Frequency Ordered Stop   09/08/19 1030  ceFAZolin (ANCEF) IVPB 2g/100 mL premix        2 g 200 mL/hr over 30 Minutes Intravenous On call to O.R. 09/08/19 1017 09/08/19 1340       Patient was given sequential compression devices and early ambulation to prevent DVT.   Patient benefited maximally from hospital stay and  there were no complications. At the time of discharge, the patient was urinating/moving their bowels without difficulty, tolerating a regular diet, pain is controlled with oral pain medications and they have been cleared by PT/OT.   Recent vital signs: No data found.   Recent laboratory studies: No results for input(s): WBC, HGB, HCT, PLT, NA, K, CL, CO2, BUN, CREATININE, GLUCOSE, INR, CALCIUM in the last 72 hours.  Invalid input(s): PT, 2   Discharge Medications:   Allergies as of 09/09/2019   No Known Allergies     Medication List    STOP taking these medications   aspirin 81 MG tablet Replaced by: aspirin EC 325 MG tablet   Fish Oil 1000 MG Caps   GLUCOSAMINE CHONDR 1500 COMPLX PO   HYDROcodone-acetaminophen 5-325 MG tablet Commonly known as: NORCO/VICODIN   ibuprofen 800 MG tablet Commonly known as: ADVIL   multivitamin with minerals Tabs tablet   predniSONE 5 MG (21) Tbpk tablet Commonly known as: STERAPRED UNI-PAK 21 TAB   ranitidine 150 MG tablet Commonly known as: ZANTAC   traMADol 50 MG tablet Commonly known as: ULTRAM     TAKE these medications   aspirin EC 325 MG tablet Take 1 tablet (325 mg total) by mouth daily. Replaces: aspirin 81 MG tablet   donepezil 10 MG tablet Commonly known as: ARICEPT TAKE 1 TABLET BY MOUTH DAILY What changed: when to take this   methocarbamol 500 MG tablet Commonly known as: Robaxin Take 1  tablet (500 mg total) by mouth every 6 (six) hours as needed for muscle spasms.   omeprazole 40 MG capsule Commonly known as: PRILOSEC Take 40 mg by mouth daily.   oxyCODONE-acetaminophen 5-325 MG tablet Commonly known as: Percocet Take 1 tablet by mouth every 4 (four) hours as needed for severe pain.   simvastatin 10 MG tablet Commonly known as: ZOCOR Take 10 mg by mouth daily.       Diagnostic Studies: DG Knee 1-2 Views Right  Result Date: 09/08/2019 CLINICAL DATA:  Postop knee replacement EXAM: RIGHT KNEE - 1-2 VIEW  COMPARISON:  08/07/2019 FINDINGS: Status post right knee replacement with intact hardware and normal alignment. Gas within the soft tissues consistent with recent surgery. IMPRESSION: Status post right knee replacement with expected postsurgical changes. Electronically Signed   By: Donavan Foil M.D.   On: 09/08/2019 15:58       Follow-up Information    Schedule an appointment as soon as possible for a visit with Marybelle Killings, MD.   Specialty: Orthopedic Surgery Why: need return office visit 2 weeks postop Contact information: [place] [address] [phone]               Discharge Plan:  discharge to home Disposition:     Signed: Benjiman Core 09/15/2019, 10:16 AM

## 2019-09-17 ENCOUNTER — Telehealth: Payer: Self-pay | Admitting: Orthopaedic Surgery

## 2019-09-17 NOTE — Telephone Encounter (Signed)
Wife called.   They are requesting the patient be given 800 mg ibuprofem  Call back: [phone]

## 2019-09-18 MED ORDER — IBUPROFEN 800 MG PO TABS: 800.0000 mg | ORAL_TABLET | Freq: Two times a day (BID) | ORAL | 2 refills | Status: DC | PRN

## 2019-09-18 NOTE — Telephone Encounter (Signed)
Sent to pharmacy. I left voicemail advising.

## 2019-09-18 NOTE — Addendum Note (Signed)
Addended by: Meyer Cory on: 09/18/2019 01:59 PM   Modules accepted: Orders

## 2019-09-18 NOTE — Telephone Encounter (Signed)
Ok to send in thanks

## 2019-09-18 NOTE — Telephone Encounter (Signed)
Please advise 

## 2019-09-19 ENCOUNTER — Other Ambulatory Visit: Payer: Self-pay | Admitting: Surgical

## 2019-09-19 ENCOUNTER — Telehealth: Payer: Self-pay | Admitting: Orthopaedic Surgery

## 2019-09-19 MED ORDER — OXYCODONE-ACETAMINOPHEN 5-325 MG PO TABS
1.0000 | ORAL_TABLET | Freq: Four times a day (QID) | ORAL | 0 refills | Status: DC | PRN
Start: 2019-09-19 — End: 2019-09-30

## 2019-09-19 NOTE — Telephone Encounter (Signed)
Sent in RX

## 2019-09-19 NOTE — Telephone Encounter (Signed)
Patient's wife Fraser Din called advised patient need Rx refilled (Percocet) The number to contact Fraser Din is [phone]

## 2019-09-19 NOTE — Telephone Encounter (Signed)
Could you please advise since Dr. Yates is out of the office? 

## 2019-09-23 ENCOUNTER — Other Ambulatory Visit: Payer: Self-pay | Admitting: Surgery

## 2019-09-23 NOTE — Telephone Encounter (Signed)
Ok to send in # 30 more thanks

## 2019-09-23 NOTE — Telephone Encounter (Signed)
Ok to refill 

## 2019-09-24 ENCOUNTER — Ambulatory Visit (INDEPENDENT_AMBULATORY_CARE_PROVIDER_SITE_OTHER): Payer: Medicare Other | Admitting: Surgery

## 2019-09-24 ENCOUNTER — Other Ambulatory Visit: Payer: Self-pay

## 2019-09-24 ENCOUNTER — Ambulatory Visit: Payer: Self-pay

## 2019-09-24 DIAGNOSIS — Z96651 Presence of right artificial knee joint: Secondary | ICD-10-CM

## 2019-09-24 DIAGNOSIS — G8929 Other chronic pain: Secondary | ICD-10-CM

## 2019-09-24 DIAGNOSIS — M25561 Pain in right knee: Secondary | ICD-10-CM
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
## 2019-09-29 ENCOUNTER — Telehealth: Payer: Self-pay

## 2019-09-29 NOTE — Telephone Encounter (Signed)
Patient wife called he needs oxycodone refilled call back:[phone]

## 2019-09-29 NOTE — Telephone Encounter (Signed)
Please advise 

## 2019-09-30 ENCOUNTER — Other Ambulatory Visit: Payer: Self-pay | Admitting: Orthopaedic Surgery

## 2019-09-30 MED ORDER — HYDROCODONE-ACETAMINOPHEN 5-325 MG PO TABS: 1.0000 | ORAL_TABLET | Freq: Four times a day (QID) | ORAL | 0 refills | Status: DC | PRN
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
## 2019-10-22 ENCOUNTER — Ambulatory Visit (INDEPENDENT_AMBULATORY_CARE_PROVIDER_SITE_OTHER): Payer: Medicare Other | Admitting: Orthopaedic Surgery

## 2019-10-22 ENCOUNTER — Encounter: Payer: Self-pay | Admitting: Orthopaedic Surgery

## 2019-10-22 DIAGNOSIS — Z96651 Presence of right artificial knee joint: Secondary | ICD-10-CM
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
## 2019-10-24 ENCOUNTER — Other Ambulatory Visit: Payer: Self-pay | Admitting: Orthopaedic Surgery

## 2019-10-27 NOTE — Telephone Encounter (Signed)
Please advise 

## 2019-11-19 ENCOUNTER — Other Ambulatory Visit: Payer: Self-pay

## 2019-11-19 ENCOUNTER — Encounter: Payer: Self-pay | Admitting: Orthopaedic Surgery

## 2019-11-19 ENCOUNTER — Ambulatory Visit (INDEPENDENT_AMBULATORY_CARE_PROVIDER_SITE_OTHER): Payer: Medicare Other | Admitting: Orthopaedic Surgery

## 2019-11-19 VITALS — BP 164/82 | HR 85 | Ht 72.0 in | Wt 163.0 lb

## 2019-11-19 DIAGNOSIS — Z96651 Presence of right artificial knee joint: Secondary | ICD-10-CM
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
## 2019-11-20 ENCOUNTER — Other Ambulatory Visit: Payer: Self-pay | Admitting: Family Medicine

## 2019-12-09 ENCOUNTER — Other Ambulatory Visit: Payer: Self-pay | Admitting: Orthopaedic Surgery

## 2019-12-09 NOTE — Telephone Encounter (Signed)
Please advise 

## 2019-12-25 ENCOUNTER — Encounter: Payer: Self-pay | Admitting: Orthopaedic Surgery

## 2019-12-25 ENCOUNTER — Ambulatory Visit (INDEPENDENT_AMBULATORY_CARE_PROVIDER_SITE_OTHER): Payer: Medicare Other | Admitting: Orthopaedic Surgery

## 2019-12-25 ENCOUNTER — Other Ambulatory Visit: Payer: Self-pay

## 2019-12-25 VITALS — Ht 72.0 in | Wt 163.0 lb

## 2019-12-25 DIAGNOSIS — Z96651 Presence of right artificial knee joint: Secondary | ICD-10-CM
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
## 2019-12-26 ENCOUNTER — Other Ambulatory Visit: Payer: Self-pay | Admitting: Family Medicine

## 2019-12-26 ENCOUNTER — Other Ambulatory Visit: Payer: Self-pay | Admitting: Orthopaedic Surgery

## 2019-12-26 ENCOUNTER — Ambulatory Visit: Payer: Medicare Other | Admitting: Orthopaedic Surgery

## 2019-12-26 NOTE — Telephone Encounter (Signed)
Could you please advise since Dr. [name] is out of the office?

## 2019-12-31 ENCOUNTER — Ambulatory Visit: Payer: Medicare Other | Admitting: Orthopaedic Surgery

## 2020-02-03 ENCOUNTER — Ambulatory Visit (INDEPENDENT_AMBULATORY_CARE_PROVIDER_SITE_OTHER): Payer: Medicare Other

## 2020-02-03 ENCOUNTER — Ambulatory Visit (INDEPENDENT_AMBULATORY_CARE_PROVIDER_SITE_OTHER): Payer: Medicare Other | Admitting: Orthopaedic Surgery

## 2020-02-03 DIAGNOSIS — Z96651 Presence of right artificial knee joint: Secondary | ICD-10-CM | POA: Diagnosis not present
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
## 2020-02-04 ENCOUNTER — Other Ambulatory Visit: Payer: Self-pay | Admitting: Family Medicine

## 2020-02-08 ENCOUNTER — Other Ambulatory Visit: Payer: Self-pay | Admitting: Surgical

## 2020-02-08 NOTE — Telephone Encounter (Signed)
[facility] patient

## 2020-02-09 NOTE — Telephone Encounter (Signed)
Please advise 

## 2020-03-10 ENCOUNTER — Other Ambulatory Visit: Payer: Self-pay | Admitting: Internal Medicine

## 2020-03-10 DIAGNOSIS — K7469 Other cirrhosis of liver: Secondary | ICD-10-CM

## 2020-04-02 ENCOUNTER — Other Ambulatory Visit: Payer: Medicare Other

## 2020-04-15 ENCOUNTER — Other Ambulatory Visit: Payer: Self-pay | Admitting: Family Medicine

## 2020-04-15 ENCOUNTER — Other Ambulatory Visit: Payer: Self-pay | Admitting: Orthopaedic Surgery

## 2020-04-20 ENCOUNTER — Ambulatory Visit
Admission: RE | Admit: 2020-04-20 | Discharge: 2020-04-20 | Disposition: A | Discharge status: Home / Self Care | Payer: Medicare Other | Source: Ambulatory Visit | Attending: Internal Medicine | Admitting: Internal Medicine

## 2020-04-20 DIAGNOSIS — K7469 Other cirrhosis of liver: Secondary | ICD-10-CM

## 2020-05-04 ENCOUNTER — Encounter: Payer: Self-pay | Admitting: Orthopaedic Surgery

## 2020-05-04 ENCOUNTER — Ambulatory Visit (INDEPENDENT_AMBULATORY_CARE_PROVIDER_SITE_OTHER): Payer: Medicare Other | Admitting: Orthopaedic Surgery

## 2020-05-04 VITALS — BP 129/79 | HR 61 | Ht 72.0 in | Wt 163.0 lb

## 2020-05-04 DIAGNOSIS — Z96651 Presence of right artificial knee joint: Secondary | ICD-10-CM
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
## 2020-05-21 ENCOUNTER — Other Ambulatory Visit: Payer: Self-pay | Admitting: Family Medicine

## 2020-05-21 ENCOUNTER — Other Ambulatory Visit: Payer: Self-pay | Admitting: Orthopaedic Surgery

## 2020-06-27 ENCOUNTER — Encounter: Payer: Self-pay | Admitting: Orthopaedic Surgery

## 2020-07-02 ENCOUNTER — Encounter: Payer: Self-pay | Admitting: Orthopaedic Surgery

## 2020-07-02 ENCOUNTER — Ambulatory Visit (INDEPENDENT_AMBULATORY_CARE_PROVIDER_SITE_OTHER): Payer: Medicare Other | Admitting: Orthopaedic Surgery

## 2020-07-02 ENCOUNTER — Ambulatory Visit (INDEPENDENT_AMBULATORY_CARE_PROVIDER_SITE_OTHER): Payer: Medicare Other

## 2020-07-02 VITALS — BP 126/65 | HR 59 | Ht 72.0 in | Wt 170.0 lb

## 2020-07-02 DIAGNOSIS — M25561 Pain in right knee: Secondary | ICD-10-CM | POA: Diagnosis not present

## 2020-07-02 DIAGNOSIS — G8929 Other chronic pain: Secondary | ICD-10-CM | POA: Diagnosis not present

## 2020-07-02 DIAGNOSIS — M25461 Effusion, right knee: Secondary | ICD-10-CM

## 2020-07-02 MED ORDER — LIDOCAINE HCL 1 % IJ SOLN
0.5000 mL | INTRAMUSCULAR | Status: AC | PRN
  Administered 2020-07-02: .5 mL
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
## 2020-07-02 NOTE — Addendum Note (Signed)
Addended by: Meyer Cory on: 07/02/2020 04:02 PM   Modules accepted: Orders

## 2020-07-04 ENCOUNTER — Other Ambulatory Visit: Payer: Self-pay | Admitting: Family Medicine

## 2020-07-08 ENCOUNTER — Other Ambulatory Visit: Payer: Self-pay

## 2020-07-08 ENCOUNTER — Ambulatory Visit (INDEPENDENT_AMBULATORY_CARE_PROVIDER_SITE_OTHER): Payer: Medicare Other | Admitting: Orthopaedic Surgery

## 2020-07-08 VITALS — Ht 72.0 in | Wt 170.0 lb

## 2020-07-08 DIAGNOSIS — Z96651 Presence of right artificial knee joint: Secondary | ICD-10-CM | POA: Diagnosis not present

## 2020-07-08 LAB — ANAEROBIC AND AEROBIC CULTURE
AER RESULT:: NO GROWTH
MICRO NUMBER:: 12021019
MICRO NUMBER:: 12021020
SPECIMEN QUALITY:: ADEQUATE
SPECIMEN QUALITY:: ADEQUATE

## 2020-07-08 LAB — SYNOVIAL FLUID ANALYSIS, COMPLETE
Basophils, %: 0 %
Eosinophils-Synovial: 1 % (ref 0–2)
Lymphocytes-Synovial Fld: 15 % (ref 0–74)
Monocyte/Macrophage: 54 % (ref 0–69)
Neutrophil, Synovial: 30 % — ABNORMAL HIGH (ref 0–24)
Synoviocytes, %: 0 % (ref 0–15)
WBC, Synovial: 905 cells/uL — ABNORMAL HIGH (ref <150)

## 2020-07-08 MED ORDER — PREDNISONE 10 MG PO TABS: ORAL_TABLET | ORAL | 1 refills | Status: DC
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
## 2020-07-15 ENCOUNTER — Other Ambulatory Visit: Payer: Self-pay | Admitting: Surgery

## 2020-07-15 NOTE — Telephone Encounter (Signed)
Please advise 

## 2020-08-03 ENCOUNTER — Encounter: Payer: Self-pay | Admitting: Orthopaedic Surgery

## 2020-08-03 ENCOUNTER — Other Ambulatory Visit: Payer: Self-pay

## 2020-08-03 ENCOUNTER — Ambulatory Visit (INDEPENDENT_AMBULATORY_CARE_PROVIDER_SITE_OTHER): Payer: Medicare Other | Admitting: Orthopaedic Surgery

## 2020-08-03 VITALS — BP 155/65 | HR 57 | Ht 72.0 in | Wt 170.0 lb

## 2020-08-03 DIAGNOSIS — Z96651 Presence of right artificial knee joint: Secondary | ICD-10-CM | POA: Diagnosis not present
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
## 2020-08-10 ENCOUNTER — Other Ambulatory Visit: Payer: Self-pay | Admitting: Orthopaedic Surgery

## 2020-08-10 NOTE — Telephone Encounter (Signed)
Please advise 

## 2020-08-12 ENCOUNTER — Ambulatory Visit: Payer: Medicare Other | Admitting: Orthopaedic Surgery

## 2020-08-12 ENCOUNTER — Other Ambulatory Visit: Payer: Self-pay

## 2020-08-31 ENCOUNTER — Ambulatory Visit (INDEPENDENT_AMBULATORY_CARE_PROVIDER_SITE_OTHER): Payer: Medicare Other

## 2020-08-31 ENCOUNTER — Ambulatory Visit (INDEPENDENT_AMBULATORY_CARE_PROVIDER_SITE_OTHER): Payer: Medicare Other | Admitting: Orthopaedic Surgery

## 2020-08-31 ENCOUNTER — Encounter: Payer: Self-pay | Admitting: Orthopaedic Surgery

## 2020-08-31 ENCOUNTER — Other Ambulatory Visit: Payer: Self-pay

## 2020-08-31 VITALS — BP 127/84 | HR 65 | Ht 72.0 in | Wt 168.0 lb

## 2020-08-31 DIAGNOSIS — Z96651 Presence of right artificial knee joint: Secondary | ICD-10-CM | POA: Diagnosis not present

## 2020-08-31 DIAGNOSIS — M25551 Pain in right hip: Secondary | ICD-10-CM

## 2020-08-31 MED ORDER — PREDNISONE 10 MG (21) PO TBPK: ORAL_TABLET | ORAL | 0 refills | Status: DC
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
## 2020-09-06 ENCOUNTER — Other Ambulatory Visit: Payer: Self-pay | Admitting: Orthopaedic Surgery

## 2020-09-06 NOTE — Telephone Encounter (Signed)
Please advise 

## 2020-09-29 ENCOUNTER — Other Ambulatory Visit: Payer: Self-pay | Admitting: Orthopaedic Surgery

## 2020-09-29 NOTE — Telephone Encounter (Signed)
Please advise 

## 2020-10-18 ENCOUNTER — Telehealth: Payer: Self-pay | Admitting: Orthopaedic Surgery

## 2020-10-18 NOTE — Telephone Encounter (Signed)
2022 xray reports refaxed to [facility]

## 2020-10-23 ENCOUNTER — Encounter: Payer: Self-pay | Admitting: Orthopaedic Surgery

## 2020-10-29 ENCOUNTER — Other Ambulatory Visit: Payer: Self-pay | Admitting: Orthopaedic Surgery

## 2020-10-29 NOTE — Telephone Encounter (Signed)
Please advise 

## 2020-11-02 ENCOUNTER — Ambulatory Visit: Payer: Medicare Other | Admitting: Orthopaedic Surgery

## 2020-11-03 ENCOUNTER — Ambulatory Visit: Payer: Medicare Other | Admitting: Orthopaedic Surgery

## 2020-11-04 IMAGING — DX DG KNEE 1-2V*R*
2 series · 2 of 2 positions shown · non-contrast
Comparison: 08/07/2019

CLINICAL DATA: Postop knee replacement

EXAM:
RIGHT KNEE - 1-2 VIEW

[knee ap]
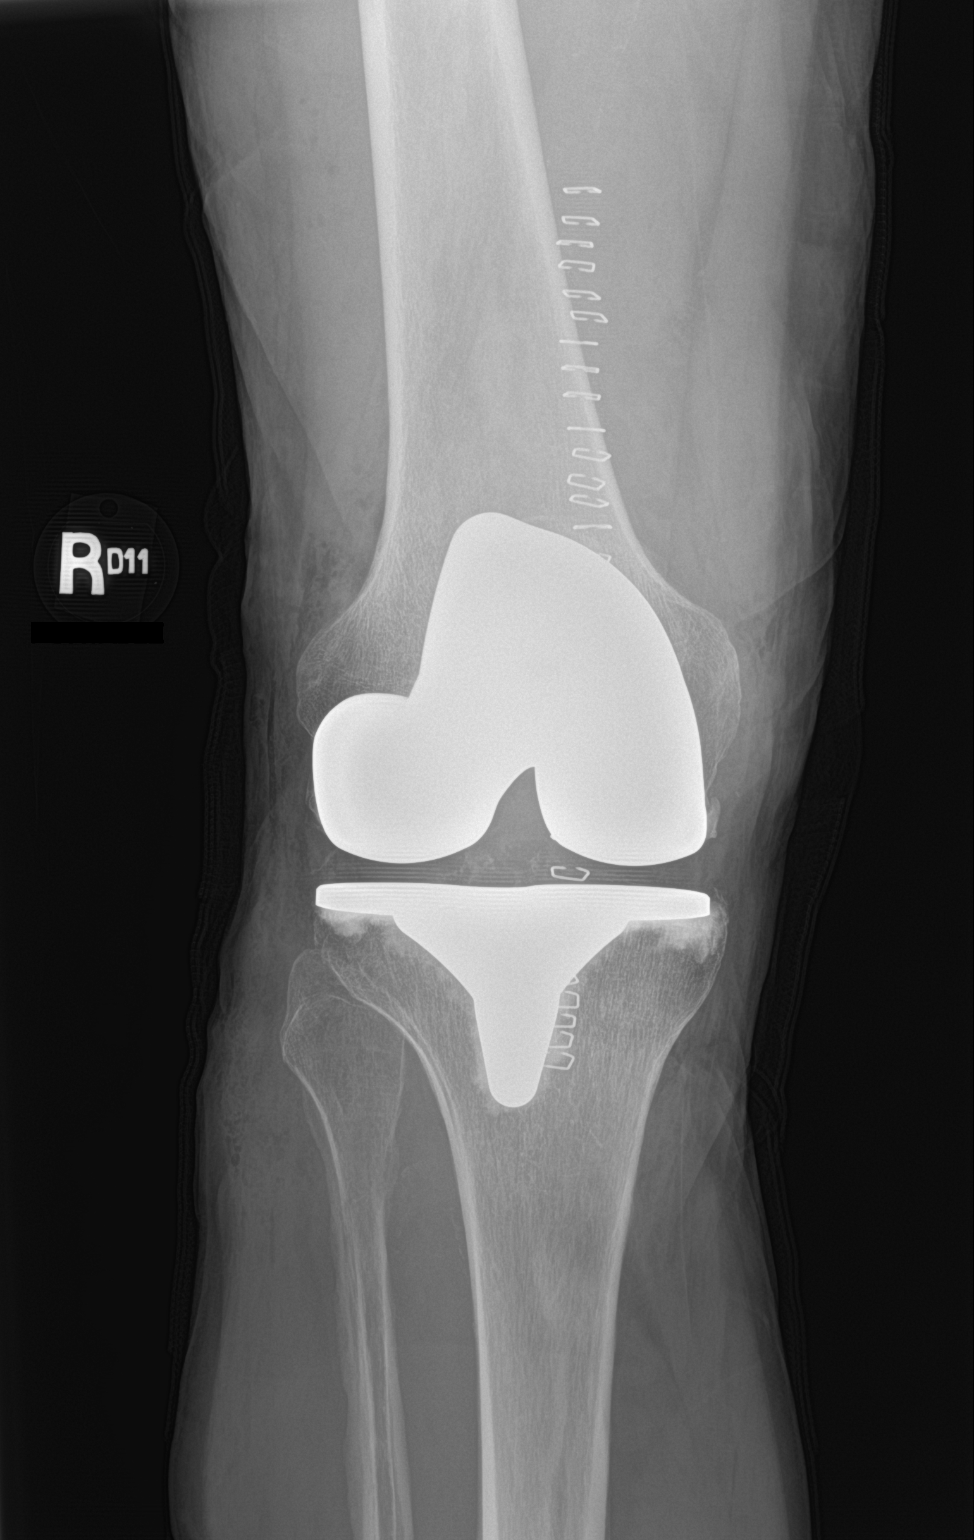

[knee lat]
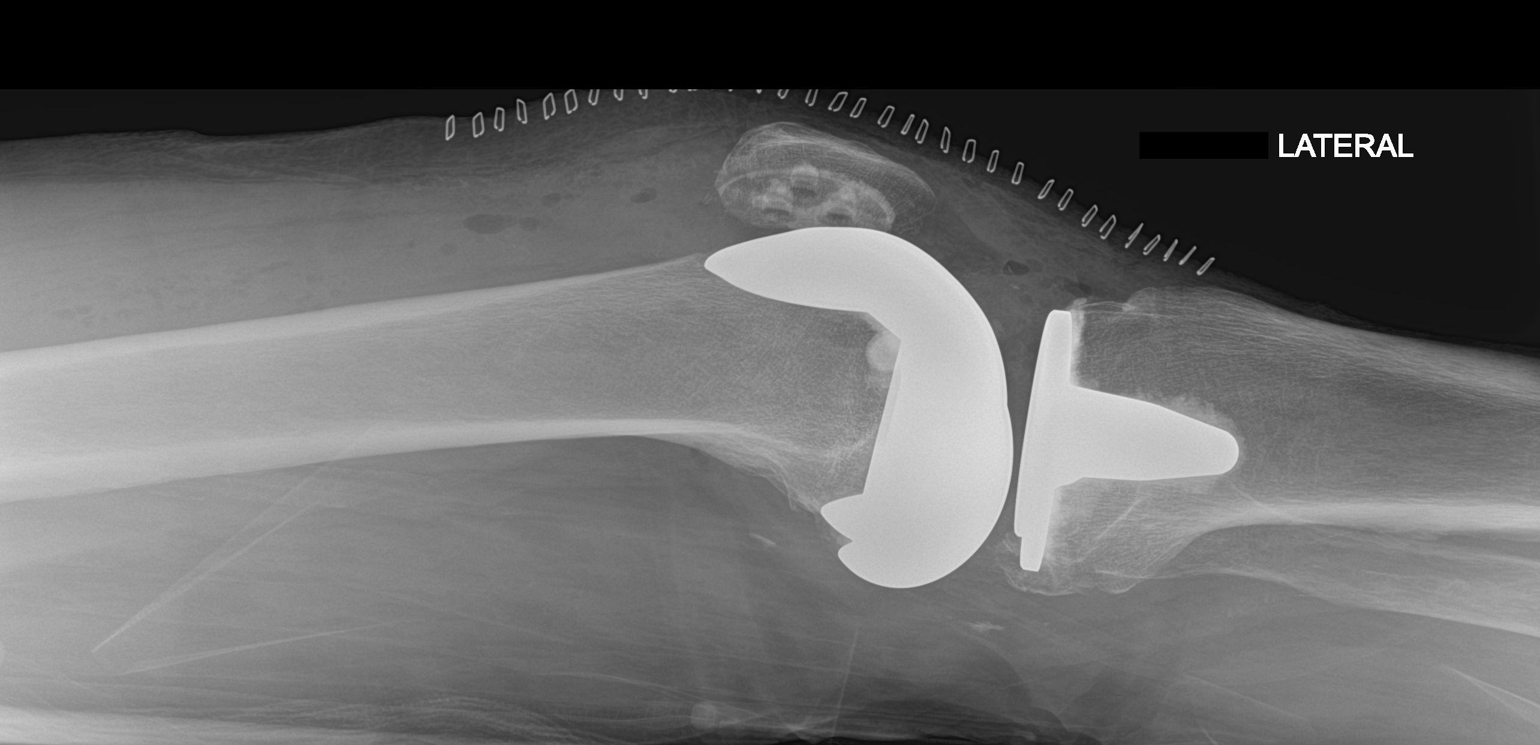

[2 of 2 positions shown; findings below may reference images not displayed]

FINDINGS: Status post right knee replacement with intact hardware and normal
alignment. Gas within the soft tissues consistent with recent
surgery.
IMPRESSION: Status post right knee replacement with expected postsurgical
changes.

## 2020-11-05 ENCOUNTER — Ambulatory Visit (INDEPENDENT_AMBULATORY_CARE_PROVIDER_SITE_OTHER): Payer: Medicare Other | Admitting: Orthopaedic Surgery

## 2020-11-05 ENCOUNTER — Other Ambulatory Visit: Payer: Self-pay

## 2020-11-05 ENCOUNTER — Encounter: Payer: Self-pay | Admitting: Orthopaedic Surgery

## 2020-11-05 DIAGNOSIS — M65341 Trigger finger, right ring finger: Secondary | ICD-10-CM | POA: Diagnosis not present

## 2020-11-05 DIAGNOSIS — M48061 Spinal stenosis, lumbar region without neurogenic claudication: Secondary | ICD-10-CM | POA: Insufficient documentation
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
## 2020-11-25 ENCOUNTER — Ambulatory Visit (INDEPENDENT_AMBULATORY_CARE_PROVIDER_SITE_OTHER): Payer: Medicare Other | Admitting: Physical Medicine and Rehabilitation

## 2020-11-25 ENCOUNTER — Encounter: Payer: Self-pay | Admitting: Physical Medicine and Rehabilitation

## 2020-11-25 ENCOUNTER — Ambulatory Visit: Payer: Self-pay

## 2020-11-25 ENCOUNTER — Other Ambulatory Visit: Payer: Self-pay

## 2020-11-25 VITALS — BP 133/77 | HR 60

## 2020-11-25 DIAGNOSIS — M47816 Spondylosis without myelopathy or radiculopathy, lumbar region: Secondary | ICD-10-CM | POA: Diagnosis not present

## 2020-11-25 MED ORDER — METHYLPREDNISOLONE ACETATE 80 MG/ML IJ SUSP
80.0000 mg | Freq: Once | INTRAMUSCULAR | Status: AC
  Administered 2020-11-25: 80 mg

## 2020-11-25 NOTE — Patient Instructions (Signed)

## 2020-11-25 NOTE — Progress Notes (Signed)
Pt state lower back pain mostly his right side. Pt state walking, standing and laying down makes the pain worse. Pt state he takes over the counter pain meds to help ease his pain.  Numeric Pain Rating Scale and Functional Assessment Average Pain 7   In the last MONTH (on 0-10 scale) has pain interfered with the following?  1. General activity like being  able to carry out your everyday physical activities such as walking, climbing stairs, carrying groceries, or moving a chair?  Rating(10)   +Driver, -BT, -Dye Allergies.

## 2020-12-01 NOTE — Progress Notes (Signed)
DHEERAJ HAIL - 70 y.o. male MRN [id number]  Date of birth: 04/16/1950  Office Visit Note: Visit Date: 11/25/2020 PCP: [name], MD Referred by: [name], MD  Subjective: Chief Complaint  Patient presents with   Lower Back - Pain   HPI:  TYHEIM VANALSTYNE is a 70 y.o. male who comes in today at the request of Dr. Rodell Perna for planned Right L5-S1 Lumbar Transforaminal epidural steroid injection with fluoroscopic guidance.  The patient has failed conservative care including home exercise, medications, time and activity modification.  This injection will be diagnostic and hopefully therapeutic.  Please see requesting physician notes for further details and justification. MRI reviewed with images and spine model.  MRI reviewed in the note below.   ROS Otherwise per HPI.  Assessment & Plan: Visit Diagnoses:    ICD-10-CM   1. Lumbar spondylosis  M47.816 XR C-ARM NO REPORT    Epidural Steroid injection    methylPREDNISolone acetate (DEPO-MEDROL) injection 80 mg      Plan: No additional findings.   Meds & Orders:  Meds ordered this encounter  Medications   methylPREDNISolone acetate (DEPO-MEDROL) injection 80 mg    Orders Placed This Encounter  Procedures   XR C-ARM NO REPORT   Epidural Steroid injection    Follow-up: Return if symptoms worsen or fail to improve.   Procedures: No procedures performed  Lumbosacral Transforaminal Epidural Steroid Injection - Sub-Pedicular Approach with Fluoroscopic Guidance  Patient: JERID CATHERMAN      Date of Birth: 1951-01-14 MRN: [id number] PCP: [name], MD      Visit Date: 11/25/2020   Universal Protocol:    Date/Time: 11/25/2020  Consent Given By: the patient  Position: PRONE  Additional Comments: Vital signs were monitored before and after the procedure. Patient was prepped and draped in the usual sterile fashion. The correct patient, procedure, and site was verified.   Injection Procedure Details:    Procedure diagnoses: Lumbar spondylosis [M47.816]    Meds Administered:  Meds ordered this encounter  Medications   methylPREDNISolone acetate (DEPO-MEDROL) injection 80 mg    Laterality: Right  Location/Site: L5  Needle:5.0 in., 22 ga.  Short bevel or Quincke spinal needle  Needle Placement: Transforaminal  Findings:    -Comments: Excellent flow of contrast along the nerve, nerve root and into the epidural space.  Procedure Details: After squaring off the end-plates to get a true AP view, the C-arm was positioned so that an oblique view of the foramen as noted above was visualized. The target area is just inferior to the "nose of the scotty dog" or sub pedicular. The soft tissues overlying this structure were infiltrated with 2-3 ml. of 1% Lidocaine without Epinephrine.  The spinal needle was inserted toward the target using a "trajectory" view along the fluoroscope beam.  Under AP and lateral visualization, the needle was advanced so it did not puncture dura and was located close the 6 O'Clock position of the pedical in AP tracterory. Biplanar projections were used to confirm position. Aspiration was confirmed to be negative for CSF and/or blood. A 1-2 ml. volume of Isovue-250 was injected and flow of contrast was noted at each level. Radiographs were obtained for documentation purposes.   After attaining the desired flow of contrast documented above, a 0.5 to 1.0 ml test dose of 0.25% Marcaine was injected into each respective transforaminal space.  The patient was observed for 90 seconds post injection.  After no sensory deficits were reported, and normal  lower extremity motor function was noted,   the above injectate was administered so that equal amounts of the injectate were placed at each foramen (level) into the transforaminal epidural space.   Additional Comments:  The patient tolerated the procedure well Dressing: 2 x 2 sterile gauze and Band-Aid    Post-procedure  details: Patient was observed during the procedure. Post-procedure instructions were reviewed.  Patient left the clinic in stable condition.     Clinical History: MRI LUMBAR SPINE WITHOUT CONTRAST   TECHNIQUE: Multiplanar, multisequence MR imaging of the lumbar spine was performed. No intravenous contrast was administered.   COMPARISON:  None.   FINDINGS: Segmentation:  Standard.   Alignment:  Physiologic.   Vertebrae:  No fracture, evidence of discitis, or bone lesion.   Conus medullaris: Extends to the L1-2 level and appears normal.   Paraspinal and other soft tissues: Negative.   Disc levels:   L1-2: No significant disc displacement, foraminal narrowing, or canal stenosis.   L2-3: Small disc bulge. No significant foraminal narrowing or canal stenosis.   L3-4: Small disc bulge with mild bilateral facet and ligamentum flavum hypertrophy. Mild foraminal and lateral recess narrowing. No significant canal stenosis.   L4-5: Small disc bulge with mild facet and ligamentum flavum hypertrophy. Mild bilateral foraminal and lateral recess narrowing. No significant canal stenosis.   L5-S1: Small disc bulge with moderate right mild left facet hypertrophy and marginal endplate osteophytes in the right extraforaminal zone. Moderate to severe right-sided foraminal narrowing and mild left foraminal narrowing. No significant canal stenosis.   IMPRESSION: 1. No acute osseous abnormality or significant canal stenosis. 2. Moderate to severe right-sided L5-S1 foraminal narrowing secondary to disc and facet degenerative changes. 3. Otherwise mild foraminal and lateral recess narrowing from L3 through S1.     Electronically Signed   By: Kristine Garbe M.D.   On: 03/15/2016 16:06     Objective:  VS:  HT:    WT:   BMI:     BP:133/77  HR:60bpm  TEMP: ( )  RESP:  Physical Exam Vitals and nursing note reviewed.  Constitutional:      General: He is not in acute  distress.    Appearance: Normal appearance. He is not ill-appearing.  HENT:     Head: Normocephalic and atraumatic.     Right Ear: External ear normal.     Left Ear: External ear normal.     Nose: No congestion.  Eyes:     Extraocular Movements: Extraocular movements intact.  Cardiovascular:     Rate and Rhythm: Normal rate.     Pulses: Normal pulses.  Pulmonary:     Effort: Pulmonary effort is normal. No respiratory distress.  Abdominal:     General: There is no distension.     Palpations: Abdomen is soft.  Musculoskeletal:        General: No tenderness or signs of injury.     Cervical back: Neck supple.     Right lower leg: No edema.     Left lower leg: No edema.     Comments: Patient has good distal strength without clonus.  Skin:    Findings: No erythema or rash.  Neurological:     General: No focal deficit present.     Mental Status: He is alert and oriented to person, place, and time.     Sensory: No sensory deficit.     Motor: No weakness or abnormal muscle tone.     Coordination: Coordination normal.  Psychiatric:  Mood and Affect: Mood normal.        Behavior: Behavior normal.     Imaging: No results found.

## 2020-12-01 NOTE — Procedures (Signed)
Lumbosacral Transforaminal Epidural Steroid Injection - Sub-Pedicular Approach with Fluoroscopic Guidance  Patient: Brendan Holland      Date of Birth: October 11, 1950 MRN: [id number] PCP: [name], MD      Visit Date: 11/25/2020   Universal Protocol:    Date/Time: 11/25/2020  Consent Given By: the patient  Position: PRONE  Additional Comments: Vital signs were monitored before and after the procedure. Patient was prepped and draped in the usual sterile fashion. The correct patient, procedure, and site was verified.   Injection Procedure Details:   Procedure diagnoses: Lumbar spondylosis [M47.816]    Meds Administered:  Meds ordered this encounter  Medications   methylPREDNISolone acetate (DEPO-MEDROL) injection 80 mg    Laterality: Right  Location/Site: L5  Needle:5.0 in., 22 ga.  Short bevel or Quincke spinal needle  Needle Placement: Transforaminal  Findings:    -Comments: Excellent flow of contrast along the nerve, nerve root and into the epidural space.  Procedure Details: After squaring off the end-plates to get a true AP view, the C-arm was positioned so that an oblique view of the foramen as noted above was visualized. The target area is just inferior to the "nose of the scotty dog" or sub pedicular. The soft tissues overlying this structure were infiltrated with 2-3 ml. of 1% Lidocaine without Epinephrine.  The spinal needle was inserted toward the target using a "trajectory" view along the fluoroscope beam.  Under AP and lateral visualization, the needle was advanced so it did not puncture dura and was located close the 6 O'Clock position of the pedical in AP tracterory. Biplanar projections were used to confirm position. Aspiration was confirmed to be negative for CSF and/or blood. A 1-2 ml. volume of Isovue-250 was injected and flow of contrast was noted at each level. Radiographs were obtained for documentation purposes.   After attaining the desired flow  of contrast documented above, a 0.5 to 1.0 ml test dose of 0.25% Marcaine was injected into each respective transforaminal space.  The patient was observed for 90 seconds post injection.  After no sensory deficits were reported, and normal lower extremity motor function was noted,   the above injectate was administered so that equal amounts of the injectate were placed at each foramen (level) into the transforaminal epidural space.   Additional Comments:  The patient tolerated the procedure well Dressing: 2 x 2 sterile gauze and Band-Aid    Post-procedure details: Patient was observed during the procedure. Post-procedure instructions were reviewed.  Patient left the clinic in stable condition.

## 2020-12-05 ENCOUNTER — Encounter: Payer: Self-pay | Admitting: Orthopaedic Surgery

## 2020-12-05 DIAGNOSIS — M48061 Spinal stenosis, lumbar region without neurogenic claudication: Secondary | ICD-10-CM

## 2020-12-29 ENCOUNTER — Telehealth: Payer: Self-pay | Admitting: Orthopaedic Surgery

## 2020-12-29 NOTE — Telephone Encounter (Signed)
LMOM for pt to return call to sch MRI Lsp review with Dr. [name], after 01/14/21

## 2021-01-14 ENCOUNTER — Ambulatory Visit
Admission: RE | Admit: 2021-01-14 | Discharge: 2021-01-14 | Disposition: A | Discharge status: Home / Self Care | Payer: Medicare Other | Source: Ambulatory Visit | Attending: Orthopaedic Surgery | Admitting: Orthopaedic Surgery

## 2021-01-14 ENCOUNTER — Other Ambulatory Visit: Payer: Self-pay

## 2021-01-14 DIAGNOSIS — M48061 Spinal stenosis, lumbar region without neurogenic claudication: Secondary | ICD-10-CM

## 2021-01-18 ENCOUNTER — Other Ambulatory Visit: Payer: Self-pay

## 2021-01-18 ENCOUNTER — Encounter: Payer: Self-pay | Admitting: Orthopaedic Surgery

## 2021-01-18 ENCOUNTER — Ambulatory Visit (INDEPENDENT_AMBULATORY_CARE_PROVIDER_SITE_OTHER): Payer: Medicare Other | Admitting: Orthopaedic Surgery

## 2021-01-18 DIAGNOSIS — M5416 Radiculopathy, lumbar region: Secondary | ICD-10-CM | POA: Diagnosis not present

## 2021-01-18 DIAGNOSIS — M48061 Spinal stenosis, lumbar region without neurogenic claudication: Secondary | ICD-10-CM | POA: Insufficient documentation
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
## 2021-01-27 ENCOUNTER — Ambulatory Visit (INDEPENDENT_AMBULATORY_CARE_PROVIDER_SITE_OTHER): Payer: Medicare Other | Admitting: Surgery

## 2021-01-27 ENCOUNTER — Other Ambulatory Visit: Payer: Self-pay

## 2021-01-27 ENCOUNTER — Encounter: Payer: Self-pay | Admitting: Surgery

## 2021-01-27 VITALS — BP 130/83 | HR 53 | Temp 98.0°F

## 2021-01-27 DIAGNOSIS — M5416 Radiculopathy, lumbar region: Secondary | ICD-10-CM

## 2021-01-27 DIAGNOSIS — M48061 Spinal stenosis, lumbar region without neurogenic claudication: Secondary | ICD-10-CM
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
## 2021-01-28 NOTE — Pre-Procedure Instructions (Signed)
Surgical Instructions    Your procedure is scheduled on Wednesday, February 02, 2021 at 12:30 PM.  Report to [facility] Main Entrance "A" at 10:30 A.M., then check in with the Admitting office.  Call this number if you have problems the morning of surgery:  [phone]   If you have any questions prior to your surgery date call [phone]: Open Monday-Friday 8am-4pm    Remember:  Do not eat after midnight the night before your surgery  You may drink clear liquids until 9:30 AM the morning of your surgery.   Clear liquids allowed are: Water, Non-Citrus Juices (without pulp), Carbonated Beverages, Clear Tea, Black Coffee Only, and Gatorade    Take these medicines the morning of surgery with A SIP OF WATER:  omeprazole (PRILOSEC) simvastatin (ZOCOR) methocarbamol (ROBAXIN) - if needed  Follow your surgeon's instructions on when to stop Aspirin.  If no instructions were given by your surgeon then you will need to call the office to get those instructions.     As of today, STOP taking any Aleve, Naproxen, Ibuprofen, Motrin, Advil, Goody's, BC's, all herbal medications, fish oil, and all vitamins.                     Do NOT Smoke (Tobacco/Vaping) or drink Alcohol 24 hours prior to your procedure.  If you use a CPAP at night, you may bring all equipment for your overnight stay.   Contacts, glasses, piercing's, hearing aid's, dentures or partials may not be worn into surgery, please bring cases for these belongings.    For patients admitted to the hospital, discharge time will be determined by your treatment team.   Patients discharged the day of surgery will not be allowed to drive home, and someone needs to stay with them for 24 hours.  NO VISITORS WILL BE ALLOWED IN PRE-OP WHERE PATIENTS GET READY FOR SURGERY.  ONLY 1 SUPPORT PERSON MAY BE PRESENT IN THE WAITING ROOM WHILE YOU ARE IN SURGERY.  IF YOU ARE TO BE ADMITTED, ONCE YOU ARE IN YOUR ROOM YOU WILL BE ALLOWED TWO (2)  VISITORS.  Minor children may have two parents present. Special consideration for safety and communication needs will be reviewed on a case by case basis.   Special instructions:   [place]- Preparing For Surgery  Before surgery, you can play an important role. Because skin is not sterile, your skin needs to be as free of germs as possible. You can reduce the number of germs on your skin by washing with CHG (chlorahexidine gluconate) Soap before surgery.  CHG is an antiseptic cleaner which kills germs and bonds with the skin to continue killing germs even after washing.    Oral Hygiene is also important to reduce your risk of infection.  Remember - BRUSH YOUR TEETH THE MORNING OF SURGERY WITH YOUR REGULAR TOOTHPASTE  Please do not use if you have an allergy to CHG or antibacterial soaps. If your skin becomes reddened/irritated stop using the CHG.  Do not shave (including legs and underarms) for at least 48 hours prior to first CHG shower. It is OK to shave your face.  Please follow these instructions carefully.   Shower the NIGHT BEFORE SURGERY and the MORNING OF SURGERY  If you chose to wash your hair, wash your hair first as usual with your normal shampoo.  After you shampoo, rinse your hair and body thoroughly to remove the shampoo.  Use CHG Soap as you would any other liquid  soap. You can apply CHG directly to the skin and wash gently with a scrungie or a clean washcloth.   Apply the CHG Soap to your body ONLY FROM THE NECK DOWN.  Do not use on open wounds or open sores. Avoid contact with your eyes, ears, mouth and genitals (private parts). Wash Face and genitals (private parts)  with your normal soap.   Wash thoroughly, paying special attention to the area where your surgery will be performed.  Thoroughly rinse your body with warm water from the neck down.  DO NOT shower/wash with your normal soap after using and rinsing off the CHG Soap.  Pat yourself dry with a CLEAN  TOWEL.  Wear CLEAN PAJAMAS to bed the night before surgery  Place CLEAN SHEETS on your bed the night before your surgery  DO NOT SLEEP WITH PETS.   Day of Surgery: Shower with CHG soap. Do not wear jewelry. Do not wear lotions, powders, colognes, or deodorant. Do not shave 48 hours prior to surgery.  Men may shave face and neck. Do not bring valuables to the hospital. [facility] is not responsible for any belongings or valuables. Wear Clean/Comfortable clothing the morning of surgery Remember to brush your teeth WITH YOUR REGULAR TOOTHPASTE.   Please read over the following fact sheets that you were given.   3 days prior to your procedure or After your COVID test   You are not required to quarantine however you are required to wear a well-fitting mask when you are out and around people not in your household. If your mask becomes wet or soiled, replace with a new one.   Wash your hands often with soap and water for 20 seconds or clean your hands with an alcohol-based hand sanitizer that contains at least 60% alcohol.   Do not share personal items.   Notify your provider:  o if you are in close contact with someone who has COVID  o or if you develop a fever of 100.4 or greater, sneezing, cough, sore throat, shortness of breath or body aches.

## 2021-01-31 ENCOUNTER — Encounter (HOSPITAL_COMMUNITY)
Admission: RE | Admit: 2021-01-31 | Discharge: 2021-01-31 | Disposition: A | Discharge status: Home / Self Care | Payer: Medicare Other | Source: Ambulatory Visit | Attending: Orthopaedic Surgery | Admitting: Orthopaedic Surgery

## 2021-01-31 ENCOUNTER — Other Ambulatory Visit: Payer: Self-pay

## 2021-01-31 ENCOUNTER — Encounter (HOSPITAL_COMMUNITY): Payer: Self-pay

## 2021-01-31 DIAGNOSIS — G4733 Obstructive sleep apnea (adult) (pediatric): Secondary | ICD-10-CM | POA: Insufficient documentation

## 2021-01-31 DIAGNOSIS — F0390 Unspecified dementia without behavioral disturbance: Secondary | ICD-10-CM | POA: Diagnosis not present

## 2021-01-31 DIAGNOSIS — Z20822 Contact with and (suspected) exposure to covid-19: Secondary | ICD-10-CM | POA: Insufficient documentation

## 2021-01-31 DIAGNOSIS — M48061 Spinal stenosis, lumbar region without neurogenic claudication: Secondary | ICD-10-CM | POA: Diagnosis not present

## 2021-01-31 DIAGNOSIS — K219 Gastro-esophageal reflux disease without esophagitis: Secondary | ICD-10-CM | POA: Diagnosis not present

## 2021-01-31 DIAGNOSIS — Z8673 Personal history of transient ischemic attack (TIA), and cerebral infarction without residual deficits: Secondary | ICD-10-CM | POA: Insufficient documentation

## 2021-01-31 DIAGNOSIS — Z96651 Presence of right artificial knee joint: Secondary | ICD-10-CM | POA: Diagnosis not present

## 2021-01-31 DIAGNOSIS — Z01812 Encounter for preprocedural laboratory examination: Secondary | ICD-10-CM | POA: Insufficient documentation

## 2021-01-31 DIAGNOSIS — Z87891 Personal history of nicotine dependence: Secondary | ICD-10-CM | POA: Insufficient documentation

## 2021-01-31 DIAGNOSIS — I8392 Asymptomatic varicose veins of left lower extremity: Secondary | ICD-10-CM | POA: Insufficient documentation

## 2021-01-31 DIAGNOSIS — Z01818 Encounter for other preprocedural examination: Secondary | ICD-10-CM

## 2021-01-31 DIAGNOSIS — M8568 Other cyst of bone, other site: Secondary | ICD-10-CM | POA: Insufficient documentation

## 2021-01-31 LAB — SARS CORONAVIRUS 2 (TAT 6-24 HRS): SARS Coronavirus 2: NEGATIVE

## 2021-01-31 LAB — COMPREHENSIVE METABOLIC PANEL
ALT: 25 U/L (ref 0–44)
AST: 23 U/L (ref 15–41)
Albumin: 3.7 g/dL (ref 3.5–5.0)
Alkaline Phosphatase: 57 U/L (ref 38–126)
Anion gap: 10 (ref 5–15)
BUN: 10 mg/dL (ref 8–23)
CO2: 27 mmol/L (ref 22–32)
Calcium: 9.4 mg/dL (ref 8.9–10.3)
Chloride: 103 mmol/L (ref 98–111)
Creatinine, Ser: 0.97 mg/dL (ref 0.61–1.24)
GFR, Estimated: >60 mL/min (ref >60)
Glucose, Bld: 89 mg/dL (ref 70–99)
Potassium: 4.2 mmol/L (ref 3.5–5.1)
Sodium: 140 mmol/L (ref 135–145)
Total Bilirubin: 1.1 mg/dL (ref 0.3–1.2)
Total Protein: 7.5 g/dL (ref 6.5–8.1)

## 2021-01-31 LAB — CBC
HCT: 44.6 % (ref 39.0–52.0)
Hemoglobin: 15.2 g/dL (ref 13.0–17.0)
MCH: 32.9 pg (ref 26.0–34.0)
MCHC: 34.1 g/dL (ref 30.0–36.0)
MCV: 96.5 fL (ref 80.0–100.0)
Platelets: 197 10*3/uL (ref 150–400)
RBC: 4.62 MIL/uL (ref 4.22–5.81)
RDW: 12.2 % (ref 11.5–15.5)
WBC: 7.1 10*3/uL (ref 4.0–10.5)
nRBC: 0 % (ref 0.0–0.2)

## 2021-01-31 LAB — SURGICAL PCR SCREEN
MRSA, PCR: NEGATIVE
Staphylococcus aureus: NEGATIVE

## 2021-01-31 NOTE — Progress Notes (Signed)
PCP - Mishi Glennon Mac Cardiologist -  Marina Goodell ([facility]) Last office visit 12/02/20  Chest x-ray - n/a EKG - 12/02/20 ECHO - 6/21  SA - yes does not wear CPAP  Aspirin Instructions: follow your surgeon's instructions on when to stop taking your ASA  ERAS Protcol - yes, Ensure ordered & given   COVID TEST- 01/31/21   Anesthesia review: yes, heart history EKG tracing requested from cardiologist. Jeneen Rinks notified.  No need to repeat EKG today at PAT appointment, per Jeneen Rinks.   Patient denies shortness of breath, fever, cough and chest pain at PAT appointment   All instructions explained to the patient, with a verbal understanding of the material. Patient agrees to go over the instructions while at home for a better understanding. Patient also instructed to self quarantine after being tested for COVID-19. The opportunity to ask questions was provided.

## 2021-01-31 NOTE — Pre-Procedure Instructions (Signed)
Surgical Instructions    Your procedure is scheduled on Wednesday, February 02, 2021 at 12:30 PM.  Report to [facility] Main Entrance "A" at 10:30 A.M., then check in with the Admitting office.  Call this number if you have problems the morning of surgery:  [phone]   If you have any questions prior to your surgery date call [phone]: Open Monday-Friday 8am-4pm    Remember:  Do not eat after midnight the night before your surgery  You may drink clear liquids until 9:30 AM the morning of your surgery.   Clear liquids allowed are: Water, Non-Citrus Juices (without pulp), Carbonated Beverages, Clear Tea, Black Coffee Only, and Gatorade  Please complete your PRE-SURGERY ENSURE that was provided to you by 9:30 AM the morning of surgery.  Please, if able, drink it in one setting. DO NOT SIP.     Take these medicines the morning of surgery with A SIP OF WATER:  omeprazole (PRILOSEC) simvastatin (ZOCOR) methocarbamol (ROBAXIN) - if needed  Follow your surgeon's instructions on when to stop Aspirin.  If no instructions were given by your surgeon then you will need to call the office to get those instructions.     As of today, STOP taking any Aleve, Naproxen, Ibuprofen, Motrin, Advil, Goody's, BC's, all herbal medications, fish oil, and all vitamins.                     Do NOT Smoke (Tobacco/Vaping) or drink Alcohol 24 hours prior to your procedure.  If you use a CPAP at night, you may bring all equipment for your overnight stay.   Contacts, glasses, piercing's, hearing aid's, dentures or partials may not be worn into surgery, please bring cases for these belongings.    For patients admitted to the hospital, discharge time will be determined by your treatment team.   Patients discharged the day of surgery will not be allowed to drive home, and someone needs to stay with them for 24 hours.  NO VISITORS WILL BE ALLOWED IN PRE-OP WHERE PATIENTS GET READY FOR SURGERY.  ONLY 1  SUPPORT PERSON MAY BE PRESENT IN THE WAITING ROOM WHILE YOU ARE IN SURGERY.  IF YOU ARE TO BE ADMITTED, ONCE YOU ARE IN YOUR ROOM YOU WILL BE ALLOWED TWO (2) VISITORS.  Minor children may have two parents present. Special consideration for safety and communication needs will be reviewed on a case by case basis.   Special instructions:   [place]- Preparing For Surgery  Before surgery, you can play an important role. Because skin is not sterile, your skin needs to be as free of germs as possible. You can reduce the number of germs on your skin by washing with CHG (chlorahexidine gluconate) Soap before surgery.  CHG is an antiseptic cleaner which kills germs and bonds with the skin to continue killing germs even after washing.    Oral Hygiene is also important to reduce your risk of infection.  Remember - BRUSH YOUR TEETH THE MORNING OF SURGERY WITH YOUR REGULAR TOOTHPASTE  Please do not use if you have an allergy to CHG or antibacterial soaps. If your skin becomes reddened/irritated stop using the CHG.  Do not shave (including legs and underarms) for at least 48 hours prior to first CHG shower. It is OK to shave your face.  Please follow these instructions carefully.   Shower the NIGHT BEFORE SURGERY and the MORNING OF SURGERY  If you chose to wash your hair, wash your hair  first as usual with your normal shampoo.  After you shampoo, rinse your hair and body thoroughly to remove the shampoo.  Use CHG Soap as you would any other liquid soap. You can apply CHG directly to the skin and wash gently with a scrungie or a clean washcloth.   Apply the CHG Soap to your body ONLY FROM THE NECK DOWN.  Do not use on open wounds or open sores. Avoid contact with your eyes, ears, mouth and genitals (private parts). Wash Face and genitals (private parts)  with your normal soap.   Wash thoroughly, paying special attention to the area where your surgery will be performed.  Thoroughly rinse your body with  warm water from the neck down.  DO NOT shower/wash with your normal soap after using and rinsing off the CHG Soap.  Pat yourself dry with a CLEAN TOWEL.  Wear CLEAN PAJAMAS to bed the night before surgery  Place CLEAN SHEETS on your bed the night before your surgery  DO NOT SLEEP WITH PETS.   Day of Surgery: Shower with CHG soap. Do not wear jewelry. Do not wear lotions, powders, colognes, or deodorant. Men may shave face and neck. Do not bring valuables to the hospital. [facility] is not responsible for any belongings or valuables. Wear Clean/Comfortable clothing the morning of surgery Remember to brush your teeth WITH YOUR REGULAR TOOTHPASTE.   Please read over the following fact sheets that you were given.   3 days prior to your procedure or After your COVID test   You are not required to quarantine however you are required to wear a well-fitting mask when you are out and around people not in your household. If your mask becomes wet or soiled, replace with a new one.   Wash your hands often with soap and water for 20 seconds or clean your hands with an alcohol-based hand sanitizer that contains at least 60% alcohol.   Do not share personal items.   Notify your provider:  o if you are in close contact with someone who has COVID  o or if you develop a fever of 100.4 or greater, sneezing, cough, sore throat, shortness of breath or body aches.
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
## 2021-02-01 NOTE — Anesthesia Preprocedure Evaluation (Addendum)
Anesthesia Evaluation  Patient identified by MRN, date of birth, ID band Patient awake    Reviewed: Allergy & Precautions, NPO status , Patient's Chart, lab work & pertinent test results  Airway Mallampati: I  TM Distance: >3 FB Neck ROM: Full    Dental  (+) Teeth Intact, Dental Advisory Given   Pulmonary sleep apnea , former smoker,    breath sounds clear to auscultation       Cardiovascular Exercise Tolerance: Good  Rhythm:Regular Rate:Normal     Neuro/Psych  Neuromuscular disease CVA    GI/Hepatic GERD  ,(+) Hepatitis -, C  Endo/Other  negative endocrine ROS  Renal/GU negative Renal ROS     Musculoskeletal  (+) Arthritis ,   Abdominal   Peds  Hematology negative hematology ROS (+)   Anesthesia Other Findings   Reproductive/Obstetrics                           Anesthesia Physical Anesthesia Plan  ASA: 2  Anesthesia Plan: General   Post-op Pain Management: Tylenol PO (pre-op), Minimal or no pain anticipated and Toradol IV (intra-op)   Induction:   PONV Risk Score and Plan: 2 and Dexamethasone and Ondansetron  Airway Management Planned: Oral ETT  Additional Equipment: None  Intra-op Plan:   Post-operative Plan: Extubation in OR  Informed Consent: I have reviewed the patients History and Physical, chart, labs and discussed the procedure including the risks, benefits and alternatives for the proposed anesthesia with the patient or authorized representative who has indicated his/her understanding and acceptance.     Dental advisory given  Plan Discussed with:   Anesthesia Plan Comments: (PAT note written 02/01/2021 by Myra Gianotti, PA-C. )       Anesthesia Quick Evaluation

## 2021-02-02 ENCOUNTER — Observation Stay (HOSPITAL_COMMUNITY)
Admission: RE | Admit: 2021-02-02 | Discharge: 2021-02-03 | Disposition: A | Discharge status: Home / Self Care | Payer: Medicare Other | Attending: Orthopaedic Surgery | Admitting: Orthopaedic Surgery

## 2021-02-02 ENCOUNTER — Ambulatory Visit (HOSPITAL_COMMUNITY): Payer: Medicare Other | Admitting: Certified Registered Nurse Anesthetist

## 2021-02-02 ENCOUNTER — Other Ambulatory Visit: Payer: Self-pay

## 2021-02-02 ENCOUNTER — Encounter (HOSPITAL_COMMUNITY): Payer: Self-pay | Admitting: Orthopaedic Surgery

## 2021-02-02 ENCOUNTER — Ambulatory Visit (HOSPITAL_COMMUNITY): Payer: Medicare Other | Admitting: Physician Assistant

## 2021-02-02 ENCOUNTER — Ambulatory Visit (HOSPITAL_COMMUNITY): Payer: Medicare Other

## 2021-02-02 ENCOUNTER — Encounter (HOSPITAL_COMMUNITY)
Admission: RE | Disposition: A | Discharge status: Home / Self Care | Payer: Self-pay | Source: Home / Self Care | Attending: Orthopaedic Surgery

## 2021-02-02 DIAGNOSIS — Z79899 Other long term (current) drug therapy: Secondary | ICD-10-CM | POA: Diagnosis not present

## 2021-02-02 DIAGNOSIS — Z96651 Presence of right artificial knee joint: Secondary | ICD-10-CM | POA: Insufficient documentation

## 2021-02-02 DIAGNOSIS — Z85828 Personal history of other malignant neoplasm of skin: Secondary | ICD-10-CM | POA: Insufficient documentation

## 2021-02-02 DIAGNOSIS — Z86718 Personal history of other venous thrombosis and embolism: Secondary | ICD-10-CM | POA: Insufficient documentation

## 2021-02-02 DIAGNOSIS — Z87891 Personal history of nicotine dependence: Secondary | ICD-10-CM | POA: Diagnosis not present

## 2021-02-02 DIAGNOSIS — M48061 Spinal stenosis, lumbar region without neurogenic claudication: Secondary | ICD-10-CM | POA: Diagnosis not present

## 2021-02-02 DIAGNOSIS — M7138 Other bursal cyst, other site: Principal | ICD-10-CM | POA: Insufficient documentation

## 2021-02-02 DIAGNOSIS — Z7982 Long term (current) use of aspirin: Secondary | ICD-10-CM | POA: Diagnosis not present

## 2021-02-02 DIAGNOSIS — Z419 Encounter for procedure for purposes other than remedying health state, unspecified: Secondary | ICD-10-CM

## 2021-02-02 DIAGNOSIS — Z01818 Encounter for other preprocedural examination: Secondary | ICD-10-CM

## 2021-02-02 DIAGNOSIS — M48062 Spinal stenosis, lumbar region with neurogenic claudication: Secondary | ICD-10-CM

## 2021-02-02 HISTORY — PX: LUMBAR LAMINECTOMY: SHX95

## 2021-02-02 SURGERY — MICRODISCECTOMY LUMBAR LAMINECTOMY
Anesthesia: General | Site: Spine Lumbar

## 2021-02-02 MED ORDER — EPHEDRINE SULFATE-NACL 50-0.9 MG/10ML-% IV SOSY
PREFILLED_SYRINGE | INTRAVENOUS | Status: DC | PRN
  Administered 2021-02-02 (×2): 10 mg via INTRAVENOUS
  Administered 2021-02-02: 5 mg via INTRAVENOUS

## 2021-02-02 MED ORDER — ACETAMINOPHEN 650 MG RE SUPP: 650.0000 mg | RECTAL | Status: DC | PRN

## 2021-02-02 MED ORDER — ONDANSETRON HCL 4 MG PO TABS: 4.0000 mg | ORAL_TABLET | Freq: Four times a day (QID) | ORAL | Status: DC | PRN

## 2021-02-02 MED ORDER — SODIUM CHLORIDE 0.9 % IV SOLN: 250.0000 mL | INTRAVENOUS | Status: DC

## 2021-02-02 MED ORDER — CHLORHEXIDINE GLUCONATE 0.12 % MT SOLN: 15.0000 mL | Freq: Once | OROMUCOSAL | Status: AC

## 2021-02-02 MED ORDER — PANTOPRAZOLE SODIUM 40 MG PO TBEC
40.0000 mg | DELAYED_RELEASE_TABLET | Freq: Every day | ORAL | Status: DC
  Administered 2021-02-03: 40 mg via ORAL
  Filled 2021-02-02: qty 1

## 2021-02-02 MED ORDER — LIDOCAINE 2% (20 MG/ML) 5 ML SYRINGE
INTRAMUSCULAR | Status: DC | PRN
Start: 2021-02-02 — End: 2021-02-02
  Administered 2021-02-02: 60 mg via INTRAVENOUS

## 2021-02-02 MED ORDER — PROPOFOL 10 MG/ML IV BOLUS
INTRAVENOUS | Status: DC | PRN
  Administered 2021-02-02: 150 mg via INTRAVENOUS

## 2021-02-02 MED ORDER — MENTHOL 3 MG MT LOZG: 1.0000 | LOZENGE | OROMUCOSAL | Status: DC | PRN

## 2021-02-02 MED ORDER — SUGAMMADEX SODIUM 200 MG/2ML IV SOLN
INTRAVENOUS | Status: DC | PRN
Start: 2021-02-02 — End: 2021-02-02
  Administered 2021-02-02: 200 mg via INTRAVENOUS

## 2021-02-02 MED ORDER — ONDANSETRON HCL 4 MG/2ML IJ SOLN
INTRAMUSCULAR | Status: AC
  Filled 2021-02-02: qty 2

## 2021-02-02 MED ORDER — POLYETHYLENE GLYCOL 3350 17 G PO PACK: 17.0000 g | PACK | Freq: Every day | ORAL | Status: DC | PRN

## 2021-02-02 MED ORDER — METHOCARBAMOL 500 MG PO TABS
500.0000 mg | ORAL_TABLET | Freq: Four times a day (QID) | ORAL | Status: DC | PRN
  Administered 2021-02-02 (×2): 500 mg via ORAL
  Filled 2021-02-02 (×3): qty 1

## 2021-02-02 MED ORDER — PROPOFOL 10 MG/ML IV BOLUS
INTRAVENOUS | Status: AC
  Filled 2021-02-02: qty 20

## 2021-02-02 MED ORDER — FENTANYL CITRATE (PF) 250 MCG/5ML IJ SOLN
INTRAMUSCULAR | Status: AC
  Filled 2021-02-02: qty 5

## 2021-02-02 MED ORDER — DEXAMETHASONE SODIUM PHOSPHATE 10 MG/ML IJ SOLN
INTRAMUSCULAR | Status: DC | PRN
Start: 2021-02-02 — End: 2021-02-02
  Administered 2021-02-02: 10 mg via INTRAVENOUS

## 2021-02-02 MED ORDER — ORAL CARE MOUTH RINSE: 15.0000 mL | Freq: Once | OROMUCOSAL | Status: AC

## 2021-02-02 MED ORDER — ROCURONIUM BROMIDE 10 MG/ML (PF) SYRINGE
PREFILLED_SYRINGE | INTRAVENOUS | Status: AC
  Filled 2021-02-02: qty 10

## 2021-02-02 MED ORDER — ARTIFICIAL TEARS OPHTHALMIC OINT
TOPICAL_OINTMENT | OPHTHALMIC | Status: AC
  Filled 2021-02-02: qty 7

## 2021-02-02 MED ORDER — SODIUM CHLORIDE 0.9% FLUSH: 3.0000 mL | Freq: Two times a day (BID) | INTRAVENOUS | Status: DC

## 2021-02-02 MED ORDER — SIMVASTATIN 20 MG PO TABS
10.0000 mg | ORAL_TABLET | Freq: Every day | ORAL | Status: DC
  Administered 2021-02-02 – 2021-02-03 (×2): 10 mg via ORAL
  Filled 2021-02-02 (×2): qty 1

## 2021-02-02 MED ORDER — BUPIVACAINE HCL (PF) 0.25 % IJ SOLN
INTRAMUSCULAR | Status: DC | PRN
  Administered 2021-02-02: 10 mL

## 2021-02-02 MED ORDER — ACETAMINOPHEN 325 MG PO TABS: 650.0000 mg | ORAL_TABLET | ORAL | Status: DC | PRN

## 2021-02-02 MED ORDER — FENTANYL CITRATE (PF) 250 MCG/5ML IJ SOLN
INTRAMUSCULAR | Status: DC | PRN
Start: 2021-02-02 — End: 2021-02-02
  Administered 2021-02-02: 100 ug via INTRAVENOUS
  Administered 2021-02-02 (×3): 50 ug via INTRAVENOUS

## 2021-02-02 MED ORDER — ROCURONIUM BROMIDE 10 MG/ML (PF) SYRINGE
PREFILLED_SYRINGE | INTRAVENOUS | Status: DC | PRN
  Administered 2021-02-02: 50 mg via INTRAVENOUS

## 2021-02-02 MED ORDER — CHLORHEXIDINE GLUCONATE 0.12 % MT SOLN
OROMUCOSAL | Status: AC
  Administered 2021-02-02: 15 mL via OROMUCOSAL
  Filled 2021-02-02: qty 15

## 2021-02-02 MED ORDER — HYDROCODONE-ACETAMINOPHEN 7.5-325 MG PO TABS
1.0000 | ORAL_TABLET | ORAL | Status: DC | PRN
  Administered 2021-02-02 – 2021-02-03 (×4): 1 via ORAL
  Filled 2021-02-02 (×4): qty 1

## 2021-02-02 MED ORDER — SODIUM CHLORIDE 0.9 % IV SOLN: INTRAVENOUS | Status: DC

## 2021-02-02 MED ORDER — ACETAMINOPHEN 500 MG PO TABS: 1000.0000 mg | ORAL_TABLET | Freq: Once | ORAL | Status: DC

## 2021-02-02 MED ORDER — 0.9 % SODIUM CHLORIDE (POUR BTL) OPTIME
TOPICAL | Status: DC | PRN
  Administered 2021-02-02: 1000 mL

## 2021-02-02 MED ORDER — SODIUM CHLORIDE 0.9% FLUSH: 3.0000 mL | INTRAVENOUS | Status: DC | PRN

## 2021-02-02 MED ORDER — ONDANSETRON HCL 4 MG/2ML IJ SOLN: 4.0000 mg | Freq: Four times a day (QID) | INTRAMUSCULAR | Status: DC | PRN

## 2021-02-02 MED ORDER — DEXAMETHASONE SODIUM PHOSPHATE 10 MG/ML IJ SOLN
INTRAMUSCULAR | Status: AC
  Filled 2021-02-02: qty 1

## 2021-02-02 MED ORDER — FENTANYL CITRATE (PF) 100 MCG/2ML IJ SOLN: 25.0000 ug | INTRAMUSCULAR | Status: DC | PRN

## 2021-02-02 MED ORDER — PHENOL 1.4 % MT LIQD: 1.0000 | OROMUCOSAL | Status: DC | PRN

## 2021-02-02 MED ORDER — METHOCARBAMOL 1000 MG/10ML IJ SOLN
500.0000 mg | Freq: Four times a day (QID) | INTRAVENOUS | Status: DC | PRN
  Filled 2021-02-02: qty 5

## 2021-02-02 MED ORDER — DONEPEZIL HCL 10 MG PO TABS
10.0000 mg | ORAL_TABLET | Freq: Every day | ORAL | Status: DC
  Administered 2021-02-02: 10 mg via ORAL
  Filled 2021-02-02 (×2): qty 1

## 2021-02-02 MED ORDER — AMISULPRIDE (ANTIEMETIC) 5 MG/2ML IV SOLN: 10.0000 mg | Freq: Once | INTRAVENOUS | Status: DC | PRN

## 2021-02-02 MED ORDER — CEFAZOLIN SODIUM-DEXTROSE 2-4 GM/100ML-% IV SOLN
2.0000 g | INTRAVENOUS | Status: AC
  Administered 2021-02-02: 2 g via INTRAVENOUS
  Filled 2021-02-02: qty 100

## 2021-02-02 MED ORDER — DOCUSATE SODIUM 100 MG PO CAPS
100.0000 mg | ORAL_CAPSULE | Freq: Two times a day (BID) | ORAL | Status: DC
  Administered 2021-02-02 – 2021-02-03 (×2): 100 mg via ORAL
  Filled 2021-02-02 (×2): qty 1

## 2021-02-02 MED ORDER — LACTATED RINGERS IV SOLN: INTRAVENOUS | Status: DC

## 2021-02-02 MED ORDER — ONDANSETRON HCL 4 MG/2ML IJ SOLN
INTRAMUSCULAR | Status: DC | PRN
Start: 2021-02-02 — End: 2021-02-02
  Administered 2021-02-02: 4 mg via INTRAVENOUS

## 2021-02-02 MED ORDER — BUPIVACAINE HCL (PF) 0.25 % IJ SOLN
INTRAMUSCULAR | Status: AC
  Filled 2021-02-02: qty 30

## 2021-02-02 MED ORDER — LIDOCAINE 2% (20 MG/ML) 5 ML SYRINGE
INTRAMUSCULAR | Status: AC
  Filled 2021-02-02: qty 5

## 2021-02-02 SURGICAL SUPPLY — 49 items
ADH SKN CLS APL DERMABOND .7 (GAUZE/BANDAGES/DRESSINGS) ×1
AGENT HMST KT MTR STRL THRMB (HEMOSTASIS) ×1
BAG COUNTER SPONGE SURGICOUNT (BAG) ×3 IMPLANT
BAG SPNG CNTER NS LX DISP (BAG) ×1
BUR ROUND FLUTED 4 SOFT TCH (BURR) IMPLANT
CANISTER SUCT 3000ML PPV (MISCELLANEOUS) ×3 IMPLANT
COVER SURGICAL LIGHT HANDLE (MISCELLANEOUS) ×2 IMPLANT
DERMABOND ADVANCED (GAUZE/BANDAGES/DRESSINGS) ×1
DERMABOND ADVANCED .7 DNX12 (GAUZE/BANDAGES/DRESSINGS) ×2 IMPLANT
DRAPE MICROSCOPE LEICA (MISCELLANEOUS) ×3 IMPLANT
DRSG MEPILEX BORDER 4X4 (GAUZE/BANDAGES/DRESSINGS) IMPLANT
DRSG MEPILEX BORDER 4X8 (GAUZE/BANDAGES/DRESSINGS) ×1 IMPLANT
DURAPREP 26ML APPLICATOR (WOUND CARE) ×3 IMPLANT
DURASEAL SPINE SEALANT 3ML (MISCELLANEOUS) IMPLANT
ELECT REM PT RETURN 9FT ADLT (ELECTROSURGICAL) ×2
ELECTRODE REM PT RTRN 9FT ADLT (ELECTROSURGICAL) ×2 IMPLANT
GAUZE 4X4 16PLY ~~LOC~~+RFID DBL (SPONGE) ×1 IMPLANT
GAUZE SPONGE 4X4 12PLY STRL (GAUZE/BANDAGES/DRESSINGS) ×3 IMPLANT
GLOVE SRG 8 PF TXTR STRL LF DI (GLOVE) ×4 IMPLANT
GLOVE SURG ORTHO LTX SZ7.5 (GLOVE) ×6 IMPLANT
GLOVE SURG UNDER POLY LF SZ6.5 (GLOVE) ×2 IMPLANT
GLOVE SURG UNDER POLY LF SZ8 (GLOVE) ×4
GOWN STRL REUS W/ TWL LRG LVL3 (GOWN DISPOSABLE) ×2 IMPLANT
GOWN STRL REUS W/ TWL XL LVL3 (GOWN DISPOSABLE) ×2 IMPLANT
GOWN STRL REUS W/TWL 2XL LVL3 (GOWN DISPOSABLE) ×3 IMPLANT
GOWN STRL REUS W/TWL LRG LVL3 (GOWN DISPOSABLE) ×2
GOWN STRL REUS W/TWL XL LVL3 (GOWN DISPOSABLE) ×2
KIT BASIN OR (CUSTOM PROCEDURE TRAY) ×3 IMPLANT
KIT TURNOVER KIT B (KITS) ×3 IMPLANT
NDL SPNL 18GX3.5 QUINCKE PK (NEEDLE) ×2 IMPLANT
NEEDLE SPNL 18GX3.5 QUINCKE PK (NEEDLE) ×2 IMPLANT
NS IRRIG 1000ML POUR BTL (IV SOLUTION) ×3 IMPLANT
PACK LAMINECTOMY ORTHO (CUSTOM PROCEDURE TRAY) ×3 IMPLANT
PAD ARMBOARD 7.5X6 YLW CONV (MISCELLANEOUS) ×10 IMPLANT
PATTIES SURGICAL .5 X.5 (GAUZE/BANDAGES/DRESSINGS) IMPLANT
PATTIES SURGICAL .75X.75 (GAUZE/BANDAGES/DRESSINGS) IMPLANT
PIN STEINMANN CT 4.5X250 (PIN) ×1 IMPLANT
SPONGE T-LAP 4X18 ~~LOC~~+RFID (SPONGE) ×2 IMPLANT
STAPLER VISISTAT 35W (STAPLE) ×1 IMPLANT
SURGIFLO W/THROMBIN 8M KIT (HEMOSTASIS) ×1 IMPLANT
SUT VIC AB 1 CTX 36 (SUTURE) ×2
SUT VIC AB 1 CTX36XBRD ANBCTR (SUTURE) ×2 IMPLANT
SUT VIC AB 2-0 CT1 27 (SUTURE) ×2
SUT VIC AB 2-0 CT1 TAPERPNT 27 (SUTURE) ×2 IMPLANT
SUT VIC AB 3-0 X1 27 (SUTURE) IMPLANT
SYR 20ML ECCENTRIC (SYRINGE) IMPLANT
TOWEL GREEN STERILE (TOWEL DISPOSABLE) ×3 IMPLANT
TOWEL GREEN STERILE FF (TOWEL DISPOSABLE) ×3 IMPLANT
WATER STERILE IRR 1000ML POUR (IV SOLUTION) ×3 IMPLANT
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
## 2021-02-02 NOTE — Anesthesia Procedure Notes (Signed)
Procedure Name: Intubation Date/Time: 02/02/2021 12:34 PM Performed by: Lowella Dell, CRNA Pre-anesthesia Checklist: Patient identified, Emergency Drugs available, Suction available and Patient being monitored Patient Re-evaluated:Patient Re-evaluated prior to induction Oxygen Delivery Method: Circle System Utilized Preoxygenation: Pre-oxygenation with 100% oxygen Induction Type: IV induction Ventilation: Mask ventilation without difficulty and Oral airway inserted - appropriate to patient size Laryngoscope Size: Mac and 4 Grade View: Grade I Tube type: Oral Tube size: 7.5 mm Number of attempts: 1 Airway Equipment and Method: Stylet Placement Confirmation: ETT inserted through vocal cords under direct vision, positive ETCO2 and breath sounds checked- equal and bilateral Secured at: 22 cm Tube secured with: Tape Dental Injury: Teeth and Oropharynx as per pre-operative assessment

## 2021-02-02 NOTE — Interval H&P Note (Signed)
History and Physical Interval Note:  02/02/2021 12:14 PM  Brendan Holland  has presented today for surgery, with the diagnosis of L4-5 stenosis, facet cyst.  The various methods of treatment have been discussed with the patient and family. After consideration of risks, benefits and other options for treatment, the patient has consented to  Procedure(s): L4-5 Decompression, Removal of intraspinal extradural facet cyst (N/A) as a surgical intervention.  The patient's history has been reviewed, patient examined, no change in status, stable for surgery.  I have reviewed the patient's chart and labs.  Questions were answered to the patient's satisfaction.     Marybelle Killings

## 2021-02-02 NOTE — Transfer of Care (Signed)
Immediate Anesthesia Transfer of Care Note  Patient: Brendan Holland  Procedure(s) Performed: Lumbar four-five Decompression, Removal of intraspinal extradural facet cyst (Spine Lumbar)  Patient Location: PACU  Anesthesia Type:General  Level of Consciousness: awake, alert  and oriented  Airway & Oxygen Therapy: Patient Spontanous Breathing and Patient connected to face mask oxygen  Post-op Assessment: Report given to RN, Post -op Vital signs reviewed and stable and Patient moving all extremities X 4  Post vital signs: Reviewed and stable  Last Vitals:  Vitals Value Taken Time  BP 174/78 02/02/21 1402  Temp    Pulse 66 02/02/21 1404  Resp 18 02/02/21 1404  SpO2 100 % 02/02/21 1404  Vitals shown include unvalidated device data.  Last Pain:  Vitals:   02/02/21 1110  TempSrc:   PainSc: 0-No pain         Complications: No notable events documented.

## 2021-02-02 NOTE — Anesthesia Postprocedure Evaluation (Signed)
Anesthesia Post Note  Patient: Brendan Holland  Procedure(s) Performed: Lumbar four-five Decompression, Removal of intraspinal extradural facet cyst (Spine Lumbar)     Patient location during evaluation: PACU Anesthesia Type: General Level of consciousness: awake and alert Pain management: pain level controlled Vital Signs Assessment: post-procedure vital signs reviewed and stable Respiratory status: spontaneous breathing, nonlabored ventilation, respiratory function stable and patient connected to nasal cannula oxygen Cardiovascular status: blood pressure returned to baseline and stable Postop Assessment: no apparent nausea or vomiting Anesthetic complications: no   No notable events documented.  Last Vitals:  Vitals:   02/02/21 1453 02/02/21 1521  BP: (!) 159/77 (!) 156/88  Pulse: (!) 59 (!) 57  Resp: 18 18  Temp: (!) 36.1 C   SpO2: 98% 98%    Last Pain:  Vitals:   02/02/21 1535  TempSrc:   PainSc: 6                  Tiajuana Amass

## 2021-02-03 ENCOUNTER — Encounter (HOSPITAL_COMMUNITY): Payer: Self-pay | Admitting: Orthopaedic Surgery

## 2021-02-03 DIAGNOSIS — M7138 Other bursal cyst, other site: Secondary | ICD-10-CM | POA: Diagnosis not present

## 2021-02-03 MED ORDER — HYDROCODONE-ACETAMINOPHEN 5-325 MG PO TABS: 1.0000 | ORAL_TABLET | Freq: Four times a day (QID) | ORAL | 0 refills | Status: DC | PRN

## 2021-02-03 NOTE — Progress Notes (Signed)
Patient awaiting transport via wheelchair by volunteer for discharge home; in no acute distress nor complaints of pain nor discomfort; incision on his back was changed this morning with Mepilex dressing and is clean, dry and intact; room was checked and accounted for all his belongings; discharge instructions concerning his medications, follow up appointment, incision care and when to call the doctor as needed were all discussed with patient and he expressed understanding on the instructions given.

## 2021-02-03 NOTE — TOC Progression Note (Signed)
Transition of Care [facility]) - Progression Note    Patient Details  Name: MARTAVIS GURNEY MRN: [id number] Date of Birth: 02-16-1950  Transition [facility] CM/SW Contact  Brayah Urquilla, Edson Snowball, RN Phone Number: 02/03/2021, 12:50 PM  Clinical Narrative:       Transition of Care (TOC) Screening Note   Patient Details  Name: REMIEL CORTI Date of Birth: 1950/10/30   Transition of Care Department [facility]) has reviewed patient and no TOC needs have been identified at this time. We will continue to monitor patient advancement through interdisciplinary progression rounds. If new patient transition needs arise, please place a TOC consult.        Expected Discharge Plan and Services           Expected Discharge Date: 02/03/21                                     Social Determinants of Health (SDOH) Interventions    Readmission Risk Interventions No flowsheet data found.

## 2021-02-03 NOTE — Progress Notes (Signed)
PT Cancellation Note  Patient Details Name: DUWAN ADRIAN MRN: [id number] DOB: [birth year]   Cancelled Treatment:    Reason Eval/Treat Not Completed: PT screened, no needs identified, will sign off. Pt mobilizing independently per visual observation and OT.   Playita Cortada 02/03/2021, 9:00 AM [place]h [place] Pager [phone] Office [phone]

## 2021-02-03 NOTE — Evaluation (Signed)
Occupational Therapy Evaluation Patient Details Name: Brendan Holland MRN: [id number] DOB: 1950-04-10 Today's Date: 02/03/2021   History of Present Illness This 71 y.o. male admitted for L4-5 decompression and removal of intraspinal extradural facet cyst.  PMH includes: OA, mild cognitive impairment, h/o DVT, hepatitis C. stroke, sleep apnea, s/p TKA.   Clinical Impression   Patient evaluated by Occupational Therapy with no further acute OT needs identified. All education has been completed and the patient has no further questions. Pt demonstrates good awareness of precautions and is able to complete ADLs mod I.  See below for any follow-up Occupational Therapy or equipment needs. OT is signing off. Thank you for this referral.       Recommendations for follow up therapy are one component of a multi-disciplinary discharge planning process, led by the attending physician.  Recommendations may be updated based on patient status, additional functional criteria and insurance authorization.   Follow Up Recommendations  No OT follow up    Assistance Recommended at Discharge Intermittent Supervision/Assistance (with IADLs)  Patient can return home with the following Assist for transportation;Assistance with cooking/housework    Functional Status Assessment  Patient has had a recent decline in their functional status and demonstrates the ability to make significant improvements in function in a reasonable and predictable amount of time.  Equipment Recommendations  None recommended by OT    Recommendations for Other Services       Precautions / Restrictions Precautions Precautions: Back Precaution Booklet Issued: Yes (comment) Precaution Comments: Pt provided with back handout and precautions were reviewed with him. Restrictions Weight Bearing Restrictions: No      Mobility Bed Mobility Overal bed mobility: Modified Independent             General bed mobility comments: Pt  instructed in log rolling    Transfers Overall transfer level: Modified independent                        Balance Overall balance assessment: No apparent balance deficits (not formally assessed)                                         ADL either performed or assessed with clinical judgement   ADL Overall ADL's : Needs assistance/impaired Eating/Feeding: Independent   Grooming: Wash/dry hands;Wash/dry face;Oral care;Brushing hair;Modified independent;Standing Grooming Details (indicate cue type and reason): reviewed safe techniques and back precautions Upper Body Bathing: Modified independent;Standing   Lower Body Bathing: Modified independent;Adhering to back precautions;Sit to/from stand Lower Body Bathing Details (indicate cue type and reason): reviewed safe technique and discussed option for use of LH sponge if needed Upper Body Dressing : Modified independent;Sitting   Lower Body Dressing: Modified independent;Adhering to back precautions;Sit to/from stand Lower Body Dressing Details (indicate cue type and reason): Pt able to access feet without bending or twisting. Toilet Transfer: Modified Independent;Comfort height toilet;Ambulation   Toileting- Clothing Manipulation and Hygiene: Modified independent;Sit to/from stand       Functional mobility during ADLs: Modified independent General ADL Comments: Pt requires occasional cues to avoid twisting, but is aware  of precautions     Vision Patient Visual Report: No change from baseline       Perception     Praxis      Pertinent Vitals/Pain Pain Assessment Pain Assessment: No/denies pain     Hand Dominance Right  Extremity/Trunk Assessment Upper Extremity Assessment Upper Extremity Assessment: Overall WFL for tasks assessed   Lower Extremity Assessment Lower Extremity Assessment: Overall WFL for tasks assessed   Cervical / Trunk Assessment Cervical / Trunk Assessment: Back  Surgery   Communication Communication Communication: No difficulties   Cognition Arousal/Alertness: Awake/alert Behavior During Therapy: WFL for tasks assessed/performed Overall Cognitive Status: Within Functional Limits for tasks assessed                                 General Comments: WFL for tasks assessed     General Comments       Exercises     Shoulder Instructions      Home Living Family/patient expects to be discharged to:: Private residence Living Arrangements: Spouse/significant other Available Help at Discharge: Family;Available PRN/intermittently Type of Home: House Home Access: Stairs to enter [employer] of Steps: 5   Home Layout: One level     Bathroom Shower/Tub: Occupational psychologist: Handicapped height     Home Equipment: Conservation officer, nature (2 wheels);Shower seat;Adaptive equipment Adaptive Equipment: Sock aid Additional Comments: wife has had several back surgeries and is limited with her ability to assist pt      Prior Functioning/Environment Prior Level of Function : Independent/Modified Independent;Driving;Working/employed                        OT Problem List: Decreased knowledge of precautions      OT Treatment/Interventions:      OT Goals(Current goals can be found in the care plan section) Acute Rehab OT Goals Patient Stated Goal: to get back to normal OT Goal Formulation: All assessment and education complete, DC therapy  OT Frequency:      Co-evaluation              AM-PAC OT "6 Clicks" Daily Activity     Outcome Measure Help from another person eating meals?: None Help from another person taking care of personal grooming?: None Help from another person toileting, which includes using toliet, bedpan, or urinal?: None Help from another person bathing (including washing, rinsing, drying)?: None Help from another person to put on and taking off regular upper body clothing?:  None Help from another person to put on and taking off regular lower body clothing?: None 6 Click Score: 24   End of Session Nurse Communication: Mobility status  Activity Tolerance: Patient tolerated treatment well Patient left: in bed;with call bell/phone within reach  OT Visit Diagnosis: Pain Pain - part of body:  (back)                Time: 3382-5053 OT Time Calculation (min): 17 min Charges:  OT General Charges $OT Visit: 1 Visit OT Evaluation $OT Eval Low Complexity: 1 Low  Nilsa Nutting., OTR/L Acute Rehabilitation Services Pager [phone] Office [phone]   Lucille Passy M 02/03/2021, 10:16 AM

## 2021-02-03 NOTE — Progress Notes (Signed)
Patient ID: CASY BRUNETTO, male   DOB: May 10, 1950, 71 y.o.   MRN: [id number]   Subjective: 1 Day Post-Op Procedure(s) (LRB): Lumbar four-five Decompression, Removal of intraspinal extradural facet cyst (N/A) Patient reports pain as mild incisional pain. No right leg pain.   Objective: Vital signs in last 24 hours: Temp:  [97 F (36.1 C)-98 F (36.7 C)] 97.8 F (36.6 C) (01/19 0316) Pulse Rate:  [55-70] 59 (01/19 0316) Resp:  [14-18] 18 (01/19 0316) BP: (108-174)/(64-88) 108/64 (01/19 0316) SpO2:  [96 %-100 %] 98 % (01/19 0316) Weight:  [76.2 kg] 76.2 kg (01/18 1045)  Intake/Output from previous day: 01/18 0701 - 01/19 0700 In: 1100 [I.V.:1000; IV Piggyback:100] Out: 100 [Blood:100] Intake/Output this shift: No intake/output data recorded.  Recent Labs    01/31/21 0835  HGB 15.2   Recent Labs    01/31/21 0835  WBC 7.1  RBC 4.62  HCT 44.6  PLT 197   Recent Labs    01/31/21 0835  NA 140  K 4.2  CL 103  CO2 27  BUN 10  CREATININE 0.97  GLUCOSE 89  CALCIUM 9.4   No results for input(s): LABPT, INR in the last 72 hours.  Neurologically intact DG Lumbar Spine 2-3 Views  Result Date: 02/02/2021 CLINICAL DATA:  Localization L4-L5 decompression. EXAM: LUMBAR SPINE - 2-3 VIEW COMPARISON:  MRI lumbar spine 01/14/2021. FINDINGS: Localization needles are seen posterior to the L4 and L5 levels. No acute fracture. Alignment is anatomic. Moderate disc space narrowing at L5-S1 is similar to the prior study. IMPRESSION: 1. Localization of lumbar spine as above. Electronically Signed   By: Ronney Asters M.D.   On: 02/02/2021 15:32    Assessment/Plan: 1 Day Post-Op Procedure(s) (LRB): Lumbar four-five Decompression, Removal of intraspinal extradural facet cyst (N/A) Plan: discharge Home office one week  Marybelle Killings 02/03/2021, 7:16 AM

## 2021-02-03 NOTE — Discharge Instructions (Signed)
Your dressing is waterproof you can shower and just leave your dressing on to come back in 1 week.  If the dressing happens to come off you can put dry gauze and tape and is placed.  Walk daily.  Avoid turning, lifting, bending.  Pain medication has been sent into your pharmacy.

## 2021-02-03 NOTE — Plan of Care (Signed)

## 2021-02-03 NOTE — Op Note (Signed)
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  Postop diagnosis: Same  Procedure: L4 laminectomy with decompression and removal of intraspinal extradural facet cyst.  Microscope assisted  Surgeon: Rodell Perna MD  Assistant: Benjiman Core, PA-C medically necessary and present for the entire procedure  Anesthesia: General plus Marcaine skin local  EBL: 100 cc.  Findings: Extradural facet cyst occupying greater than 50% of the canal with central and right lateral recess compression at L4-5.  Procedure: After induction of general esthesia patient placed prone on chest rolls careful padding positioning arms at 9090 yellow pads underneath the ulnar nerve and anterior shoulder.  Back was prepped with DuraPrep there is squared with towel sterile skin marker and Betadine Steri-Drape.  Laminectomy sheets timeout procedure.  Preoperative Ancef prophylaxis.  Needle localization spinal needle above and below the planned level of area of decompression based on palpable landmarks confirmed needles were just above and below the L4-5 interspace.  After timeout procedure incision was made.  Kocher clamps were placed second x-ray was taken confirming appropriate decompression and posterior elements were removed and the laminectomy at L4.  Spinous process removed with a rondure and lamina thinned with a 4 mm fluted bur.  Chunks of thick ligament was removed a large cyst was noted with some discoloration from slight hemorrhage.  Thick chunks of ligament were removed on both sides and overhanging spurs.  Using the operative microscope the cyst was peeled off the dura that was somewhat adherent ,and dura was protected with patties and the Barbra Sarks was used with Cushing's picking up the cyst to carefully peel it off the dura.  Remaining chunks of ligament removed in the lateral gutter once the dural tube was round no areas of compression foraminal was free we  checked above and below to make sure there is complete excision of the cyst since extended both above and below the disc space.  Irrigation with saline solution.  Some Surgi-Flo was used in the gutters of small patties bipolar cautery for any epidural bleeding.  Fascia closed with Vicryl 2-0 Vicryl subtenons tissue skin staple closure Marcaine infiltration and postop dressing transferred recovery in stable condition.  Patient had good relief of preop leg pain noted immediately postop.

## 2021-02-06 ENCOUNTER — Encounter: Payer: Self-pay | Admitting: Orthopaedic Surgery

## 2021-02-09 ENCOUNTER — Ambulatory Visit (INDEPENDENT_AMBULATORY_CARE_PROVIDER_SITE_OTHER): Payer: Medicare Other | Admitting: Orthopaedic Surgery

## 2021-02-09 ENCOUNTER — Encounter: Payer: Self-pay | Admitting: Orthopaedic Surgery

## 2021-02-09 ENCOUNTER — Other Ambulatory Visit: Payer: Self-pay

## 2021-02-09 VITALS — BP 158/81 | HR 64 | Ht 72.0 in | Wt 168.0 lb

## 2021-02-09 DIAGNOSIS — M48061 Spinal stenosis, lumbar region without neurogenic claudication: Secondary | ICD-10-CM
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
## 2021-02-14 NOTE — Discharge Summary (Signed)
Patient ID: Brendan Holland MRN: [id number] DOB/AGE: March 02, 1950 71 y.o.  Admit date: 02/02/2021 Discharge date: 02/03/2021  Admission Diagnoses:  Principal Problem:   Lumbar stenosis   Discharge Diagnoses:  Principal Problem:   Lumbar stenosis  status post Procedure(s): Lumbar four-five Decompression, Removal of intraspinal extradural facet cyst  Past Medical History:  Diagnosis Date   Arthritis    "arthritis right knee"   Cancer ([place])    "skin cancer of scalp" -tx with topical meds-"all clear now"   Dementia ([place])    "pre Alzheimers" "MCI"conitive impairment.  Negative on latest testing   DVT of lower extremity (deep venous thrombosis) (HCC)    GERD (gastroesophageal reflux disease)    Headache(784.0)    Heart rate slow    Avid runner" 30 miles per week"   Hepatitis C    Tx. Harvoni- 3 yrs ago- "now Clear"   Sleep apnea    no cpap use today"condition improved".  Most recent study neg   Stroke [facility])     Surgeries: Procedure(s): Lumbar four-five Decompression, Removal of intraspinal extradural facet cyst on 02/02/2021   Consultants:   Discharged Condition: Improved  Hospital Course: CHARES Holland is an 71 y.o. male who was admitted 02/02/2021 for operative treatment of Lumbar stenosis. Patient failed conservative treatments (please see the history and physical for the specifics) and had severe unremitting pain that affects sleep, daily activities and work/hobbies. After pre-op clearance, the patient was taken to the operating room on 02/02/2021 and underwent  Procedure(s): Lumbar four-five Decompression, Removal of intraspinal extradural facet cyst.    Patient was given perioperative antibiotics:  Anti-infectives (From admission, onward)    Start     Dose/Rate Route Frequency Ordered Stop   02/02/21 1100  ceFAZolin (ANCEF) IVPB 2g/100 mL premix        2 g 200 mL/hr over 30 Minutes Intravenous On call to O.R. 02/02/21 1057 02/02/21 1240        Patient was  given sequential compression devices and early ambulation to prevent DVT.   Patient benefited maximally from hospital stay and there were no complications. At the time of discharge, the patient was urinating/moving their bowels without difficulty, tolerating a regular diet, pain is controlled with oral pain medications and they have been cleared by PT/OT.   Recent vital signs: No data found.   Recent laboratory studies: No results for input(s): WBC, HGB, HCT, PLT, NA, K, CL, CO2, BUN, CREATININE, GLUCOSE, INR, CALCIUM in the last 72 hours.  Invalid input(s): PT, 2   Discharge Medications:   Allergies as of 02/03/2021   No Known Allergies      Medication List     STOP taking these medications    ibuprofen 800 MG tablet Commonly known as: ADVIL   traMADol 50 MG tablet Commonly known as: ULTRAM       TAKE these medications    aspirin EC 81 MG tablet Take 81 mg by mouth daily. Swallow whole.   donepezil 10 MG tablet Commonly known as: ARICEPT TAKE 1 TABLET BY MOUTH DAILY What changed: when to take this   Fish Oil 1000 MG Caps Take 1,000 mg by mouth daily.   GLUCOSAMINE CHOND MSM FORMULA PO Take 1 tablet by mouth daily.   HYDROcodone-acetaminophen 5-325 MG tablet Commonly known as: Norco Take 1 tablet by mouth every 6 (six) hours as needed for moderate pain.   methocarbamol 500 MG tablet Commonly known as: ROBAXIN TAKE 1 TABLET BY MOUTH TWICE A DAY  AS NEEDED FOR MUSCLE SPASMS   multivitamin with minerals tablet Take 1 tablet by mouth daily.   omeprazole 40 MG capsule Commonly known as: PRILOSEC Take 40 mg by mouth daily.   simvastatin 10 MG tablet Commonly known as: ZOCOR Take 10 mg by mouth daily.        Diagnostic Studies: DG Lumbar Spine 2-3 Views  Result Date: 02/02/2021 CLINICAL DATA:  Localization L4-L5 decompression. EXAM: LUMBAR SPINE - 2-3 VIEW COMPARISON:  MRI lumbar spine 01/14/2021. FINDINGS: Localization needles are seen posterior to the  L4 and L5 levels. No acute fracture. Alignment is anatomic. Moderate disc space narrowing at L5-S1 is similar to the prior study. IMPRESSION: 1. Localization of lumbar spine as above. Electronically Signed   By: Ronney Asters M.D.   On: 02/02/2021 15:32    Discharge Instructions     Incentive spirometry RT   Complete by: As directed         Follow-up Information     Marybelle Killings, MD Follow up today.   Specialty: Orthopedic Surgery Why: need return office visit one week postop Contact information: [place] [id number] [phone]                 Discharge Plan:  discharge to home  Disposition:     Signed: Benjiman Core  02/14/2021, 10:51 AM

## 2021-02-16 ENCOUNTER — Encounter: Payer: Self-pay | Admitting: Orthopaedic Surgery

## 2021-02-16 ENCOUNTER — Other Ambulatory Visit: Payer: Self-pay

## 2021-02-16 ENCOUNTER — Other Ambulatory Visit: Payer: Self-pay | Admitting: Orthopaedic Surgery

## 2021-02-16 ENCOUNTER — Ambulatory Visit (INDEPENDENT_AMBULATORY_CARE_PROVIDER_SITE_OTHER): Payer: Medicare Other | Admitting: Orthopaedic Surgery

## 2021-02-16 DIAGNOSIS — Z9889 Other specified postprocedural states: Secondary | ICD-10-CM
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
## 2021-03-07 ENCOUNTER — Other Ambulatory Visit: Payer: Self-pay | Admitting: Orthopaedic Surgery

## 2021-03-17 ENCOUNTER — Ambulatory Visit (INDEPENDENT_AMBULATORY_CARE_PROVIDER_SITE_OTHER): Payer: Medicare Other | Admitting: Orthopaedic Surgery

## 2021-03-17 ENCOUNTER — Encounter: Payer: Self-pay | Admitting: Orthopaedic Surgery

## 2021-03-17 ENCOUNTER — Other Ambulatory Visit: Payer: Self-pay

## 2021-03-17 VITALS — Ht 72.0 in | Wt 168.0 lb

## 2021-03-17 DIAGNOSIS — Z9889 Other specified postprocedural states: Secondary | ICD-10-CM
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
## 2021-04-16 ENCOUNTER — Other Ambulatory Visit: Payer: Self-pay | Admitting: Orthopaedic Surgery

## 2021-07-13 ENCOUNTER — Ambulatory Visit: Payer: Medicare Other | Admitting: Surgery

## 2021-07-14 ENCOUNTER — Ambulatory Visit (INDEPENDENT_AMBULATORY_CARE_PROVIDER_SITE_OTHER): Payer: Medicare Other | Admitting: Surgery

## 2021-07-14 ENCOUNTER — Ambulatory Visit: Payer: Medicare Other

## 2021-07-14 DIAGNOSIS — M79641 Pain in right hand: Secondary | ICD-10-CM

## 2021-07-14 DIAGNOSIS — M47816 Spondylosis without myelopathy or radiculopathy, lumbar region: Secondary | ICD-10-CM

## 2021-07-14 DIAGNOSIS — M25552 Pain in left hip: Secondary | ICD-10-CM
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
## 2021-07-28 ENCOUNTER — Ambulatory Visit (INDEPENDENT_AMBULATORY_CARE_PROVIDER_SITE_OTHER): Payer: Medicare Other | Admitting: Orthopedic Surgery

## 2021-07-28 DIAGNOSIS — M720 Palmar fascial fibromatosis [Dupuytren]: Secondary | ICD-10-CM | POA: Diagnosis not present

## 2021-07-28 DIAGNOSIS — M65341 Trigger finger, right ring finger: Secondary | ICD-10-CM | POA: Diagnosis not present

## 2021-07-29 DIAGNOSIS — M720 Palmar fascial fibromatosis [Dupuytren]: Secondary | ICD-10-CM | POA: Insufficient documentation

## 2021-07-29 DIAGNOSIS — M65341 Trigger finger, right ring finger: Secondary | ICD-10-CM | POA: Diagnosis not present

## 2021-07-29 MED ORDER — LIDOCAINE HCL 1 % IJ SOLN
1.0000 mL | INTRAMUSCULAR | Status: AC | PRN
  Administered 2021-07-29: 1 mL

## 2021-07-29 MED ORDER — BETAMETHASONE SOD PHOS & ACET 6 (3-3) MG/ML IJ SUSP
6.0000 mg | INTRAMUSCULAR | Status: AC | PRN
  Administered 2021-07-29: 6 mg via INTRA_ARTICULAR
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
## 2021-09-07 ENCOUNTER — Ambulatory Visit: Payer: Medicare Other | Admitting: Orthopaedic Surgery

## 2021-10-04 ENCOUNTER — Ambulatory Visit (INDEPENDENT_AMBULATORY_CARE_PROVIDER_SITE_OTHER): Payer: Medicare Other | Admitting: Orthopaedic Surgery

## 2021-10-04 ENCOUNTER — Encounter: Payer: Self-pay | Admitting: Orthopaedic Surgery

## 2021-10-04 VITALS — BP 114/71 | HR 62 | Ht 72.0 in | Wt 169.0 lb

## 2021-10-04 DIAGNOSIS — Z96651 Presence of right artificial knee joint: Secondary | ICD-10-CM | POA: Diagnosis not present

## 2021-10-04 DIAGNOSIS — Z9889 Other specified postprocedural states: Secondary | ICD-10-CM

## 2021-10-04 MED ORDER — PREDNISONE 10 MG (21) PO TBPK: ORAL_TABLET | ORAL | 0 refills | Status: DC
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
## 2021-10-09 NOTE — Progress Notes (Signed)
Patient with known history of end-stage DJD right knee and pain comes in for prep evaluation.  Knee symptoms unchanged from previous visit.  He is wanting to proceed with right total knee replacement as scheduled.  Surgical procedure discussed.  All questions answered.

## 2021-10-29 ENCOUNTER — Encounter: Payer: Self-pay | Admitting: Orthopaedic Surgery

## 2021-11-14 ENCOUNTER — Other Ambulatory Visit: Payer: Self-pay | Admitting: Orthopaedic Surgery

## 2022-01-06 ENCOUNTER — Other Ambulatory Visit: Payer: Self-pay | Admitting: Orthopaedic Surgery

## 2022-01-25 ENCOUNTER — Other Ambulatory Visit: Payer: Self-pay | Admitting: Orthopaedic Surgery

## 2022-02-28 ENCOUNTER — Other Ambulatory Visit: Payer: Self-pay | Admitting: Orthopaedic Surgery

## 2022-06-03 ENCOUNTER — Other Ambulatory Visit: Payer: Self-pay | Admitting: Orthopaedic Surgery

## 2022-06-03 ENCOUNTER — Other Ambulatory Visit: Payer: Self-pay | Admitting: Orthopedic Surgery

## 2022-06-03 ENCOUNTER — Encounter: Payer: Self-pay | Admitting: Orthopaedic Surgery

## 2022-06-07 MED ORDER — METHOCARBAMOL 500 MG PO TABS: 500.0000 mg | ORAL_TABLET | Freq: Every day | ORAL | 0 refills | Status: DC | PRN

## 2022-08-07 ENCOUNTER — Other Ambulatory Visit: Payer: Self-pay | Admitting: Orthopaedic Surgery

## 2022-08-07 NOTE — Telephone Encounter (Signed)
Time to stop robaxin

## 2022-12-19 ENCOUNTER — Other Ambulatory Visit: Payer: Self-pay

## 2022-12-19 ENCOUNTER — Ambulatory Visit (INDEPENDENT_AMBULATORY_CARE_PROVIDER_SITE_OTHER): Payer: Medicare Other | Admitting: Orthopaedic Surgery

## 2022-12-19 ENCOUNTER — Encounter: Payer: Self-pay | Admitting: Orthopaedic Surgery

## 2022-12-19 VITALS — BP 142/76 | HR 47 | Ht 72.0 in | Wt 168.0 lb

## 2022-12-19 DIAGNOSIS — M533 Sacrococcygeal disorders, not elsewhere classified: Secondary | ICD-10-CM

## 2022-12-19 DIAGNOSIS — M25551 Pain in right hip: Secondary | ICD-10-CM

## 2022-12-19 MED ORDER — LIDOCAINE HCL 1 % IJ SOLN
1.0000 mL | INTRAMUSCULAR | Status: AC | PRN
Start: 2022-12-19 — End: 2022-12-19
  Administered 2022-12-19: 1 mL

## 2022-12-19 MED ORDER — BUPIVACAINE HCL 0.25 % IJ SOLN
1.0000 mL | INTRAMUSCULAR | Status: AC | PRN
Start: 2022-12-19 — End: 2022-12-19
  Administered 2022-12-19: 1 mL via INTRA_ARTICULAR

## 2022-12-19 MED ORDER — METHYLPREDNISOLONE ACETATE 40 MG/ML IJ SUSP
40.0000 mg | INTRAMUSCULAR | Status: AC | PRN
Start: 2022-12-19 — End: 2022-12-19
  Administered 2022-12-19: 40 mg via INTRA_ARTICULAR
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
## 2023-02-03 ENCOUNTER — Inpatient Hospital Stay (HOSPITAL_COMMUNITY): Payer: PPO

## 2023-02-03 ENCOUNTER — Inpatient Hospital Stay (HOSPITAL_COMMUNITY)
Admission: EM | Admit: 2023-02-03 | Discharge: 2023-02-06 | DRG: 321 | Disposition: A | Discharge status: Home / Self Care | Payer: PPO | Attending: Internal Medicine | Admitting: Internal Medicine

## 2023-02-03 ENCOUNTER — Inpatient Hospital Stay (HOSPITAL_COMMUNITY)
Admission: EM | Disposition: A | Discharge status: Home / Self Care | Payer: Self-pay | Source: Home / Self Care | Attending: Internal Medicine

## 2023-02-03 ENCOUNTER — Emergency Department (HOSPITAL_COMMUNITY): Payer: PPO

## 2023-02-03 DIAGNOSIS — Y840 Cardiac catheterization as the cause of abnormal reaction of the patient, or of later complication, without mention of misadventure at the time of the procedure: Secondary | ICD-10-CM | POA: Diagnosis not present

## 2023-02-03 DIAGNOSIS — T82818A Embolism of vascular prosthetic devices, implants and grafts, initial encounter: Secondary | ICD-10-CM | POA: Diagnosis not present

## 2023-02-03 DIAGNOSIS — I2102 ST elevation (STEMI) myocardial infarction involving left anterior descending coronary artery: Secondary | ICD-10-CM | POA: Diagnosis not present

## 2023-02-03 DIAGNOSIS — I2510 Atherosclerotic heart disease of native coronary artery without angina pectoris: Secondary | ICD-10-CM | POA: Diagnosis present

## 2023-02-03 DIAGNOSIS — Z96651 Presence of right artificial knee joint: Secondary | ICD-10-CM | POA: Diagnosis present

## 2023-02-03 DIAGNOSIS — E876 Hypokalemia: Secondary | ICD-10-CM | POA: Diagnosis present

## 2023-02-03 DIAGNOSIS — E785 Hyperlipidemia, unspecified: Secondary | ICD-10-CM | POA: Diagnosis present

## 2023-02-03 DIAGNOSIS — Z9841 Cataract extraction status, right eye: Secondary | ICD-10-CM

## 2023-02-03 DIAGNOSIS — M1711 Unilateral primary osteoarthritis, right knee: Secondary | ICD-10-CM | POA: Diagnosis present

## 2023-02-03 DIAGNOSIS — Z9889 Other specified postprocedural states: Secondary | ICD-10-CM

## 2023-02-03 DIAGNOSIS — Z8673 Personal history of transient ischemic attack (TIA), and cerebral infarction without residual deficits: Secondary | ICD-10-CM

## 2023-02-03 DIAGNOSIS — Y712 Prosthetic and other implants, materials and accessory cardiovascular devices associated with adverse incidents: Secondary | ICD-10-CM | POA: Diagnosis not present

## 2023-02-03 DIAGNOSIS — Z86718 Personal history of other venous thrombosis and embolism: Secondary | ICD-10-CM

## 2023-02-03 DIAGNOSIS — I255 Ischemic cardiomyopathy: Secondary | ICD-10-CM | POA: Diagnosis not present

## 2023-02-03 DIAGNOSIS — T8111XA Postprocedural  cardiogenic shock, initial encounter: Secondary | ICD-10-CM | POA: Diagnosis not present

## 2023-02-03 DIAGNOSIS — Z823 Family history of stroke: Secondary | ICD-10-CM

## 2023-02-03 DIAGNOSIS — Z9842 Cataract extraction status, left eye: Secondary | ICD-10-CM

## 2023-02-03 DIAGNOSIS — I213 ST elevation (STEMI) myocardial infarction of unspecified site: Secondary | ICD-10-CM | POA: Diagnosis present

## 2023-02-03 DIAGNOSIS — F0280 Dementia in other diseases classified elsewhere without behavioral disturbance: Secondary | ICD-10-CM | POA: Diagnosis present

## 2023-02-03 DIAGNOSIS — Z87891 Personal history of nicotine dependence: Secondary | ICD-10-CM

## 2023-02-03 DIAGNOSIS — D72829 Elevated white blood cell count, unspecified: Secondary | ICD-10-CM | POA: Diagnosis present

## 2023-02-03 DIAGNOSIS — Y92238 Other place in hospital as the place of occurrence of the external cause: Secondary | ICD-10-CM | POA: Diagnosis not present

## 2023-02-03 DIAGNOSIS — Z9049 Acquired absence of other specified parts of digestive tract: Secondary | ICD-10-CM

## 2023-02-03 DIAGNOSIS — I9789 Other postprocedural complications and disorders of the circulatory system, not elsewhere classified: Secondary | ICD-10-CM | POA: Diagnosis not present

## 2023-02-03 DIAGNOSIS — Z79899 Other long term (current) drug therapy: Secondary | ICD-10-CM

## 2023-02-03 DIAGNOSIS — I472 Ventricular tachycardia, unspecified: Secondary | ICD-10-CM | POA: Diagnosis not present

## 2023-02-03 DIAGNOSIS — K219 Gastro-esophageal reflux disease without esophagitis: Secondary | ICD-10-CM | POA: Diagnosis present

## 2023-02-03 DIAGNOSIS — Y838 Other surgical procedures as the cause of abnormal reaction of the patient, or of later complication, without mention of misadventure at the time of the procedure: Secondary | ICD-10-CM | POA: Diagnosis not present

## 2023-02-03 DIAGNOSIS — R9431 Abnormal electrocardiogram [ECG] [EKG]: Secondary | ICD-10-CM | POA: Diagnosis not present

## 2023-02-03 DIAGNOSIS — I5021 Acute systolic (congestive) heart failure: Secondary | ICD-10-CM | POA: Diagnosis not present

## 2023-02-03 DIAGNOSIS — G4730 Sleep apnea, unspecified: Secondary | ICD-10-CM | POA: Diagnosis present

## 2023-02-03 DIAGNOSIS — Z833 Family history of diabetes mellitus: Secondary | ICD-10-CM

## 2023-02-03 DIAGNOSIS — G300 Alzheimer's disease with early onset: Secondary | ICD-10-CM | POA: Diagnosis present

## 2023-02-03 DIAGNOSIS — I2109 ST elevation (STEMI) myocardial infarction involving other coronary artery of anterior wall: Secondary | ICD-10-CM | POA: Diagnosis present

## 2023-02-03 DIAGNOSIS — E872 Acidosis, unspecified: Secondary | ICD-10-CM | POA: Diagnosis not present

## 2023-02-03 DIAGNOSIS — Z955 Presence of coronary angioplasty implant and graft: Secondary | ICD-10-CM

## 2023-02-03 DIAGNOSIS — Z85828 Personal history of other malignant neoplasm of skin: Secondary | ICD-10-CM

## 2023-02-03 DIAGNOSIS — T81718A Complication of other artery following a procedure, not elsewhere classified, initial encounter: Secondary | ICD-10-CM | POA: Diagnosis not present

## 2023-02-03 DIAGNOSIS — Z8249 Family history of ischemic heart disease and other diseases of the circulatory system: Secondary | ICD-10-CM

## 2023-02-03 DIAGNOSIS — Z7982 Long term (current) use of aspirin: Secondary | ICD-10-CM

## 2023-02-03 DIAGNOSIS — Z8619 Personal history of other infectious and parasitic diseases: Secondary | ICD-10-CM

## 2023-02-03 DIAGNOSIS — I110 Hypertensive heart disease with heart failure: Secondary | ICD-10-CM | POA: Diagnosis present

## 2023-02-03 DIAGNOSIS — Z7902 Long term (current) use of antithrombotics/antiplatelets: Secondary | ICD-10-CM

## 2023-02-03 DIAGNOSIS — I493 Ventricular premature depolarization: Secondary | ICD-10-CM | POA: Diagnosis not present

## 2023-02-03 HISTORY — PX: CORONARY/GRAFT ACUTE MI REVASCULARIZATION: CATH118305

## 2023-02-03 LAB — CBC WITH DIFFERENTIAL/PLATELET
Abs Immature Granulocytes: 0.18 10*3/uL — ABNORMAL HIGH (ref 0.00–0.07)
Basophils Absolute: 0.1 10*3/uL (ref 0.0–0.1)
Basophils Relative: 0 %
Eosinophils Absolute: 0.1 10*3/uL (ref 0.0–0.5)
Eosinophils Relative: 0 %
HCT: 37.3 % — ABNORMAL LOW (ref 39.0–52.0)
Hemoglobin: 13.4 g/dL (ref 13.0–17.0)
Immature Granulocytes: 2 %
Lymphocytes Relative: 18 %
Lymphs Abs: 2.1 10*3/uL (ref 0.7–4.0)
MCH: 33.2 pg (ref 26.0–34.0)
MCHC: 35.9 g/dL (ref 30.0–36.0)
MCV: 92.3 fL (ref 80.0–100.0)
Monocytes Absolute: 0.4 10*3/uL (ref 0.1–1.0)
Monocytes Relative: 4 %
Neutro Abs: 9 10*3/uL — ABNORMAL HIGH (ref 1.7–7.7)
Neutrophils Relative %: 76 %
Platelets: 134 10*3/uL — ABNORMAL LOW (ref 150–400)
RBC: 4.04 MIL/uL — ABNORMAL LOW (ref 4.22–5.81)
RDW: 11.9 % (ref 11.5–15.5)
WBC: 11.8 10*3/uL — ABNORMAL HIGH (ref 4.0–10.5)
nRBC: 0 % (ref 0.0–0.2)

## 2023-02-03 LAB — COMPREHENSIVE METABOLIC PANEL
ALT: 17 U/L (ref 0–44)
AST: 24 U/L (ref 15–41)
Albumin: 3.2 g/dL — ABNORMAL LOW (ref 3.5–5.0)
Alkaline Phosphatase: 48 U/L (ref 38–126)
Anion gap: 14 (ref 5–15)
BUN: 14 mg/dL (ref 8–23)
CO2: 19 mmol/L — ABNORMAL LOW (ref 22–32)
Calcium: 8 mg/dL — ABNORMAL LOW (ref 8.9–10.3)
Chloride: 108 mmol/L (ref 98–111)
Creatinine, Ser: 1.07 mg/dL (ref 0.61–1.24)
GFR, Estimated: >60 mL/min (ref >60)
Glucose, Bld: 131 mg/dL — ABNORMAL HIGH (ref 70–99)
Potassium: 3.2 mmol/L — ABNORMAL LOW (ref 3.5–5.1)
Sodium: 141 mmol/L (ref 135–145)
Total Bilirubin: 1.3 mg/dL — ABNORMAL HIGH (ref 0.0–1.2)
Total Protein: 6 g/dL — ABNORMAL LOW (ref 6.5–8.1)

## 2023-02-03 LAB — POCT ACTIVATED CLOTTING TIME: Activated Clotting Time: 141 s

## 2023-02-03 LAB — TROPONIN I (HIGH SENSITIVITY)
Troponin I (High Sensitivity): 133 ng/L (ref <18)
Troponin I (High Sensitivity): 17036 ng/L (ref <18)

## 2023-02-03 LAB — LIPID PANEL
Cholesterol: 139 mg/dL (ref 0–200)
HDL: 40 mg/dL — ABNORMAL LOW (ref >40)
LDL Cholesterol: 88 mg/dL (ref 0–99)
Total CHOL/HDL Ratio: 3.5 {ratio}
Triglycerides: 53 mg/dL (ref <150)
VLDL: 11 mg/dL (ref 0–40)

## 2023-02-03 LAB — BASIC METABOLIC PANEL
Anion gap: 10 (ref 5–15)
BUN: 14 mg/dL (ref 8–23)
CO2: 23 mmol/L (ref 22–32)
Calcium: 8.9 mg/dL (ref 8.9–10.3)
Chloride: 105 mmol/L (ref 98–111)
Creatinine, Ser: 1.13 mg/dL (ref 0.61–1.24)
GFR, Estimated: >60 mL/min (ref >60)
Glucose, Bld: 154 mg/dL — ABNORMAL HIGH (ref 70–99)
Potassium: 3.9 mmol/L (ref 3.5–5.1)
Sodium: 138 mmol/L (ref 135–145)

## 2023-02-03 LAB — HEMOGLOBIN A1C
Hgb A1c MFr Bld: 5.3 % (ref 4.8–5.6)
Mean Plasma Glucose: 105.41 mg/dL

## 2023-02-03 LAB — PROTIME-INR
INR: 1.5 — ABNORMAL HIGH (ref 0.8–1.2)
Prothrombin Time: 18.6 s — ABNORMAL HIGH (ref 11.4–15.2)

## 2023-02-03 LAB — MRSA NEXT GEN BY PCR, NASAL: MRSA by PCR Next Gen: NOT DETECTED

## 2023-02-03 LAB — APTT
aPTT: >200 s (ref 24–36)
aPTT: 52 s — ABNORMAL HIGH (ref 24–36)

## 2023-02-03 SURGERY — CORONARY/GRAFT ACUTE MI REVASCULARIZATION
Anesthesia: LOCAL

## 2023-02-03 MED ORDER — HEPARIN SODIUM (PORCINE) 5000 UNIT/ML IJ SOLN: 4000.0000 [IU] | Freq: Once | INTRAMUSCULAR | Status: DC

## 2023-02-03 MED ORDER — TICAGRELOR 90 MG PO TABS
90.0000 mg | ORAL_TABLET | Freq: Two times a day (BID) | ORAL | Status: DC
  Administered 2023-02-03 – 2023-02-06 (×6): 90 mg via ORAL
  Filled 2023-02-03 (×6): qty 1

## 2023-02-03 MED ORDER — SODIUM CHLORIDE 0.9% FLUSH
3.0000 mL | Freq: Two times a day (BID) | INTRAVENOUS | Status: DC
  Administered 2023-02-03: 10 mL via INTRAVENOUS
  Administered 2023-02-04 (×2): 3 mL via INTRAVENOUS

## 2023-02-03 MED ORDER — NITROGLYCERIN 1 MG/10 ML FOR IR/CATH LAB
INTRA_ARTERIAL | Status: AC
  Filled 2023-02-03: qty 10

## 2023-02-03 MED ORDER — FENTANYL CITRATE (PF) 100 MCG/2ML IJ SOLN
INTRAMUSCULAR | Status: AC
  Filled 2023-02-03: qty 2

## 2023-02-03 MED ORDER — ACETAMINOPHEN 325 MG PO TABS: 650.0000 mg | ORAL_TABLET | ORAL | Status: DC | PRN

## 2023-02-03 MED ORDER — LABETALOL HCL 5 MG/ML IV SOLN: 10.0000 mg | INTRAVENOUS | Status: AC | PRN

## 2023-02-03 MED ORDER — ORAL CARE MOUTH RINSE: 15.0000 mL | OROMUCOSAL | Status: DC | PRN

## 2023-02-03 MED ORDER — AMIODARONE HCL IN DEXTROSE 360-4.14 MG/200ML-% IV SOLN
30.0000 mg/h | INTRAVENOUS | Status: DC
  Administered 2023-02-04 (×2): 30 mg/h via INTRAVENOUS
  Filled 2023-02-03 (×3): qty 200

## 2023-02-03 MED ORDER — ATORVASTATIN CALCIUM 80 MG PO TABS
80.0000 mg | ORAL_TABLET | Freq: Every day | ORAL | Status: DC
  Administered 2023-02-04 – 2023-02-06 (×3): 80 mg via ORAL
  Filled 2023-02-03 (×3): qty 1

## 2023-02-03 MED ORDER — FENTANYL CITRATE (PF) 100 MCG/2ML IJ SOLN
INTRAMUSCULAR | Status: DC | PRN
  Administered 2023-02-03: 25 ug via INTRAVENOUS

## 2023-02-03 MED ORDER — TICAGRELOR 90 MG PO TABS
ORAL_TABLET | ORAL | Status: AC
  Filled 2023-02-03: qty 2

## 2023-02-03 MED ORDER — HEPARIN SODIUM (PORCINE) 5000 UNIT/ML IJ SOLN: 60.0000 [IU]/kg | Freq: Once | INTRAMUSCULAR | Status: DC

## 2023-02-03 MED ORDER — VERAPAMIL HCL 2.5 MG/ML IV SOLN
INTRAVENOUS | Status: AC
  Filled 2023-02-03: qty 2

## 2023-02-03 MED ORDER — SODIUM CHLORIDE 0.9% FLUSH
3.0000 mL | Freq: Two times a day (BID) | INTRAVENOUS | Status: DC
  Administered 2023-02-03 – 2023-02-05 (×4): 3 mL via INTRAVENOUS

## 2023-02-03 MED ORDER — SODIUM CHLORIDE 0.9 % IV SOLN
250.0000 mL | INTRAVENOUS | Status: AC | PRN
Start: 2023-02-03 — End: 2023-02-04

## 2023-02-03 MED ORDER — HEPARIN (PORCINE) IN NACL 1000-0.9 UT/500ML-% IV SOLN
INTRAVENOUS | Status: DC | PRN
  Administered 2023-02-03 (×2): 500 mL

## 2023-02-03 MED ORDER — ASPIRIN 81 MG PO CHEW: 324.0000 mg | CHEWABLE_TABLET | Freq: Once | ORAL | Status: DC

## 2023-02-03 MED ORDER — VERAPAMIL HCL 2.5 MG/ML IV SOLN
INTRAVENOUS | Status: DC | PRN
  Administered 2023-02-03: 200 ug via INTRACORONARY

## 2023-02-03 MED ORDER — SODIUM CHLORIDE 0.9% FLUSH: 3.0000 mL | INTRAVENOUS | Status: DC | PRN

## 2023-02-03 MED ORDER — LIDOCAINE HCL (PF) 1 % IJ SOLN
INTRAMUSCULAR | Status: DC | PRN
  Administered 2023-02-03: 10 mL via INTRADERMAL
  Administered 2023-02-03: 2 mL via INTRADERMAL

## 2023-02-03 MED ORDER — FUROSEMIDE 10 MG/ML IJ SOLN
INTRAMUSCULAR | Status: AC
  Filled 2023-02-03: qty 4

## 2023-02-03 MED ORDER — MIDAZOLAM HCL 2 MG/2ML IJ SOLN
INTRAMUSCULAR | Status: AC
Start: 2023-02-03 — End: ?
  Filled 2023-02-03: qty 2

## 2023-02-03 MED ORDER — HEPARIN SODIUM (PORCINE) 1000 UNIT/ML IJ SOLN
INTRAMUSCULAR | Status: DC | PRN
  Administered 2023-02-03: 5000 [IU] via INTRAVENOUS
  Administered 2023-02-03: 1000 [IU] via INTRAVENOUS
  Administered 2023-02-03: 3000 [IU] via INTRAVENOUS

## 2023-02-03 MED ORDER — ONDANSETRON HCL 4 MG/2ML IJ SOLN: 4.0000 mg | Freq: Four times a day (QID) | INTRAMUSCULAR | Status: DC | PRN

## 2023-02-03 MED ORDER — HYDRALAZINE HCL 20 MG/ML IJ SOLN: 10.0000 mg | INTRAMUSCULAR | Status: AC | PRN

## 2023-02-03 MED ORDER — AMIODARONE HCL IN DEXTROSE 360-4.14 MG/200ML-% IV SOLN
60.0000 mg/h | INTRAVENOUS | Status: AC
  Administered 2023-02-03: 60 mg/h via INTRAVENOUS
  Filled 2023-02-03: qty 200

## 2023-02-03 MED ORDER — AMIODARONE HCL IN DEXTROSE 360-4.14 MG/200ML-% IV SOLN
INTRAVENOUS | Status: AC | PRN
  Administered 2023-02-03: 60 mg/h via INTRAVENOUS

## 2023-02-03 MED ORDER — HEPARIN SODIUM (PORCINE) 1000 UNIT/ML IJ SOLN
INTRAMUSCULAR | Status: AC
  Filled 2023-02-03: qty 10

## 2023-02-03 MED ORDER — AMIODARONE HCL 150 MG/3ML IV SOLN
INTRAVENOUS | Status: AC
  Filled 2023-02-03: qty 6

## 2023-02-03 MED ORDER — TICAGRELOR 90 MG PO TABS
ORAL_TABLET | ORAL | Status: DC | PRN
  Administered 2023-02-03: 180 mg via ORAL

## 2023-02-03 MED ORDER — MIDAZOLAM HCL 2 MG/2ML IJ SOLN
INTRAMUSCULAR | Status: DC | PRN
  Administered 2023-02-03: 1 mg via INTRAVENOUS

## 2023-02-03 MED ORDER — ASPIRIN 81 MG PO CHEW
81.0000 mg | CHEWABLE_TABLET | Freq: Every day | ORAL | Status: DC
  Administered 2023-02-04: 81 mg via ORAL
  Filled 2023-02-03: qty 1

## 2023-02-03 MED ORDER — AMIODARONE LOAD VIA INFUSION
INTRAVENOUS | Status: DC | PRN
  Administered 2023-02-03: 300 mg via INTRAVENOUS

## 2023-02-03 MED ORDER — METOPROLOL SUCCINATE ER 25 MG PO TB24
25.0000 mg | ORAL_TABLET | Freq: Every day | ORAL | Status: DC
  Administered 2023-02-03 – 2023-02-04 (×2): 25 mg via ORAL
  Filled 2023-02-03 (×2): qty 1

## 2023-02-03 MED ORDER — LIDOCAINE HCL (PF) 1 % IJ SOLN
INTRAMUSCULAR | Status: AC
  Filled 2023-02-03: qty 30

## 2023-02-03 MED ORDER — VERAPAMIL HCL 2.5 MG/ML IV SOLN
INTRAVENOUS | Status: DC | PRN
  Administered 2023-02-03: 10 mL via INTRA_ARTERIAL

## 2023-02-03 SURGICAL SUPPLY — 26 items
BALLN EMERGE MR 2.5X12 (BALLOONS) ×1
BALLN ~~LOC~~ EMERGE MR 2.5X12 (BALLOONS) ×1
BALLN ~~LOC~~ EMERGE MR 3.25X15 (BALLOONS) ×1
BALLOON EMERGE MR 2.5X12 (BALLOONS) IMPLANT
BALLOON ~~LOC~~ EMERGE MR 2.5X12 (BALLOONS) IMPLANT
BALLOON ~~LOC~~ EMERGE MR 3.25X15 (BALLOONS) IMPLANT
CATH DIAG 6FR PIGTAIL ANGLED (CATHETERS) IMPLANT
CATH INFINITI JR4 5F (CATHETERS) IMPLANT
CATH VISTA GUIDE 6FR XBLD 3.5 (CATHETERS) IMPLANT
DEVICE RAD COMP TR BAND LRG (VASCULAR PRODUCTS) IMPLANT
GLIDESHEATH SLEND SS 6F .021 (SHEATH) IMPLANT
GUIDEWIRE VAS SION BLUE 190 (WIRE) IMPLANT
KIT ENCORE 26 ADVANTAGE (KITS) IMPLANT
KIT HEMO VALVE WATCHDOG (MISCELLANEOUS) IMPLANT
KIT MICROPUNCTURE NIT STIFF (SHEATH) IMPLANT
KIT SINGLE USE MANIFOLD (KITS) IMPLANT
PACK CARDIAC CATHETERIZATION (CUSTOM PROCEDURE TRAY) ×2 IMPLANT
SET ATX-X65L (MISCELLANEOUS) IMPLANT
SHEATH PINNACLE 5F 10CM (SHEATH) IMPLANT
SHEATH PROBE COVER 6X72 (BAG) IMPLANT
STENT SYNERGY XD 2.50X16 (Permanent Stent) IMPLANT
STENT SYNERGY XD 3.0X28 (Permanent Stent) IMPLANT
SYNERGY XD 2.50X16 (Permanent Stent) ×1 IMPLANT
SYNERGY XD 3.0X28 (Permanent Stent) ×1 IMPLANT
WIRE EMERALD 3MM-J .035X260CM (WIRE) IMPLANT
WIRE MICRO SET SILHO 5FR 7 (SHEATH) IMPLANT

## 2023-02-03 NOTE — ED Triage Notes (Signed)
PT BIB [facility] as CODE STEMI. PT went of 9.5 mile run and chest pain started after the run about 1230. Pt given 324 ASA, 1 nitroglycerin, and 4mg  Morphine en route via EMS.

## 2023-02-03 NOTE — ED Provider Notes (Signed)
[facility] AT [facility] Provider Note   CSN: [id number] Arrival date & time: 02/03/23  1357     History {Add pertinent medical, surgical, social history, OB history to HPI:1} No chief complaint on file.   Brendan Holland is a 73 y.o. male.  HPI   73 year old male no history of significant medical problems, states he is not treated for hypertension cholesterol diabetes and does not smoke in fact he went on a 9-1/2 mile run just prior to this occurring.  At the end of the run he started to have significant chest discomfort, paramedics were called and found the patient to be diaphoretic, prehospital EKG showed STEMI pattern with ST elevations, cardiology at the bedside shortly after arrival to evaluate.  Patient is having active chest discomfort, no shortness of breath, fevers or chills or recent illnesses  Home Medications Prior to Admission medications   Medication Sig Start Date End Date Taking? Authorizing Provider  aspirin EC 81 MG tablet Take 81 mg by mouth daily. Swallow whole.    [provider]  donepezil (ARICEPT) 10 MG tablet TAKE 1 TABLET BY MOUTH DAILY Patient taking differently: Take 10 mg by mouth at bedtime. 11/16/14   Drema Dallas, DO  HYDROcodone-acetaminophen (NORCO) 5-325 MG tablet Take 1 tablet by mouth every 6 (six) hours as needed for moderate pain. Patient not taking: Reported on 10/04/2021 02/03/21   Eldred Manges, MD  methocarbamol (ROBAXIN) 500 MG tablet Take 1 tablet (500 mg total) by mouth daily as needed for muscle spasms. Patient not taking: Reported on 12/19/2022 06/07/22   Eldred Manges, MD  Misc Natural Products (GLUCOSAMINE CHOND MSM FORMULA PO) Take 1 tablet by mouth daily.    [provider]  Multiple Vitamins-Minerals (MULTIVITAMIN WITH MINERALS) tablet Take 1 tablet by mouth daily.    [provider]  Omega-3 Fatty Acids (FISH OIL) 1000 MG CAPS Take 1,000 mg by mouth daily.    [provider]  omeprazole (PRILOSEC) 40 MG capsule Take 40 mg by mouth daily. 07/23/19   [provider]  predniSONE (STERAPRED UNI-PAK 21 TAB) 10 MG (21) TBPK tablet Take 6,5,4,3,2,1 one tablet less each day by mouth with food. 10/04/21   Eldred Manges, MD  simvastatin (ZOCOR) 10 MG tablet Take 10 mg by mouth daily.  10/11/15   [provider]  traZODone (DESYREL) 50 MG tablet Take 50 mg by mouth at bedtime. 01/17/21   [provider]      Allergies    Patient has no known allergies.    Review of Systems   Review of Systems  All other systems reviewed and are negative.   Physical Exam Updated Vital Signs There were no vitals taken for this visit. Physical Exam Vitals and nursing note reviewed.  Constitutional:      General: He is not in acute distress.    Appearance: He is well-developed. He is ill-appearing.  HENT:     Head: Normocephalic and atraumatic.     Mouth/Throat:     Pharynx: No oropharyngeal exudate.  Eyes:     General: No scleral icterus.       Right eye: No discharge.        Left eye: No discharge.     Conjunctiva/sclera: Conjunctivae normal.     Pupils: Pupils are equal, round, and reactive to light.  Neck:     Thyroid: No thyromegaly.     Vascular: No JVD.  Cardiovascular:  Rate and Rhythm: Normal rate and regular rhythm.     Heart sounds: Normal heart sounds. No murmur heard.    No friction rub. No gallop.  Pulmonary:     Effort: Pulmonary effort is normal. No respiratory distress.     Breath sounds: Normal breath sounds. No wheezing or rales.  Abdominal:     General: Bowel sounds are normal. There is no distension.     Palpations: Abdomen is soft. There is no mass.     Tenderness: There is no abdominal tenderness.  Musculoskeletal:        General: No tenderness. Normal range of motion.     Cervical back: Normal range of motion and neck supple.     Right lower leg: No edema.     Left lower leg: No edema.  Lymphadenopathy:      Cervical: No cervical adenopathy.  Skin:    General: Skin is warm and dry.     Findings: No erythema or rash.  Neurological:     Mental Status: He is alert.     Coordination: Coordination normal.  Psychiatric:        Behavior: Behavior normal.     ED Results / Procedures / Treatments   Labs (all labs ordered are listed, but only abnormal results are displayed) Labs Reviewed  HEMOGLOBIN A1C  CBC WITH DIFFERENTIAL/PLATELET  PROTIME-INR  APTT  COMPREHENSIVE METABOLIC PANEL  LIPID PANEL  I-STAT CG4 LACTIC ACID, ED  TROPONIN I (HIGH SENSITIVITY)    EKG None  Radiology No results found.  Procedures Procedures  {Document cardiac monitor, telemetry assessment procedure when appropriate:1}  Medications Ordered in ED Medications  aspirin chewable tablet 324 mg (has no administration in time range)  heparin injection 60 Units/kg (has no administration in time range)  sodium chloride flush (NS) 0.9 % injection 3-10 mL (has no administration in time range)  sodium chloride flush (NS) 0.9 % injection 3-10 mL (has no administration in time range)    ED Course/ Medical Decision Making/ A&P   {   Click here for ABCD2, HEART and other calculatorsREFRESH Note before signing :1}                              Medical Decision Making Amount and/or Complexity of Data Reviewed Labs: ordered. Radiology: ordered.  Risk OTC drugs. Prescription drug management.    This patient presents to the ED for concern of chest discomfort, this involves an extensive number of treatment options, and is a complaint that carries with it a high risk of complications and morbidity.  The differential diagnosis includes acute STEMI, blockage, dissection, PE   Co morbidities that complicate the patient evaluation  None   Additional history obtained:  Additional history obtained from medical record External records from outside source obtained and reviewed including treated for Alzheimer's,  adjustment disorder   Lab Tests:  I Ordered, and personally interpreted labs.  The pertinent results include:  ***   Imaging Studies ordered:  I ordered imaging studies including ***  I independently visualized and interpreted imaging which showed *** I agree with the radiologist interpretation   Cardiac Monitoring: / EKG:  The patient was maintained on a cardiac monitor.  I personally viewed and interpreted the cardiac monitored which showed an underlying rhythm of: ***   Consultations Obtained:  I requested consultation with the ***,  and discussed lab and imaging findings as well as pertinent plan - they  recommend: ***   Problem List / ED Course / Critical interventions / Medication management  *** I ordered medication including ***  for ***  Reevaluation of the patient after these medicines showed that the patient {resolved/improved/worsened:23923::"improved"} I have reviewed the patients home medicines and have made adjustments as needed   Social Determinants of Health:  ***   Test / Admission - Considered:  ***   {Document critical care time when appropriate:1} {Document review of labs and clinical decision tools ie heart score, Chads2Vasc2 etc:1}  {Document your independent review of radiology images, and any outside records:1} {Document your discussion with family members, caretakers, and with consultants:1} {Document social determinants of health affecting pt's care:1} {Document your decision making why or why not admission, treatments were needed:1} Final Clinical Impression(s) / ED Diagnoses Final diagnoses:  None    Rx / DC Orders ED Discharge Orders     None
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
## 2023-02-03 NOTE — Progress Notes (Signed)
   02/03/23 1436  Spiritual Encounters  Type of Visit Attempt (pt unavailable)  Care provided to: Pt not available  Conversation partners present during encounter Nurse  Reason for visit Code  OnCall Visit Yes   Responded to code blue in Cath Lab bay 1

## 2023-02-04 ENCOUNTER — Inpatient Hospital Stay (HOSPITAL_COMMUNITY): Payer: PPO

## 2023-02-04 DIAGNOSIS — I2102 ST elevation (STEMI) myocardial infarction involving left anterior descending coronary artery: Secondary | ICD-10-CM | POA: Diagnosis not present

## 2023-02-04 LAB — CBC
HCT: 39.4 % (ref 39.0–52.0)
Hemoglobin: 14.3 g/dL (ref 13.0–17.0)
MCH: 32.9 pg (ref 26.0–34.0)
MCHC: 36.3 g/dL — ABNORMAL HIGH (ref 30.0–36.0)
MCV: 90.6 fL (ref 80.0–100.0)
Platelets: 154 10*3/uL (ref 150–400)
RBC: 4.35 MIL/uL (ref 4.22–5.81)
RDW: 12.2 % (ref 11.5–15.5)
WBC: 10.5 10*3/uL (ref 4.0–10.5)
nRBC: 0 % (ref 0.0–0.2)

## 2023-02-04 LAB — ECHOCARDIOGRAM COMPLETE
Area-P 1/2: 3.63 cm2
Calc EF: 44.2 %
S' Lateral: 4.3 cm
Single Plane A2C EF: 48.2 %
Single Plane A4C EF: 36.3 %

## 2023-02-04 LAB — BASIC METABOLIC PANEL
Anion gap: 10 (ref 5–15)
BUN: 12 mg/dL (ref 8–23)
CO2: 23 mmol/L (ref 22–32)
Calcium: 9 mg/dL (ref 8.9–10.3)
Chloride: 103 mmol/L (ref 98–111)
Creatinine, Ser: 1.08 mg/dL (ref 0.61–1.24)
GFR, Estimated: >60 mL/min (ref >60)
Glucose, Bld: 119 mg/dL — ABNORMAL HIGH (ref 70–99)
Potassium: 3.9 mmol/L (ref 3.5–5.1)
Sodium: 136 mmol/L (ref 135–145)

## 2023-02-04 MED ORDER — CHLORHEXIDINE GLUCONATE CLOTH 2 % EX PADS
6.0000 | MEDICATED_PAD | Freq: Every day | CUTANEOUS | Status: DC
  Administered 2023-02-04 – 2023-02-06 (×3): 6 via TOPICAL

## 2023-02-04 MED ORDER — POTASSIUM CHLORIDE CRYS ER 20 MEQ PO TBCR
20.0000 meq | EXTENDED_RELEASE_TABLET | Freq: Once | ORAL | Status: AC
  Administered 2023-02-04: 20 meq via ORAL
  Filled 2023-02-04: qty 1

## 2023-02-04 MED ORDER — PERFLUTREN LIPID MICROSPHERE
1.0000 mL | INTRAVENOUS | Status: AC | PRN
  Administered 2023-02-04: 3 mL via INTRAVENOUS

## 2023-02-04 MED ORDER — DONEPEZIL HCL 10 MG PO TABS
10.0000 mg | ORAL_TABLET | Freq: Every day | ORAL | Status: DC
  Administered 2023-02-04 – 2023-02-05 (×3): 10 mg via ORAL
  Filled 2023-02-04 (×3): qty 1

## 2023-02-04 MED ORDER — ASPIRIN 81 MG PO CHEW
81.0000 mg | CHEWABLE_TABLET | ORAL | Status: AC
  Administered 2023-02-05: 81 mg via ORAL
  Filled 2023-02-04: qty 1

## 2023-02-04 MED ORDER — ENOXAPARIN SODIUM 40 MG/0.4ML IJ SOSY
40.0000 mg | PREFILLED_SYRINGE | INTRAMUSCULAR | Status: DC
  Administered 2023-02-04 – 2023-02-06 (×3): 40 mg via SUBCUTANEOUS
  Filled 2023-02-04 (×2): qty 0.4

## 2023-02-04 MED ORDER — HYALURONIDASE HUMAN 150 UNIT/ML IJ SOLN
150.0000 [IU] | Freq: Once | INTRAMUSCULAR | Status: AC
  Administered 2023-02-04: 150 [IU] via SUBCUTANEOUS
  Filled 2023-02-04: qty 1

## 2023-02-04 MED ORDER — SODIUM CHLORIDE 0.9 % IV SOLN: INTRAVENOUS | Status: DC

## 2023-02-04 MED ORDER — ASPIRIN 81 MG PO CHEW: 81.0000 mg | CHEWABLE_TABLET | Freq: Every day | ORAL | Status: DC

## 2023-02-04 NOTE — Progress Notes (Signed)
Echocardiogram 2D Echocardiogram has been performed.  Warren Lacy Alicea Wente RDCS 02/04/2023, 9:19 AM

## 2023-02-04 NOTE — Plan of Care (Signed)
  Problem: Education: Goal: Knowledge of General Education information will improve Description: Including pain rating scale, medication(s)/side effects and non-pharmacologic comfort measures Outcome: Progressing   Problem: Activity: Goal: Risk for activity intolerance will decrease Outcome: Progressing   Problem: Coping: Goal: Level of anxiety will decrease Outcome: Progressing   Problem: Elimination: Goal: Will not experience complications related to urinary retention Outcome: Progressing   

## 2023-02-04 NOTE — Progress Notes (Signed)
Patient ID: Brendan Holland, male   DOB: [birth year], 73 y.o.   MRN: [id number]     Advanced Heart Failure Rounding Note  Cardiologist: None  Chief Complaint: chest pain Subjective:    No further chest pain overnight.  He had several short NSVT runs.  Remains on amiodarone 30 mg/hr.   I reviewed his echo today, EF 35-40% with mid-apical anteroseptal, apical anterior, apical inferior, and true apex akinesis with no LV thrombus, RV normal.     Objective:   Weight Range:   There is no height or weight on file to calculate BMI.   Vital Signs:   Temp:  [98.4 F (36.9 C)-98.9 F (37.2 C)] 98.4 F (36.9 C) (01/19 0747) Pulse Rate:  [52-71] 67 (01/19 0600) Resp:  [9-21] 15 (01/19 0600) BP: (90-142)/(58-84) 122/71 (01/19 0500) SpO2:  [92 %-100 %] 97 % (01/19 0600) Last BM Date : 02/03/23 (prior to admission)  Weight change: There were no vitals filed for this visit.  Intake/Output:   Intake/Output Summary (Last 24 hours) at 02/04/2023 0942 Last data filed at 02/04/2023 0700 Gross per 24 hour  Intake 994.04 ml  Output 1700 ml  Net -705.96 ml      Physical Exam    General:  Well appearing. No resp difficulty HEENT: Normal Neck: Supple. JVP 8 cm. Carotids 2+ bilat; no bruits. No lymphadenopathy or thyromegaly appreciated. Cor: PMI nondisplaced. Regular rate & rhythm. No rubs, gallops or murmurs. Lungs: Clear Abdomen: Soft, nontender, nondistended. No hepatosplenomegaly. No bruits or masses. Good bowel sounds. Extremities: No cyanosis, clubbing, rash, edema Neuro: Alert & orientedx3, cranial nerves grossly intact. moves all 4 extremities w/o difficulty. Affect pleasant   Telemetry   NSR 50s-60s with short NSVT runs (personally reviewed)  Labs    CBC Recent Labs    02/03/23 1440  WBC 11.8*  NEUTROABS 9.0*  HGB 13.4  HCT 37.3*  MCV 92.3  PLT 134*   Basic Metabolic Panel Recent Labs    40/98/11 1440 02/03/23 1956  NA 141 138  K 3.2* 3.9  CL 108 105  CO2 19*  23  GLUCOSE 131* 154*  BUN 14 14  CREATININE 1.07 1.13  CALCIUM 8.0* 8.9   Liver Function Tests Recent Labs    02/03/23 1440  AST 24  ALT 17  ALKPHOS 48  BILITOT 1.3*  PROT 6.0*  ALBUMIN 3.2*   No results for input(s): "LIPASE", "AMYLASE" in the last 72 hours. Cardiac Enzymes No results for input(s): "CKTOTAL", "CKMB", "CKMBINDEX", "TROPONINI" in the last 72 hours.  BNP: BNP (last 3 results) No results for input(s): "BNP" in the last 8760 hours.  ProBNP (last 3 results) No results for input(s): "PROBNP" in the last 8760 hours.   D-Dimer No results for input(s): "DDIMER" in the last 72 hours. Hemoglobin A1C Recent Labs    02/03/23 1440  HGBA1C 5.3   Fasting Lipid Panel Recent Labs    02/03/23 1440  CHOL 139  HDL 40*  LDLCALC 88  TRIG 53  CHOLHDL 3.5   Thyroid Function Tests No results for input(s): "TSH", "T4TOTAL", "T3FREE", "THYROIDAB" in the last 72 hours.  Invalid input(s): "FREET3"  Other results:   Imaging    CARDIAC CATHETERIZATION Result Date: 02/03/2023   Mid LAD lesion is 100% stenosed.   Dist RCA lesion is 70% stenosed.   2nd RPL lesion is 70% stenosed.   RPAV lesion is 70% stenosed.   1st Mrg lesion is 90% stenosed.   A stent was successfully  placed.   Post intervention, there is a 0% residual stenosis. 1.  100% mid LAD occlusion treated with 2 overlapping drug-eluting stents; the procedure was complicated by ventricular tachycardia requiring cardioversion x 2.  The patient will be treated with IV amiodarone infusion overnight. 2.  LVEDP of 30 mmHg; the patient received 10 mg of IV Lasix in the laboratory.  EF is approximately 40% on ventriculography with anterior lateral hypokinesis. 3.  High-grade residual disease of the distal right coronary artery and obtuse marginal branch; staged PCI on Monday will be pursued. Recommendation: IV amiodarone overnight, dual antiplatelet therapy for at least 1 year, and staged PCI on Monday.  DG Chest Port 1  View Result Date: 02/03/2023 CLINICAL DATA:  Chest pain EXAM: PORTABLE CHEST 1 VIEW COMPARISON:  12/20/2015 FINDINGS: The heart size and mediastinal contours are within normal limits. Aortic atherosclerosis. Diffusely coarsened interstitial markings bilaterally without focal airspace consolidation. No pleural effusion or pneumothorax. The visualized skeletal structures are unremarkable. IMPRESSION: Diffusely coarsened interstitial markings bilaterally without focal airspace consolidation. Findings may reflect bronchitic lung changes versus edema or atypical/viral infection. Electronically Signed   By: Duanne Guess D.O.   On: 02/03/2023 16:36     Medications:     Scheduled Medications:  aspirin  81 mg Oral Daily   atorvastatin  80 mg Oral Daily   Chlorhexidine Gluconate Cloth  6 each Topical Daily   donepezil  10 mg Oral QHS   heparin  4,000 Units Intravenous Once   metoprolol succinate  25 mg Oral QHS   sodium chloride flush  3 mL Intravenous Q12H   sodium chloride flush  3-10 mL Intravenous Q12H   ticagrelor  90 mg Oral BID    Infusions:  sodium chloride 10 mL/hr at 02/04/23 0700   amiodarone 30 mg/hr (02/04/23 0700)    PRN Medications: sodium chloride, acetaminophen, ondansetron (ZOFRAN) IV, mouth rinse, perflutren lipid microspheres (DEFINITY) IV suspension, sodium chloride flush, sodium chloride flush   Assessment/Plan   1. CAD: Anterior STEMI with cath showing occluded mid LAD, 90% stenosis OM1 branch, 70% dRCA.  DES to LAD yesterday.  No further chest pain.  - Plan for staged PCI OM and RCA Monday per Dr. Lynnette Caffey. Discussed risks/benefits with patient and he agrees to procedure.  - Continue ASA 81 + ticagrelor.  - Atorvastatin 80 daily.  2. Acute systolic CHF: Ischemic cardiomyopathy. I reviewed his echo today, EF 35-40% with mid-apical anteroseptal, apical anterior, apical inferior, and true apex akinesis with no LV thrombus, RV normal.  He does not look significantly  volume overloaded on exam.  - Continue Toprol XL 25 mg daily.  - Add spironolactone 12.5 daily.  - Eventual Entresto.  - Repeat echo at 3 months to assess for recovery.  3. Early onset dementia: Seems mild.  - On donepezil.  4. VT: Patient had VT during PCI on 1/18, amiodarone started.  He had a few short NSVT runs overnight.  - Will continue amiodarone gtt for now with NSVT this morning.  - Replace K.  Length of Stay: 1  Marca Ancona, MD  02/04/2023, 9:42 AM  Advanced Heart Failure Team Pager [phone] (M-F; 7a - 5p)  Please contact [facility] for night-coverage after hours (5p -7a ) and weekends on [email or web address]

## 2023-02-05 ENCOUNTER — Encounter (HOSPITAL_COMMUNITY)
Admission: EM | Disposition: A | Discharge status: Home / Self Care | Payer: Self-pay | Source: Home / Self Care | Attending: Internal Medicine

## 2023-02-05 ENCOUNTER — Other Ambulatory Visit (HOSPITAL_COMMUNITY): Payer: Self-pay

## 2023-02-05 ENCOUNTER — Encounter (HOSPITAL_COMMUNITY): Payer: Self-pay | Admitting: Internal Medicine

## 2023-02-05 ENCOUNTER — Telehealth (HOSPITAL_COMMUNITY): Payer: Self-pay | Admitting: Pharmacy Technician

## 2023-02-05 DIAGNOSIS — R9431 Abnormal electrocardiogram [ECG] [EKG]: Secondary | ICD-10-CM

## 2023-02-05 DIAGNOSIS — I2510 Atherosclerotic heart disease of native coronary artery without angina pectoris: Secondary | ICD-10-CM | POA: Diagnosis not present

## 2023-02-05 HISTORY — PX: CORONARY STENT INTERVENTION: CATH118234

## 2023-02-05 HISTORY — PX: LEFT HEART CATH AND CORONARY ANGIOGRAPHY: CATH118249

## 2023-02-05 LAB — POCT I-STAT 7, (LYTES, BLD GAS, ICA,H+H)
Acid-base deficit: 8 mmol/L — ABNORMAL HIGH (ref 0.0–2.0)
Bicarbonate: 19 mmol/L — ABNORMAL LOW (ref 20.0–28.0)
Calcium, Ion: 1.09 mmol/L — ABNORMAL LOW (ref 1.15–1.40)
HCT: 39 % (ref 39.0–52.0)
Hemoglobin: 13.3 g/dL (ref 13.0–17.0)
O2 Saturation: 96 %
Potassium: 3.3 mmol/L — ABNORMAL LOW (ref 3.5–5.1)
Sodium: 115 mmol/L — CL (ref 135–145)
TCO2: 20 mmol/L — ABNORMAL LOW (ref 22–32)
pCO2 arterial: 41.2 mm[Hg] (ref 32–48)
pH, Arterial: 7.271 — ABNORMAL LOW (ref 7.35–7.45)
pO2, Arterial: 94 mm[Hg] (ref 83–108)

## 2023-02-05 LAB — BASIC METABOLIC PANEL
Anion gap: 9 (ref 5–15)
Anion gap: 10 (ref 5–15)
BUN: 10 mg/dL (ref 8–23)
BUN: 9 mg/dL (ref 8–23)
CO2: 23 mmol/L (ref 22–32)
CO2: 23 mmol/L (ref 22–32)
Calcium: 8.7 mg/dL — ABNORMAL LOW (ref 8.9–10.3)
Calcium: 8.5 mg/dL — ABNORMAL LOW (ref 8.9–10.3)
Chloride: 104 mmol/L (ref 98–111)
Chloride: 102 mmol/L (ref 98–111)
Creatinine, Ser: 1.06 mg/dL (ref 0.61–1.24)
Creatinine, Ser: 1.15 mg/dL (ref 0.61–1.24)
GFR, Estimated: >60 mL/min (ref >60)
GFR, Estimated: >60 mL/min (ref >60)
Glucose, Bld: 114 mg/dL — ABNORMAL HIGH (ref 70–99)
Glucose, Bld: 120 mg/dL — ABNORMAL HIGH (ref 70–99)
Potassium: 3.8 mmol/L (ref 3.5–5.1)
Potassium: 3.8 mmol/L (ref 3.5–5.1)
Sodium: 136 mmol/L (ref 135–145)
Sodium: 135 mmol/L (ref 135–145)

## 2023-02-05 LAB — CBC
HCT: 38 % — ABNORMAL LOW (ref 39.0–52.0)
Hemoglobin: 13.9 g/dL (ref 13.0–17.0)
MCH: 33.9 pg (ref 26.0–34.0)
MCHC: 36.6 g/dL — ABNORMAL HIGH (ref 30.0–36.0)
MCV: 92.7 fL (ref 80.0–100.0)
Platelets: 131 10*3/uL — ABNORMAL LOW (ref 150–400)
RBC: 4.1 MIL/uL — ABNORMAL LOW (ref 4.22–5.81)
RDW: 12.4 % (ref 11.5–15.5)
WBC: 7.7 10*3/uL (ref 4.0–10.5)
nRBC: 0 % (ref 0.0–0.2)

## 2023-02-05 LAB — POCT ACTIVATED CLOTTING TIME
Activated Clotting Time: 193 s
Activated Clotting Time: 400 s
Activated Clotting Time: 193 s
Activated Clotting Time: 377 s
Activated Clotting Time: 562 s
Activated Clotting Time: 279 s
Activated Clotting Time: 176 s

## 2023-02-05 LAB — MAGNESIUM: Magnesium: 1.8 mg/dL (ref 1.7–2.4)

## 2023-02-05 LAB — CG4 I-STAT (LACTIC ACID): Lactic Acid, Venous: 1.5 mmol/L (ref 0.5–1.9)

## 2023-02-05 SURGERY — CORONARY STENT INTERVENTION
Anesthesia: LOCAL

## 2023-02-05 SURGERY — LEFT HEART CATH AND CORONARY ANGIOGRAPHY
Anesthesia: LOCAL

## 2023-02-05 MED ORDER — FENTANYL CITRATE (PF) 100 MCG/2ML IJ SOLN
INTRAMUSCULAR | Status: AC
  Filled 2023-02-05: qty 2

## 2023-02-05 MED ORDER — ASPIRIN 81 MG PO CHEW: 81.0000 mg | CHEWABLE_TABLET | Freq: Every day | ORAL | Status: DC

## 2023-02-05 MED ORDER — VERAPAMIL HCL 2.5 MG/ML IV SOLN
INTRAVENOUS | Status: AC
  Filled 2023-02-05: qty 2

## 2023-02-05 MED ORDER — IOHEXOL 350 MG/ML SOLN
INTRAVENOUS | Status: DC | PRN
  Administered 2023-02-05: 18 mL
  Administered 2023-02-05: 170 mL via INTRA_ARTERIAL

## 2023-02-05 MED ORDER — ATROPINE SULFATE 1 MG/10ML IJ SOSY
PREFILLED_SYRINGE | INTRAMUSCULAR | Status: DC | PRN
  Administered 2023-02-05 (×2): 1 mg via INTRAVENOUS

## 2023-02-05 MED ORDER — ASPIRIN 81 MG PO TBEC
81.0000 mg | DELAYED_RELEASE_TABLET | Freq: Every day | ORAL | Status: DC
  Administered 2023-02-06: 81 mg via ORAL
  Filled 2023-02-05: qty 1

## 2023-02-05 MED ORDER — SODIUM BICARBONATE 8.4 % IV SOLN
INTRAVENOUS | Status: AC
  Filled 2023-02-05: qty 100

## 2023-02-05 MED ORDER — ASPIRIN 81 MG PO CHEW: 81.0000 mg | CHEWABLE_TABLET | ORAL | Status: DC

## 2023-02-05 MED ORDER — HYDRALAZINE HCL 20 MG/ML IJ SOLN: 10.0000 mg | INTRAMUSCULAR | Status: DC | PRN

## 2023-02-05 MED ORDER — LIDOCAINE HCL (PF) 1 % IJ SOLN
INTRAMUSCULAR | Status: AC
Start: 2023-02-05 — End: ?
  Filled 2023-02-05: qty 30

## 2023-02-05 MED ORDER — SODIUM CHLORIDE 0.9% FLUSH: 3.0000 mL | INTRAVENOUS | Status: DC | PRN

## 2023-02-05 MED ORDER — SODIUM CHLORIDE 0.9 % IV SOLN: 250.0000 mL | INTRAVENOUS | Status: DC | PRN

## 2023-02-05 MED ORDER — HEPARIN SODIUM (PORCINE) 1000 UNIT/ML IJ SOLN
INTRAMUSCULAR | Status: AC
Start: 2023-02-05 — End: ?
  Filled 2023-02-05: qty 10

## 2023-02-05 MED ORDER — VERAPAMIL HCL 2.5 MG/ML IV SOLN
INTRAVENOUS | Status: AC
Start: 2023-02-05 — End: ?
  Filled 2023-02-05: qty 2

## 2023-02-05 MED ORDER — SODIUM CHLORIDE 0.9% FLUSH: 3.0000 mL | Freq: Two times a day (BID) | INTRAVENOUS | Status: DC

## 2023-02-05 MED ORDER — PHENYLEPHRINE 80 MCG/ML (10ML) SYRINGE FOR IV PUSH (FOR BLOOD PRESSURE SUPPORT)
PREFILLED_SYRINGE | INTRAVENOUS | Status: DC | PRN
  Administered 2023-02-05: 240 ug via INTRAVENOUS

## 2023-02-05 MED ORDER — VERAPAMIL HCL 2.5 MG/ML IV SOLN
INTRAVENOUS | Status: DC | PRN
  Administered 2023-02-05: 10 mL via INTRA_ARTERIAL

## 2023-02-05 MED ORDER — LIDOCAINE HCL (PF) 1 % IJ SOLN
INTRAMUSCULAR | Status: DC | PRN
  Administered 2023-02-05: 5 mL
  Administered 2023-02-05: 10 mL

## 2023-02-05 MED ORDER — MIDAZOLAM HCL 2 MG/2ML IJ SOLN
INTRAMUSCULAR | Status: AC
  Filled 2023-02-05: qty 2

## 2023-02-05 MED ORDER — HEPARIN SODIUM (PORCINE) 1000 UNIT/ML IJ SOLN
INTRAMUSCULAR | Status: DC | PRN
  Administered 2023-02-05: 3000 [IU] via INTRAVENOUS
  Administered 2023-02-05: 5000 [IU] via INTRA_ARTERIAL

## 2023-02-05 MED ORDER — NOREPINEPHRINE 4 MG/250ML-% IV SOLN
INTRAVENOUS | Status: AC
  Filled 2023-02-05: qty 250

## 2023-02-05 MED ORDER — METOPROLOL SUCCINATE ER 25 MG PO TB24
12.5000 mg | ORAL_TABLET | Freq: Every day | ORAL | Status: DC
  Administered 2023-02-05: 12.5 mg via ORAL
  Filled 2023-02-05: qty 1

## 2023-02-05 MED ORDER — MAGNESIUM SULFATE 2 GM/50ML IV SOLN
2.0000 g | Freq: Once | INTRAVENOUS | Status: AC
  Administered 2023-02-05: 2 g via INTRAVENOUS
  Filled 2023-02-05: qty 50

## 2023-02-05 MED ORDER — LABETALOL HCL 5 MG/ML IV SOLN: 10.0000 mg | INTRAVENOUS | Status: AC | PRN

## 2023-02-05 MED ORDER — TICAGRELOR 90 MG PO TABS: 90.0000 mg | ORAL_TABLET | Freq: Two times a day (BID) | ORAL | Status: DC

## 2023-02-05 MED ORDER — POTASSIUM CHLORIDE CRYS ER 20 MEQ PO TBCR
40.0000 meq | EXTENDED_RELEASE_TABLET | Freq: Once | ORAL | Status: AC
  Administered 2023-02-05: 40 meq via ORAL
  Filled 2023-02-05: qty 2

## 2023-02-05 MED ORDER — LABETALOL HCL 5 MG/ML IV SOLN: 10.0000 mg | INTRAVENOUS | Status: DC | PRN

## 2023-02-05 MED ORDER — HYDRALAZINE HCL 20 MG/ML IJ SOLN: 10.0000 mg | INTRAMUSCULAR | Status: AC | PRN

## 2023-02-05 MED ORDER — ALUM & MAG HYDROXIDE-SIMETH 200-200-20 MG/5ML PO SUSP
30.0000 mL | Freq: Four times a day (QID) | ORAL | Status: DC | PRN
  Administered 2023-02-05: 30 mL via ORAL
  Filled 2023-02-05: qty 30

## 2023-02-05 MED ORDER — SODIUM CHLORIDE 0.9 % IV SOLN: INTRAVENOUS | Status: DC

## 2023-02-05 MED ORDER — FENTANYL CITRATE (PF) 100 MCG/2ML IJ SOLN
INTRAMUSCULAR | Status: DC | PRN
  Administered 2023-02-05: 25 ug
  Administered 2023-02-05: 25 ug via INTRAVENOUS

## 2023-02-05 MED ORDER — ATROPINE SULFATE 1 MG/10ML IJ SOSY
PREFILLED_SYRINGE | INTRAMUSCULAR | Status: AC
Start: 2023-02-05 — End: ?
  Filled 2023-02-05: qty 20

## 2023-02-05 MED ORDER — SODIUM BICARBONATE 8.4 % IV SOLN
INTRAVENOUS | Status: DC | PRN
  Administered 2023-02-05: 100 meq via INTRAVENOUS

## 2023-02-05 MED ORDER — MIDAZOLAM HCL 2 MG/2ML IJ SOLN
INTRAMUSCULAR | Status: DC | PRN
  Administered 2023-02-05: 1 mg via INTRAVENOUS
  Administered 2023-02-05: 1 mg

## 2023-02-05 MED ORDER — HEPARIN (PORCINE) IN NACL 1000-0.9 UT/500ML-% IV SOLN
INTRAVENOUS | Status: DC | PRN
  Administered 2023-02-05 (×4): 500 mL

## 2023-02-05 MED ORDER — PHENYLEPHRINE 80 MCG/ML (10ML) SYRINGE FOR IV PUSH (FOR BLOOD PRESSURE SUPPORT)
PREFILLED_SYRINGE | INTRAVENOUS | Status: AC
Start: 2023-02-05 — End: ?
  Filled 2023-02-05: qty 10

## 2023-02-05 MED FILL — Nitroglycerin IV Soln 100 MCG/ML in D5W: INTRA_ARTERIAL | Qty: 10 | Status: AC

## 2023-02-05 MED FILL — Amiodarone HCl Inj 150 MG/3ML (50 MG/ML): INTRAVENOUS | Qty: 3 | Status: AC

## 2023-02-05 SURGICAL SUPPLY — 26 items
BALLN SAPPHIRE 2.5X12 (BALLOONS) ×1
BALLN ~~LOC~~ EMERGE MR 2.5X12 (BALLOONS) ×1
BALLOON SAPPHIRE 2.5X12 (BALLOONS) IMPLANT
BALLOON ~~LOC~~ EMERGE MR 2.5X12 (BALLOONS) IMPLANT
CATH GUIDELINER COAST (CATHETERS) IMPLANT
CATH INFINITI 5FR ANG PIGTAIL (CATHETERS) IMPLANT
CATH LAUNCHER 6FR EBU3.5 (CATHETERS) IMPLANT
CATH LAUNCHER 6FR JR4 (CATHETERS) IMPLANT
DEVICE RAD COMP TR BAND LRG (VASCULAR PRODUCTS) IMPLANT
ELECT DEFIB PAD ADLT CADENCE (PAD) IMPLANT
GLIDESHEATH SLEND SS 6F .021 (SHEATH) IMPLANT
GUIDEWIRE VAS SION BLUE 190 (WIRE) IMPLANT
KIT ENCORE 26 ADVANTAGE (KITS) IMPLANT
KIT HEMO VALVE WATCHDOG (MISCELLANEOUS) IMPLANT
KIT SINGLE USE MANIFOLD (KITS) IMPLANT
PACK CARDIAC CATHETERIZATION (CUSTOM PROCEDURE TRAY) ×2 IMPLANT
PROTECTION STATION PRESSURIZED (MISCELLANEOUS) ×1
SET ATX-X65L (MISCELLANEOUS) IMPLANT
STATION PROTECTION PRESSURIZED (MISCELLANEOUS) IMPLANT
STENT SYNERGY XD 2.50X12 (Permanent Stent) IMPLANT
STENT SYNERGY XD 2.50X16 (Permanent Stent) IMPLANT
SYNERGY XD 2.50X12 (Permanent Stent) ×1 IMPLANT
SYNERGY XD 2.50X16 (Permanent Stent) ×1 IMPLANT
TUBING CIL FLEX 10 FLL-RA (TUBING) IMPLANT
WIRE ASAHI PROWATER 180CM (WIRE) IMPLANT
WIRE EMERALD 3MM-J .035X260CM (WIRE) IMPLANT

## 2023-02-05 SURGICAL SUPPLY — 10 items
CATH INFINITI 5FR MULTPACK ANG (CATHETERS) IMPLANT
CLOSURE PERCLOSE PROSTYLE (VASCULAR PRODUCTS) IMPLANT
KIT SINGLE USE MANIFOLD (KITS) IMPLANT
KIT SYRINGE INJ CVI SPIKEX1 (MISCELLANEOUS) IMPLANT
PACK CARDIAC CATHETERIZATION (CUSTOM PROCEDURE TRAY) ×2 IMPLANT
SET ATX-X65L (MISCELLANEOUS) IMPLANT
SHEATH PINNACLE 6F 10CM (SHEATH) IMPLANT
TUBING CIL FLEX 10 FLL-RA (TUBING) IMPLANT
WIRE EMERALD 3MM-J .035X260CM (WIRE) IMPLANT
WIRE MICRO SET SILHO 5FR 7 (SHEATH) IMPLANT

## 2023-02-05 NOTE — H&P (View-Only) (Signed)
Patient ID: SHOMARI MCGLONE, male   DOB: May 05, 1950, 73 y.o.   MRN: [id number]     Advanced Heart Failure Rounding Note  Cardiologist: None  Chief Complaint: Anterior MI Subjective:    Denies chest pain.   Objective:   Weight Range:   There is no height or weight on file to calculate BMI.   Vital Signs:   Temp:  [97.8 F (36.6 C)-99 F (37.2 C)] 98.5 F (36.9 C) (01/20 0403) Pulse Rate:  [53-74] 64 (01/20 0700) Resp:  [9-28] 12 (01/20 0700) BP: (104-150)/(66-103) 133/87 (01/20 0700) SpO2:  [93 %-98 %] 95 % (01/20 0700) Last BM Date : 02/04/23  Weight change: There were no vitals filed for this visit.  Intake/Output:   Intake/Output Summary (Last 24 hours) at 02/05/2023 2440 Last data filed at 02/05/2023 0700 Gross per 24 hour  Intake 412.05 ml  Output 450 ml  Net -37.95 ml      Physical Exam    General:  Well appearing. No resp difficulty HEENT: normal Neck: supple. no JVD. Carotids 2+ bilat; no bruits. No lymphadenopathy or thryomegaly appreciated. Cor: PMI nondisplaced. Regular rate & rhythm. No rubs, gallops or murmurs. Lungs: clear Abdomen: soft, nontender, nondistended. No hepatosplenomegaly. No bruits or masses. Good bowel sounds. Extremities: no cyanosis, clubbing, rash, edema Neuro: alert & orientedx3, cranial nerves grossly intact. moves all 4 extremities w/o difficulty. Affect pleasant  Telemetry   SR with occasional PVCs.   Labs    CBC Recent Labs    02/03/23 1440 02/04/23 0932 02/05/23 0353  WBC 11.8* 10.5 7.7  NEUTROABS 9.0*  --   --   HGB 13.4 14.3 13.9  HCT 37.3* 39.4 38.0*  MCV 92.3 90.6 92.7  PLT 134* 154 131*   Basic Metabolic Panel Recent Labs    11/12/23 0932 02/05/23 0353  NA 136 136  K 3.9 3.8  CL 103 104  CO2 23 23  GLUCOSE 119* 114*  BUN 12 10  CREATININE 1.08 1.06  CALCIUM 9.0 8.7*  MG  --  1.8   Liver Function Tests Recent Labs    02/03/23 1440  AST 24  ALT 17  ALKPHOS 48  BILITOT 1.3*  PROT 6.0*   ALBUMIN 3.2*   No results for input(s): "LIPASE", "AMYLASE" in the last 72 hours. Cardiac Enzymes No results for input(s): "CKTOTAL", "CKMB", "CKMBINDEX", "TROPONINI" in the last 72 hours.  BNP: BNP (last 3 results) No results for input(s): "BNP" in the last 8760 hours.  ProBNP (last 3 results) No results for input(s): "PROBNP" in the last 8760 hours.   D-Dimer No results for input(s): "DDIMER" in the last 72 hours. Hemoglobin A1C Recent Labs    02/03/23 1440  HGBA1C 5.3   Fasting Lipid Panel Recent Labs    02/03/23 1440  CHOL 139  HDL 40*  LDLCALC 88  TRIG 53  CHOLHDL 3.5   Thyroid Function Tests No results for input(s): "TSH", "T4TOTAL", "T3FREE", "THYROIDAB" in the last 72 hours.  Invalid input(s): "FREET3"  Other results:   Imaging    ECHOCARDIOGRAM COMPLETE Result Date: 02/04/2023    ECHOCARDIOGRAM REPORT   Patient Name:   LINWOOD FRIIS Date of Exam: 02/04/2023 Medical Rec #:  [id number]       Height:       72.0 in Accession #:    [id number]      Weight:       168.0 lb Date of Birth:  09-02-1950       BSA:  1.978 m Patient Age:    72 years        BP:           120/70 mmHg Patient Gender: M               HR:           66 bpm. Exam Location:  Inpatient Procedure: 2D Echo, Color Doppler, Cardiac Doppler and Intracardiac            Opacification Agent Indications:    Acute MI i41.9  History:        Patient has no prior history of Echocardiogram examinations.                 Risk Factors:Hypertension and Dyslipidemia.  Sonographer:    Irving Burton Senior RDCS Referring Phys: [id number] [name] IMPRESSIONS  1. Swirling noted at LV apex but no thrombus.  2. Left ventricular ejection fraction, by estimation, is 35 to 40%. The left ventricle has moderately decreased function. The left ventricle demonstrates regional wall motion abnormalities (see scoring diagram/findings for description). Left ventricular  diastolic parameters were normal.  3. Right ventricular  systolic function is normal. The right ventricular size is mildly enlarged. Tricuspid regurgitation signal is inadequate for assessing PA pressure.  4. Right atrial size was mildly dilated.  5. The mitral valve is normal in structure. Trivial mitral valve regurgitation.  6. The aortic valve is tricuspid. Aortic valve regurgitation is not visualized.  7. There is mild dilatation of the ascending aorta, measuring 44 mm.  8. The inferior vena cava is dilated in size with <50% respiratory variability, suggesting right atrial pressure of 15 mmHg. FINDINGS  Left Ventricle: Left ventricular ejection fraction, by estimation, is 35 to 40%. The left ventricle has moderately decreased function. The left ventricle demonstrates regional wall motion abnormalities. Definity contrast agent was given IV to delineate the left ventricular endocardial borders. The left ventricular internal cavity size was normal in size. There is no left ventricular hypertrophy. Left ventricular diastolic parameters were normal.  LV Wall Scoring: The mid and distal anterior septum, mid inferoseptal segment, apical anterior segment, apical inferior segment, and apex are akinetic. The anterior wall, entire lateral wall, inferior wall, basal anteroseptal segment, and basal inferoseptal segment are normal. Right Ventricle: The right ventricular size is mildly enlarged. No increase in right ventricular wall thickness. Right ventricular systolic function is normal. Tricuspid regurgitation signal is inadequate for assessing PA pressure. Left Atrium: Left atrial size was normal in size. Right Atrium: Right atrial size was mildly dilated. Pericardium: Trivial pericardial effusion is present. Mitral Valve: The mitral valve is normal in structure. Trivial mitral valve regurgitation. Tricuspid Valve: The tricuspid valve is normal in structure. Tricuspid valve regurgitation is trivial. Aortic Valve: The aortic valve is tricuspid. Aortic valve regurgitation is not  visualized. Pulmonic Valve: The pulmonic valve was normal in structure. Pulmonic valve regurgitation is trivial. Aorta: The aortic root is normal in size and structure. There is mild dilatation of the ascending aorta, measuring 44 mm. Venous: The inferior vena cava is dilated in size with less than 50% respiratory variability, suggesting right atrial pressure of 15 mmHg. IAS/Shunts: No atrial level shunt detected by color flow Doppler. Additional Comments: Swirling noted at LV apex but no thrombus.  LEFT VENTRICLE PLAX 2D LVIDd:         5.20 cm      Diastology LVIDs:         4.30 cm  LV e' medial:    8.70 cm/s LV PW:         0.80 cm      LV E/e' medial:  9.4 LV IVS:        0.70 cm      LV e' lateral:   9.46 cm/s LVOT diam:     1.90 cm      LV E/e' lateral: 8.6 LV SV:         69 LV SV Index:   35 LVOT Area:     2.84 cm  LV Volumes (MOD) LV vol d, MOD A2C: 107.0 ml LV vol d, MOD A4C: 117.0 ml LV vol s, MOD A2C: 55.4 ml LV vol s, MOD A4C: 74.5 ml LV SV MOD A2C:     51.6 ml LV SV MOD A4C:     117.0 ml LV SV MOD BP:      49.5 ml RIGHT VENTRICLE RV S prime:     15.40 cm/s TAPSE (M-mode): 3.2 cm LEFT ATRIUM             Index        RIGHT ATRIUM           Index LA diam:        3.70 cm 1.87 cm/m   RA Area:     22.80 cm LA Vol (A2C):   64.8 ml 32.75 ml/m  RA Volume:   71.80 ml  36.29 ml/m LA Vol (A4C):   47.3 ml 23.91 ml/m LA Biplane Vol: 60.4 ml 30.53 ml/m  AORTIC VALVE LVOT Vmax:   113.00 cm/s LVOT Vmean:  84.100 cm/s LVOT VTI:    0.245 m  AORTA Ao Root diam: 3.50 cm Ao Asc diam:  4.40 cm MITRAL VALVE               TRICUSPID VALVE MV Area (PHT): 3.63 cm    TR Peak grad:   21.7 mmHg MV Decel Time: 209 msec    TR Vmax:        233.00 cm/s MV E velocity: 81.80 cm/s MV A velocity: 63.90 cm/s  SHUNTS MV E/A ratio:  1.28        Systemic VTI:  0.24 m                            Systemic Diam: 1.90 cm Olga Millers MD Electronically signed by Olga Millers MD Signature Date/Time: 02/04/2023/11:19:27 AM    Final       Medications:     Scheduled Medications:  [START ON 02/06/2023] aspirin  81 mg Oral Daily   atorvastatin  80 mg Oral Daily   Chlorhexidine Gluconate Cloth  6 each Topical Daily   donepezil  10 mg Oral QHS   enoxaparin (LOVENOX) injection  40 mg Subcutaneous Q24H   heparin  4,000 Units Intravenous Once   metoprolol succinate  25 mg Oral QHS   sodium chloride flush  3 mL Intravenous Q12H   sodium chloride flush  3-10 mL Intravenous Q12H   ticagrelor  90 mg Oral BID    Infusions:  sodium chloride 10 mL/hr at 02/05/23 0747   amiodarone 30 mg/hr (02/05/23 0700)   magnesium sulfate bolus IVPB      PRN Medications: acetaminophen, ondansetron (ZOFRAN) IV, mouth rinse, sodium chloride flush, sodium chloride flush   Assessment/Plan   1. CAD: Anterior STEMI with cath showing occluded mid LAD, 90% stenosis OM1 branch, 70% dRCA.  DES  to LAD.  Returning to Cath lab  today for  staged PCI OM and RCA.  - No chest pain.  - Continue ASA 81 + ticagrelor.  - Atorvastatin 80 daily.  2. Acute systolic CHF: Ischemic cardiomyopathy.  Echo EF 35-40% with mid-apical anteroseptal, apical anterior, apical inferior, and true apex akinesis with no LV thrombus, RV normal.  -Start spironolactone 12.5 daily tomorrow.  - Eventual Entresto.  - Renal function status stable.  - Repeat echo at 3 months to assess for recovery.  3. Early onset dementia: Seems mild.  - On donepezil.  4. VT: Patient had VT during PCI on 1/18, amiodarone started.  He had a few short NSVT runs overnight.  - Continue amio drip today.    Length of Stay: 2  Tonye Becket, NP  02/05/2023, 8:12 AM  Advanced Heart Failure Team Pager [phone] (M-F; 7a - 5p)  Please contact [facility] for night-coverage after hours (5p -7a ) and weekends on [email or web address]  Patient seen and examined with the above-signed Advanced Practice Provider and/or Housestaff. I personally reviewed laboratory data, imaging studies and relevant notes. I  independently examined the patient and formulated the important aspects of the plan. I have edited the note to reflect any of my changes or salient points. I have personally discussed the plan with the patient and/or family.  Denies CP or SOB. No further VT on IV amio.   Has frequent junctiona rhythm on tele  General:  Sitting up in bed. No resp difficulty HEENT: normal Neck: supple. no JVD. Carotids 2+ bilat; no bruits. No lymphadenopathy or thryomegaly appreciated. Cor: PMI nondisplaced. Regular rate & rhythm. No rubs, gallops or murmurs. Lungs: clear Abdomen: soft, nontender, nondistended. No hepatosplenomegaly. No bruits or masses. Good bowel sounds. Extremities: no cyanosis, clubbing, rash, edema Neuro: alert & orientedx3, cranial nerves grossly intact. moves all 4 extremities w/o difficulty. Affect pleasant  Cath films reviewed personally. For staged PCI of RCA and OM today. Continue DAPT/statin.  Tele with competing junctional rhythm and sinus. Can stop amio    Titrate GDMT post cath.   Arvilla Meres, MD  9:55 AM

## 2023-02-05 NOTE — TOC Initial Note (Signed)
Transition of Care [facility]) - Initial/Assessment Note    Patient Details  Name: Brendan Holland MRN: [id number] Date of Birth: 07-Dec-1950  Transition of [facility] CM/SW Contact:    Elliot Cousin, RN Phone Number: [phone] 02/05/2023, 5:01 PM  Clinical Narrative:                 HF TOC CM spoke to pt and states he was independent pta. He drives to his appts. Discuss with pt daily weights and heart healthy/low sodium diet.  Will continue to follow for dc needs.   Expected Discharge Plan: Home/Self Care Barriers to Discharge: Continued Medical Work up   Patient Goals and CMS Choice Patient states their goals for this hospitalization and ongoing recovery are:: wants to recover          Expected Discharge Plan and Services   Discharge Planning Services: CM Consult   Living arrangements for the past 2 months: Single Family Home                                      Prior Living Arrangements/Services Living arrangements for the past 2 months: Single Family Home Lives with:: Spouse Patient language and need for interpreter reviewed:: Yes Do you feel safe going back to the place where you live?: Yes      Need for Family Participation in Patient Care: No (Comment) Care giver support system in place?: No (comment)   Criminal Activity/Legal Involvement Pertinent to Current Situation/Hospitalization: No - Comment as needed  Activities of Daily Living   ADL Screening (condition at time of admission) Independently performs ADLs?: Yes (appropriate for developmental age) Is the patient deaf or have difficulty hearing?: No Does the patient have difficulty seeing, even when wearing glasses/contacts?: No Does the patient have difficulty concentrating, remembering, or making decisions?: Yes  Permission Sought/Granted Permission sought to share information with : Case Manager, Family Supports, PCP Permission granted to share information with : Yes, Verbal Permission  Granted  Share Information with NAME: Antonino Schoettle     Permission granted to share info w Relationship: wife  Permission granted to share info w Contact Information: [phone]  Emotional Assessment Appearance:: Appears stated age Attitude/Demeanor/Rapport: Engaged Affect (typically observed): Accepting Orientation: : Oriented to Self, Oriented to Place, Oriented to  Time, Oriented to Situation   Psych Involvement: No (comment)  Admission diagnosis:  ST elevation myocardial infarction (STEMI), unspecified artery (HCC) [I21.3] STEMI involving left anterior descending coronary artery (HCC) [I21.02] Patient Active Problem List   Diagnosis Date Noted   STEMI involving left anterior descending coronary artery (HCC) 02/03/2023   Dupuytren's disease of palm with nodules without contracture 07/29/2021   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 02/16/2021   Radiculopathy, lumbar region 01/18/2021   Trigger finger, right ring finger 11/05/2020   S/P total knee arthroplasty, right 10/22/2019   Primary osteoarthritis of first carpometacarpal joint of right hand 11/20/2017   Sprain of right wrist 08/10/2017   Lumbar spondylosis 04/05/2016   PCP:  Harvie Heck, MD Pharmacy:   [facility] [id number] - [place], [place] - [address] [place] [address] Phone: [phone] Fax: [phone]     Social Drivers of Health (SDOH) Social History: SDOH Screenings   Food Insecurity: No Food Insecurity (02/03/2023)  Housing: Low Risk  (02/03/2023)  Transportation Needs: No Transportation  Needs (02/03/2023)  Utilities: Not At Risk (02/03/2023)  Financial Resource Strain: Low Risk  (01/09/2023)   Received from [facility]  Physical Activity: Sufficiently Active (01/09/2023)   Received from [facility]  Social Connections: Socially Isolated (02/04/2023)  Stress: No Stress Concern Present (01/09/2023)   Received from [facility]   Tobacco Use: Medium Risk (01/09/2023)   Received from [facility]   SDOH Interventions:     Readmission Risk Interventions     No data to display

## 2023-02-05 NOTE — Interval H&P Note (Signed)
History and Physical Interval Note:  02/05/2023 12:35 PM  Brendan Holland  has presented today for surgery, with the diagnosis of cad.  The various methods of treatment have been discussed with the patient and family. After consideration of risks, benefits and other options for treatment, the patient has consented to  Procedure(s): CORONARY STENT INTERVENTION (N/A) as a surgical intervention.  The patient's history has been reviewed, patient examined, no change in status, stable for surgery.  I have reviewed the patient's chart and labs.  Questions were answered to the patient's satisfaction.     [name]

## 2023-02-05 NOTE — Telephone Encounter (Signed)
Patient Product/process development scientist completed.    The patient is insured through [employer]/ [employer]. Patient has Medicare and is not eligible for a copay card, but may be able to apply for patient assistance or Medicare RX Payment Plan (Patient Must reach out to their plan, if eligible for payment plan), if available.    Ran test claim for Brilinta 90 mg and the current 30 day co-pay is $47.00.   This test claim was processed through [facility]- copay amounts may vary at other pharmacies due to pharmacy/plan contracts, or as the patient moves through the different stages of their insurance plan.     Roland Earl, CPHT Pharmacy Technician III Certified Patient Advocate [facility] Patient Advocate Team Direct Number: [phone]  Fax: [phone]

## 2023-02-05 NOTE — Plan of Care (Signed)
  Problem: Education: Goal: Knowledge of General Education information will improve Description: Including pain rating scale, medication(s)/side effects and non-pharmacologic comfort measures Outcome: Progressing   Problem: Health Behavior/Discharge Planning: Goal: Ability to manage health-related needs will improve Outcome: Progressing   Problem: Clinical Measurements: Goal: Ability to maintain clinical measurements within normal limits will improve Outcome: Progressing Goal: Will remain free from infection Outcome: Progressing Goal: Diagnostic test results will improve Outcome: Progressing Goal: Respiratory complications will improve Outcome: Progressing Goal: Cardiovascular complication will be avoided Outcome: Progressing   Problem: Cardiovascular: Goal: Ability to achieve and maintain adequate cardiovascular perfusion will improve Outcome: Progressing Goal: Vascular access site(s) Level 0-1 will be maintained Outcome: Progressing

## 2023-02-05 NOTE — Progress Notes (Addendum)
Patient ID: Brendan Holland, male   DOB: May 05, 1950, 73 y.o.   MRN: [id number]     Advanced Heart Failure Rounding Note  Cardiologist: None  Chief Complaint: Anterior MI Subjective:    Denies chest pain.   Objective:   Weight Range:   There is no height or weight on file to calculate BMI.   Vital Signs:   Temp:  [97.8 F (36.6 C)-99 F (37.2 C)] 98.5 F (36.9 C) (01/20 0403) Pulse Rate:  [53-74] 64 (01/20 0700) Resp:  [9-28] 12 (01/20 0700) BP: (104-150)/(66-103) 133/87 (01/20 0700) SpO2:  [93 %-98 %] 95 % (01/20 0700) Last BM Date : 02/04/23  Weight change: There were no vitals filed for this visit.  Intake/Output:   Intake/Output Summary (Last 24 hours) at 02/05/2023 2440 Last data filed at 02/05/2023 0700 Gross per 24 hour  Intake 412.05 ml  Output 450 ml  Net -37.95 ml      Physical Exam    General:  Well appearing. No resp difficulty HEENT: normal Neck: supple. no JVD. Carotids 2+ bilat; no bruits. No lymphadenopathy or thryomegaly appreciated. Cor: PMI nondisplaced. Regular rate & rhythm. No rubs, gallops or murmurs. Lungs: clear Abdomen: soft, nontender, nondistended. No hepatosplenomegaly. No bruits or masses. Good bowel sounds. Extremities: no cyanosis, clubbing, rash, edema Neuro: alert & orientedx3, cranial nerves grossly intact. moves all 4 extremities w/o difficulty. Affect pleasant  Telemetry   SR with occasional PVCs.   Labs    CBC Recent Labs    02/03/23 1440 02/04/23 0932 02/05/23 0353  WBC 11.8* 10.5 7.7  NEUTROABS 9.0*  --   --   HGB 13.4 14.3 13.9  HCT 37.3* 39.4 38.0*  MCV 92.3 90.6 92.7  PLT 134* 154 131*   Basic Metabolic Panel Recent Labs    11/12/23 0932 02/05/23 0353  NA 136 136  K 3.9 3.8  CL 103 104  CO2 23 23  GLUCOSE 119* 114*  BUN 12 10  CREATININE 1.08 1.06  CALCIUM 9.0 8.7*  MG  --  1.8   Liver Function Tests Recent Labs    02/03/23 1440  AST 24  ALT 17  ALKPHOS 48  BILITOT 1.3*  PROT 6.0*   ALBUMIN 3.2*   No results for input(s): "LIPASE", "AMYLASE" in the last 72 hours. Cardiac Enzymes No results for input(s): "CKTOTAL", "CKMB", "CKMBINDEX", "TROPONINI" in the last 72 hours.  BNP: BNP (last 3 results) No results for input(s): "BNP" in the last 8760 hours.  ProBNP (last 3 results) No results for input(s): "PROBNP" in the last 8760 hours.   D-Dimer No results for input(s): "DDIMER" in the last 72 hours. Hemoglobin A1C Recent Labs    02/03/23 1440  HGBA1C 5.3   Fasting Lipid Panel Recent Labs    02/03/23 1440  CHOL 139  HDL 40*  LDLCALC 88  TRIG 53  CHOLHDL 3.5   Thyroid Function Tests No results for input(s): "TSH", "T4TOTAL", "T3FREE", "THYROIDAB" in the last 72 hours.  Invalid input(s): "FREET3"  Other results:   Imaging    ECHOCARDIOGRAM COMPLETE Result Date: 02/04/2023    ECHOCARDIOGRAM REPORT   Patient Name:   Brendan Holland Date of Exam: 02/04/2023 Medical Rec #:  [id number]       Height:       72.0 in Accession #:    [id number]      Weight:       168.0 lb Date of Birth:  09-02-1950       BSA:  1.978 m Patient Age:    72 years        BP:           120/70 mmHg Patient Gender: M               HR:           66 bpm. Exam Location:  Inpatient Procedure: 2D Echo, Color Doppler, Cardiac Doppler and Intracardiac            Opacification Agent Indications:    Acute MI i41.9  History:        Patient has no prior history of Echocardiogram examinations.                 Risk Factors:Hypertension and Dyslipidemia.  Sonographer:    Irving Burton Senior RDCS Referring Phys: [id number] [name] IMPRESSIONS  1. Swirling noted at LV apex but no thrombus.  2. Left ventricular ejection fraction, by estimation, is 35 to 40%. The left ventricle has moderately decreased function. The left ventricle demonstrates regional wall motion abnormalities (see scoring diagram/findings for description). Left ventricular  diastolic parameters were normal.  3. Right ventricular  systolic function is normal. The right ventricular size is mildly enlarged. Tricuspid regurgitation signal is inadequate for assessing PA pressure.  4. Right atrial size was mildly dilated.  5. The mitral valve is normal in structure. Trivial mitral valve regurgitation.  6. The aortic valve is tricuspid. Aortic valve regurgitation is not visualized.  7. There is mild dilatation of the ascending aorta, measuring 44 mm.  8. The inferior vena cava is dilated in size with <50% respiratory variability, suggesting right atrial pressure of 15 mmHg. FINDINGS  Left Ventricle: Left ventricular ejection fraction, by estimation, is 35 to 40%. The left ventricle has moderately decreased function. The left ventricle demonstrates regional wall motion abnormalities. Definity contrast agent was given IV to delineate the left ventricular endocardial borders. The left ventricular internal cavity size was normal in size. There is no left ventricular hypertrophy. Left ventricular diastolic parameters were normal.  LV Wall Scoring: The mid and distal anterior septum, mid inferoseptal segment, apical anterior segment, apical inferior segment, and apex are akinetic. The anterior wall, entire lateral wall, inferior wall, basal anteroseptal segment, and basal inferoseptal segment are normal. Right Ventricle: The right ventricular size is mildly enlarged. No increase in right ventricular wall thickness. Right ventricular systolic function is normal. Tricuspid regurgitation signal is inadequate for assessing PA pressure. Left Atrium: Left atrial size was normal in size. Right Atrium: Right atrial size was mildly dilated. Pericardium: Trivial pericardial effusion is present. Mitral Valve: The mitral valve is normal in structure. Trivial mitral valve regurgitation. Tricuspid Valve: The tricuspid valve is normal in structure. Tricuspid valve regurgitation is trivial. Aortic Valve: The aortic valve is tricuspid. Aortic valve regurgitation is not  visualized. Pulmonic Valve: The pulmonic valve was normal in structure. Pulmonic valve regurgitation is trivial. Aorta: The aortic root is normal in size and structure. There is mild dilatation of the ascending aorta, measuring 44 mm. Venous: The inferior vena cava is dilated in size with less than 50% respiratory variability, suggesting right atrial pressure of 15 mmHg. IAS/Shunts: No atrial level shunt detected by color flow Doppler. Additional Comments: Swirling noted at LV apex but no thrombus.  LEFT VENTRICLE PLAX 2D LVIDd:         5.20 cm      Diastology LVIDs:         4.30 cm  LV e' medial:    8.70 cm/s LV PW:         0.80 cm      LV E/e' medial:  9.4 LV IVS:        0.70 cm      LV e' lateral:   9.46 cm/s LVOT diam:     1.90 cm      LV E/e' lateral: 8.6 LV SV:         69 LV SV Index:   35 LVOT Area:     2.84 cm  LV Volumes (MOD) LV vol d, MOD A2C: 107.0 ml LV vol d, MOD A4C: 117.0 ml LV vol s, MOD A2C: 55.4 ml LV vol s, MOD A4C: 74.5 ml LV SV MOD A2C:     51.6 ml LV SV MOD A4C:     117.0 ml LV SV MOD BP:      49.5 ml RIGHT VENTRICLE RV S prime:     15.40 cm/s TAPSE (M-mode): 3.2 cm LEFT ATRIUM             Index        RIGHT ATRIUM           Index LA diam:        3.70 cm 1.87 cm/m   RA Area:     22.80 cm LA Vol (A2C):   64.8 ml 32.75 ml/m  RA Volume:   71.80 ml  36.29 ml/m LA Vol (A4C):   47.3 ml 23.91 ml/m LA Biplane Vol: 60.4 ml 30.53 ml/m  AORTIC VALVE LVOT Vmax:   113.00 cm/s LVOT Vmean:  84.100 cm/s LVOT VTI:    0.245 m  AORTA Ao Root diam: 3.50 cm Ao Asc diam:  4.40 cm MITRAL VALVE               TRICUSPID VALVE MV Area (PHT): 3.63 cm    TR Peak grad:   21.7 mmHg MV Decel Time: 209 msec    TR Vmax:        233.00 cm/s MV E velocity: 81.80 cm/s MV A velocity: 63.90 cm/s  SHUNTS MV E/A ratio:  1.28        Systemic VTI:  0.24 m                            Systemic Diam: 1.90 cm Olga Millers MD Electronically signed by Olga Millers MD Signature Date/Time: 02/04/2023/11:19:27 AM    Final       Medications:     Scheduled Medications:  [START ON 02/06/2023] aspirin  81 mg Oral Daily   atorvastatin  80 mg Oral Daily   Chlorhexidine Gluconate Cloth  6 each Topical Daily   donepezil  10 mg Oral QHS   enoxaparin (LOVENOX) injection  40 mg Subcutaneous Q24H   heparin  4,000 Units Intravenous Once   metoprolol succinate  25 mg Oral QHS   sodium chloride flush  3 mL Intravenous Q12H   sodium chloride flush  3-10 mL Intravenous Q12H   ticagrelor  90 mg Oral BID    Infusions:  sodium chloride 10 mL/hr at 02/05/23 0747   amiodarone 30 mg/hr (02/05/23 0700)   magnesium sulfate bolus IVPB      PRN Medications: acetaminophen, ondansetron (ZOFRAN) IV, mouth rinse, sodium chloride flush, sodium chloride flush   Assessment/Plan   1. CAD: Anterior STEMI with cath showing occluded mid LAD, 90% stenosis OM1 branch, 70% dRCA.  DES  to LAD.  Returning to Cath lab  today for  staged PCI OM and RCA.  - No chest pain.  - Continue ASA 81 + ticagrelor.  - Atorvastatin 80 daily.  2. Acute systolic CHF: Ischemic cardiomyopathy.  Echo EF 35-40% with mid-apical anteroseptal, apical anterior, apical inferior, and true apex akinesis with no LV thrombus, RV normal.  -Start spironolactone 12.5 daily tomorrow.  - Eventual Entresto.  - Renal function status stable.  - Repeat echo at 3 months to assess for recovery.  3. Early onset dementia: Seems mild.  - On donepezil.  4. VT: Patient had VT during PCI on 1/18, amiodarone started.  He had a few short NSVT runs overnight.  - Continue amio drip today.    Length of Stay: 2  Tonye Becket, NP  02/05/2023, 8:12 AM  Advanced Heart Failure Team Pager [phone] (M-F; 7a - 5p)  Please contact [facility] for night-coverage after hours (5p -7a ) and weekends on [email or web address]  Patient seen and examined with the above-signed Advanced Practice Provider and/or Housestaff. I personally reviewed laboratory data, imaging studies and relevant notes. I  independently examined the patient and formulated the important aspects of the plan. I have edited the note to reflect any of my changes or salient points. I have personally discussed the plan with the patient and/or family.  Denies CP or SOB. No further VT on IV amio.   Has frequent junctiona rhythm on tele  General:  Sitting up in bed. No resp difficulty HEENT: normal Neck: supple. no JVD. Carotids 2+ bilat; no bruits. No lymphadenopathy or thryomegaly appreciated. Cor: PMI nondisplaced. Regular rate & rhythm. No rubs, gallops or murmurs. Lungs: clear Abdomen: soft, nontender, nondistended. No hepatosplenomegaly. No bruits or masses. Good bowel sounds. Extremities: no cyanosis, clubbing, rash, edema Neuro: alert & orientedx3, cranial nerves grossly intact. moves all 4 extremities w/o difficulty. Affect pleasant  Cath films reviewed personally. For staged PCI of RCA and OM today. Continue DAPT/statin.  Tele with competing junctional rhythm and sinus. Can stop amio    Titrate GDMT post cath.   Arvilla Meres, MD  9:55 AM

## 2023-02-06 ENCOUNTER — Encounter (HOSPITAL_COMMUNITY): Payer: Self-pay | Admitting: Internal Medicine

## 2023-02-06 ENCOUNTER — Telehealth: Payer: Self-pay | Admitting: Student

## 2023-02-06 ENCOUNTER — Other Ambulatory Visit (HOSPITAL_COMMUNITY): Payer: Self-pay

## 2023-02-06 ENCOUNTER — Telehealth (HOSPITAL_COMMUNITY): Payer: Self-pay

## 2023-02-06 DIAGNOSIS — I2102 ST elevation (STEMI) myocardial infarction involving left anterior descending coronary artery: Secondary | ICD-10-CM | POA: Diagnosis not present

## 2023-02-06 LAB — CBC
HCT: 37.7 % — ABNORMAL LOW (ref 39.0–52.0)
Hemoglobin: 13.6 g/dL (ref 13.0–17.0)
MCH: 33.4 pg (ref 26.0–34.0)
MCHC: 36.1 g/dL — ABNORMAL HIGH (ref 30.0–36.0)
MCV: 92.6 fL (ref 80.0–100.0)
Platelets: 141 10*3/uL — ABNORMAL LOW (ref 150–400)
RBC: 4.07 MIL/uL — ABNORMAL LOW (ref 4.22–5.81)
RDW: 12.3 % (ref 11.5–15.5)
WBC: 8.2 10*3/uL (ref 4.0–10.5)
nRBC: 0 % (ref 0.0–0.2)

## 2023-02-06 LAB — BASIC METABOLIC PANEL
Anion gap: 8 (ref 5–15)
BUN: 10 mg/dL (ref 8–23)
CO2: 25 mmol/L (ref 22–32)
Calcium: 8.6 mg/dL — ABNORMAL LOW (ref 8.9–10.3)
Chloride: 104 mmol/L (ref 98–111)
Creatinine, Ser: 1.1 mg/dL (ref 0.61–1.24)
GFR, Estimated: >60 mL/min (ref >60)
Glucose, Bld: 129 mg/dL — ABNORMAL HIGH (ref 70–99)
Potassium: 3.8 mmol/L (ref 3.5–5.1)
Sodium: 137 mmol/L (ref 135–145)

## 2023-02-06 LAB — LIPOPROTEIN A (LPA): Lipoprotein (a): 10.4 nmol/L (ref <75.0)

## 2023-02-06 MED ORDER — PANTOPRAZOLE SODIUM 40 MG PO TBEC
40.0000 mg | DELAYED_RELEASE_TABLET | Freq: Every day | ORAL | 6 refills | Status: AC
  Filled 2023-02-06: qty 30, 30d supply, fill #0

## 2023-02-06 MED ORDER — ATORVASTATIN CALCIUM 80 MG PO TABS
80.0000 mg | ORAL_TABLET | Freq: Every day | ORAL | 6 refills | Status: DC
  Filled 2023-02-06: qty 30, 30d supply, fill #0

## 2023-02-06 MED ORDER — DAPAGLIFLOZIN PROPANEDIOL 10 MG PO TABS
10.0000 mg | ORAL_TABLET | Freq: Every day | ORAL | Status: DC
Start: 2023-02-06 — End: 2023-02-06
  Filled 2023-02-06: qty 1

## 2023-02-06 MED ORDER — SPIRONOLACTONE 25 MG PO TABS
12.5000 mg | ORAL_TABLET | Freq: Every day | ORAL | 6 refills | Status: DC
  Filled 2023-02-06: qty 15, 30d supply, fill #0

## 2023-02-06 MED ORDER — SPIRONOLACTONE 12.5 MG HALF TABLET
12.5000 mg | ORAL_TABLET | Freq: Every day | ORAL | Status: DC
  Administered 2023-02-06: 12.5 mg via ORAL
  Filled 2023-02-06: qty 1

## 2023-02-06 MED ORDER — TICAGRELOR 90 MG PO TABS
90.0000 mg | ORAL_TABLET | Freq: Two times a day (BID) | ORAL | 6 refills | Status: DC
  Filled 2023-02-06: qty 60, 30d supply, fill #0

## 2023-02-06 MED ORDER — METOPROLOL SUCCINATE ER 25 MG PO TB24
12.5000 mg | ORAL_TABLET | Freq: Every day | ORAL | 6 refills | Status: DC
  Filled 2023-02-06: qty 15, 30d supply, fill #0

## 2023-02-06 MED FILL — Norepinephrine-Dextrose IV Solution 4 MG/250ML-5%: INTRAVENOUS | Qty: 250 | Status: AC

## 2023-02-06 NOTE — Progress Notes (Addendum)
Discharge order in for pt to go home today.  Discharge instructions reviewed with pt. Handouts on new medications and radial site care provided.  Copy of instructions given to pt. [facility] TOC  Pharmacy has filled pt's scripts and will be picked up on the way to the discharge lounge. Pt states his ride will be coming around noon.   Pt needs to have Cardiac Rehab come see him prior to discharge to the discharge lounge. CR RN has been reached out to and she will be coming shortly to see the pt for education and mobility, then pt can be discharged.   Budd Freiermuth,RN SWOT

## 2023-02-06 NOTE — Telephone Encounter (Signed)
Pharmacy Patient Advocate Encounter  Insurance verification completed.    The patient is insured through [employer]/ [employer]. Patient has Medicare and is not eligible for a copay card, but may be able to apply for patient assistance or Medicare RX Payment Plan (Patient Must reach out to their plan, if eligible for payment plan), if available.    Ran test claim for Marcelline Deist and the current 30 day co-pay is $47.00.   This test claim was processed through [facility]- copay amounts may vary at other pharmacies due to pharmacy/plan contracts, or as the patient moves through the different stages of their insurance plan.

## 2023-02-06 NOTE — Progress Notes (Addendum)
Patient ID: Brendan Holland, male   DOB: [birth year], 73 y.o.   MRN: [id number]     Advanced Heart Failure Rounding Note  Cardiologist: None  Chief Complaint: Anterior MI Subjective:   1/21 S/P  LHC with PCI DES RCA . ST elevations post case. Relook with no changes in anatomy.    Denies chest pain. Denies SOB.  Objective:   Weight Range: 77.3 kg Body mass index is 23.11 kg/m.   Vital Signs:   Temp:  [98 F (36.7 C)-98.9 F (37.2 C)] 98.6 F (37 C) (01/21 0725) Pulse Rate:  [0-81] 62 (01/21 0700) Resp:  [5-22] 15 (01/21 0700) BP: (51-148)/(40-126) 98/65 (01/21 0700) SpO2:  [92 %-100 %] 94 % (01/21 0700) Weight:  [77.3 kg] 77.3 kg (01/20 1130) Last BM Date : 02/04/23  Weight change: Filed Weights   02/05/23 1130  Weight: 77.3 kg    Intake/Output:   Intake/Output Summary (Last 24 hours) at 02/06/2023 0813 Last data filed at 02/06/2023 0550 Gross per 24 hour  Intake 145.79 ml  Output 2275 ml  Net -2129.21 ml      Physical Exam  General:  Well appearing. No resp difficulty HEENT: normal Neck: supple. no JVD. Carotids 2+ bilat; no bruits. No lymphadenopathy or thryomegaly appreciated. Cor: PMI nondisplaced. Regular rate & rhythm. No rubs, gallops or murmurs. Lungs: clear Abdomen: soft, nontender, nondistended. No hepatosplenomegaly. No bruits or masses. Good bowel sounds. Extremities: no cyanosis, clubbing, rash, edema Neuro: alert & orientedx3, cranial nerves grossly intact. moves all 4 extremities w/o difficulty. Affect pleasant  Telemetry   SR with frequent PVCs 20-50s per minute.   Labs    CBC Recent Labs    02/03/23 1440 02/04/23 0932 02/05/23 0353 02/05/23 1505 02/06/23 0259  WBC 11.8*   < > 7.7  --  8.2  NEUTROABS 9.0*  --   --   --   --   HGB 13.4   < > 13.9 13.3 13.6  HCT 37.3*   < > 38.0* 39.0 37.7*  MCV 92.3   < > 92.7  --  92.6  PLT 134*   < > 131*  --  141*   < > = values in this interval not displayed.   Basic Metabolic Panel Recent  Labs    02/05/23 0353 02/05/23 1505 02/05/23 1853 02/06/23 0259  NA 136   < > 135 137  K 3.8   < > 3.8 3.8  CL 104  --  102 104  CO2 23  --  23 25  GLUCOSE 114*  --  120* 129*  BUN 10  --  9 10  CREATININE 1.06  --  1.15 1.10  CALCIUM 8.7*  --  8.5* 8.6*  MG 1.8  --   --   --    < > = values in this interval not displayed.   Liver Function Tests Recent Labs    02/03/23 1440  AST 24  ALT 17  ALKPHOS 48  BILITOT 1.3*  PROT 6.0*  ALBUMIN 3.2*   No results for input(s): "LIPASE", "AMYLASE" in the last 72 hours. Cardiac Enzymes No results for input(s): "CKTOTAL", "CKMB", "CKMBINDEX", "TROPONINI" in the last 72 hours.  BNP: BNP (last 3 results) No results for input(s): "BNP" in the last 8760 hours.  ProBNP (last 3 results) No results for input(s): "PROBNP" in the last 8760 hours.   D-Dimer No results for input(s): "DDIMER" in the last 72 hours. Hemoglobin A1C Recent Labs    02/03/23  1440  HGBA1C 5.3   Fasting Lipid Panel Recent Labs    02/03/23 1440  CHOL 139  HDL 40*  LDLCALC 88  TRIG 53  CHOLHDL 3.5   Thyroid Function Tests No results for input(s): "TSH", "T4TOTAL", "T3FREE", "THYROIDAB" in the last 72 hours.  Invalid input(s): "FREET3"  Other results:   Imaging    CARDIAC CATHETERIZATION Addendum Date: 02/05/2023   2nd RPL lesion is 70% stenosed.   RPAV lesion is 70% stenosed.   Non-stenotic Mid LAD lesion was previously treated.   Non-stenotic Dist RCA lesion was previously treated.   Non-stenotic 1st Mrg lesion was previously treated. 1.  Reassuring relook coronary angiography with continued patency of obtuse marginal and distal right coronary artery stents with no evidence of air embolization or vessel cutoffs.  The previously placed LAD stents are also widely patent. 2.  LVEDP of 10 mmHg. Recommendation: Monitor overnight.  Residual ST elevations prompting relook angiography likely due to air cavitation in coronary microcirculation from air  embolization.  The patient remains asymptomatic.  The results were reviewed with Dr. Gala Romney, the 2 Heart attending.  Result Date: 02/05/2023   2nd RPL lesion is 70% stenosed.   RPAV lesion is 70% stenosed.   Non-stenotic Mid LAD lesion was previously treated.   Non-stenotic Dist RCA lesion was previously treated.   Non-stenotic 1st Mrg lesion was previously treated. 1.  Reassuring relook coronary angiography with continued patency of obtuse marginal and distal right coronary artery stents with no evidence of air embolization or vessel cutoffs.  The previously placed LAD stents are also widely patent. 2.  LVEDP of 10 mmHg. Recommendation: Monitor overnight.  Residual ST elevations prompting relook angiography likely due to air cavitation and microcirculation from air embolization.  The patient remains asymptomatic.  The results were reviewed with Dr. Gala Romney, the 2 Heart attending.   CARDIAC CATHETERIZATION Result Date: 02/05/2023   Dist RCA lesion is 70% stenosed.   1st Mrg lesion is 90% stenosed.   2nd RPL lesion is 70% stenosed.   RPAV lesion is 70% stenosed.   Non-stenotic Mid LAD lesion was previously treated.   A stent was successfully placed.   A stent was successfully placed.   Post intervention, there is a 0% residual stenosis.   Post intervention, there is a 0% residual stenosis. 1.  Widely patent proximal LAD stent. 2.  Successful PCI of first obtuse marginal requiring complex PCI with multiple wires and GuideLiner. 3.  PCI of distal right coronary artery complicated by air embolization treated with atropine and phenylephrine with resolution of shock and bradycardia. Following removal of the sheath, a routine post-procedural EKG demonstrated anterolateral ST elevations.  The patient is asymptomatic.  This likely represents air cavitation in the microcirculation.  A repeat EKG was performed 10 minutes later with no change.  The patient remains asymptomatic.  Will refer for re-look angiography.       Medications:     Scheduled Medications:  aspirin EC  81 mg Oral Daily   atorvastatin  80 mg Oral Daily   Chlorhexidine Gluconate Cloth  6 each Topical Daily   donepezil  10 mg Oral QHS   enoxaparin (LOVENOX) injection  40 mg Subcutaneous Q24H   metoprolol succinate  12.5 mg Oral QHS   ticagrelor  90 mg Oral BID    Infusions:    PRN Medications: acetaminophen, alum & mag hydroxide-simeth, ondansetron (ZOFRAN) IV, mouth rinse   Assessment/Plan   1. CAD: Anterior STEMI with cath showing occluded  mid LAD, 90% stenosis OM1 branch, 70% dRCA.  DES to LAD.   S/P  PCI OM and RCA.  - No chest pain.  - Continue ASA 81 + ticagrelor.  - Atorvastatin 80 daily.  2. Acute systolic CHF: Ischemic cardiomyopathy.  Echo EF 35-40% with mid-apical anteroseptal, apical anterior, apical inferior, and true apex akinesis with no LV thrombus, RV normal. Volume status stable.   - Continue Toprol XL 12.5 mg daily - Start farxiga 10 mg daily - GDMT limited by hypotension.  - Repeat echo at 3 months to assess for recovery.  3. Early onset dementia: Seems mild.  - On donepezil.  4. VT: Patient had VT during PCI on 1/18, amiodarone started.  Now off.  5.PVCs: Post MI PVCs. High PVC burden.  Consider Zio Patch if PVC burden remains high. Continye Toprol XL 12.5 mg at bedtime.   OOB walking.   Length of Stay: 3  Amy Clegg, NP  02/06/2023, 8:13 AM  Advanced Heart Failure Team Pager [phone] (M-F; 7a - 5p)  Please contact [facility] for night-coverage after hours (5p -7a ) and weekends on [email or web address]  Patient seen and examined with the above-signed Advanced Practice Provider and/or Housestaff. I personally reviewed laboratory data, imaging studies and relevant notes. I independently examined the patient and formulated the important aspects of the plan. I have edited the note to reflect any of my changes or salient points. I have personally discussed the plan with the patient and/or  family.  S/p staged PCI/DES to OM and RCA yesterday c/b air embolism from ruptured balloon sheath.   Denies CP or SOB.  General:  Lying in bed No resp difficulty HEENT: normal Neck: supple. no JVD. Carotids 2+ bilat; no bruits. No lymphadenopathy or thryomegaly appreciated. Cor: PMI nondisplaced. Regular Lungs: clear Abdomen: soft, nontender, nondistended. No hepatosplenomegaly. No bruits or masses. Good bowel sounds. Extremities: no cyanosis, clubbing, rash, edema Neuro: alert & oriented cranial nerves grossly intact. moves all 4 extremities w/o difficulty. Affect pleasant  He is stable s/p STEMI with PCI LAD followed by staged PCI  Tele reports frequent PVCs but they were periods of intermittent junctional rhythm. Reviewed with EP.  BP soft  Stable for d/c today on Toprol 12.5 and low-dose spiro.  Can add SGLT2i/ARB as outpatient as BP tolerates.  Refer CR.   Arvilla Meres, MD  10:07 AM

## 2023-02-06 NOTE — Telephone Encounter (Signed)
Left voicemail to return call to office.

## 2023-02-06 NOTE — Progress Notes (Signed)
Cardiac Rehab RN has been in and worked with pt, education completed. Pt tolerated well. Pt ready for discharge now and will get dressed and go down to the discharge lounge.   Annice Needy, RN SWOT

## 2023-02-06 NOTE — Telephone Encounter (Signed)
   Transition of Care Follow-up Phone Call Request    Patient Name: Brendan Holland Date of Birth: Jun 17, 1950 Date of Encounter: 02/06/2023  Primary Care Provider:  Harvie Heck, MD Primary Cardiologist:  None  [name] has been scheduled for a transition of care follow up appointment with a [facility] provider:  02/15/2023 at 2:245pm with Kelby Aline, Np.  Please reach out to [name] within 48 hours of discharge to confirm appointment and review transition of care protocol questionnaire. Anticipated discharge date: 02/06/2023  Corrin Parker, Cordelia Poche  02/06/2023, 9:48 AM

## 2023-02-06 NOTE — Progress Notes (Signed)
CARDIAC REHAB PHASE I   PRE:  Rate/Rhythm: 52SB  BP:  Sitting: 98/57      SaO2: 98 RA   MODE:  Ambulation: 170 ft   POST:  Rate/Rhythm: 66SR  BP:  Sitting: 104/74      SaO2: 98 RA   Pt ambulated independently in hallway tolerating well. No CP, SOB or dizziness. Returned to chair to dress for discharge home. Post MI/stent education including restrictions, risk factors, exercise guidelines, antiplatelet therapy importance, MI booklet, NTG use, heart healthy diet and CRP2 reviewed. All questions and concerns addressed. Will refer to [facility] for CRP2.  Discharge home today.   [phone] Woodroe Chen, RN BSN 02/06/2023 11:38 AM

## 2023-02-06 NOTE — Discharge Summary (Addendum)
Advanced Heart Failure Team  Discharge Summary   Patient ID: Brendan Holland MRN: [id number], DOB/AGE: Jul 31, 1950 73 y.o. Admit date: 02/03/2023 D/C date:     02/06/2023   Primary Discharge Diagnoses:  STEMI Acute HFrEF   Secondary Discharge Diagnoses:  VT Junctional Rhythm Early Onset Dementia   Hospital Course:   Brendan Holland is a 73 year old with a history of HTN, HLD, and early on set dementia admitted with STEMI.   Presented to ED with chest pain. EKG showed acute STEMI. Taken urgently to the cath lab. Given heparin and aspirin. Cath showed showing occluded mid LAD, 90% stenosis OM1 branch, 70% dRCA. He had DES to LAD with staged PCI DES to OM and RCA on Monday. Placed on aspirin, ticagrelor, and high intensity statin.   Echo down to 35-40%. Once stabilized he was started GDMT with bb and MRA. Plan to repeat ECHO in 3 months.  See below for detailed problem list. He has follow up with [facility]. All meds sent [facility].   1. CAD: Anterior STEMI with cath showing occluded mid LAD, 90% stenosis OM1 branch, 70% dRCA.  DES to LAD.  Returned to the lab for PCI DES OM and RCA. No chest pain.  - Continue ASA 81 + ticagrelor + atorvastatin.   At his follow up if he is unable to afford ticagrelor after 30 day free card will need to load plavix.  2. Acute systolic CHF: Ischemic cardiomyopathy.  Echo EF 35-40% with mid-apical anteroseptal, apical anterior, apical inferior, and true apex akinesis with no LV thrombus, RV normal. Volume status stable.  Does not need loop diuretics.  - GDMT  - Continue Toprol XL 12.5 mg daily and Spiro 12.5 mg daily.  - Consider SGLT2 and ARB as an outpatient.  - Repeat echo at 3 months to assess for recovery.  3. Early onset dementia: Seems mild.  - On donepezil.  4. VT: Patient had VT during PCI on 1/18, amiodarone started.  Now off.  5. Junctional Rhythm Initially concerned about  with frequent PVCs but EP reviewed and this was periods of  intermittent junctional rhythm. Amiodarone stopped.   Discharge Vitals: Blood pressure 98/65, pulse 62, temperature 98.6 F (37 C), temperature source Oral, resp. rate 15, weight 77.3 kg, SpO2 94%.  Labs: Lab Results  Component Value Date   WBC 8.2 02/06/2023   HGB 13.6 02/06/2023   HCT 37.7 (L) 02/06/2023   MCV 92.6 02/06/2023   PLT 141 (L) 02/06/2023    Recent Labs  Lab 02/03/23 1440 02/03/23 1956 02/06/23 0259  NA 141   < > 137  K 3.2*   < > 3.8  CL 108   < > 104  CO2 19*   < > 25  BUN 14   < > 10  CREATININE 1.07   < > 1.10  CALCIUM 8.0*   < > 8.6*  PROT 6.0*  --   --   BILITOT 1.3*  --   --   ALKPHOS 48  --   --   ALT 17  --   --   AST 24  --   --   GLUCOSE 131*   < > 129*   < > = values in this interval not displayed.   Lab Results  Component Value Date   CHOL 139 02/03/2023   HDL 40 (L) 02/03/2023   LDLCALC 88 02/03/2023   TRIG 53 02/03/2023   BNP (last 3 results) No results  for input(s): "BNP" in the last 8760 hours.  ProBNP (last 3 results) No results for input(s): "PROBNP" in the last 8760 hours.   Diagnostic Studies/Procedures   CARDIAC CATHETERIZATION Addendum Date: 02/05/2023   2nd RPL lesion is 70% stenosed.   RPAV lesion is 70% stenosed.   Non-stenotic Mid LAD lesion was previously treated.   Non-stenotic Dist RCA lesion was previously treated.   Non-stenotic 1st Mrg lesion was previously treated. 1.  Reassuring relook coronary angiography with continued patency of obtuse marginal and distal right coronary artery stents with no evidence of air embolization or vessel cutoffs.  The previously placed LAD stents are also widely patent. 2.  LVEDP of 10 mmHg. Recommendation: Monitor overnight.  Residual ST elevations prompting relook angiography likely due to air cavitation in coronary microcirculation from air embolization.  The patient remains asymptomatic.  The results were reviewed with Dr. Gala Romney, the 2 Heart attending.  Result Date: 02/05/2023    2nd RPL lesion is 70% stenosed.   RPAV lesion is 70% stenosed.   Non-stenotic Mid LAD lesion was previously treated.   Non-stenotic Dist RCA lesion was previously treated.   Non-stenotic 1st Mrg lesion was previously treated. 1.  Reassuring relook coronary angiography with continued patency of obtuse marginal and distal right coronary artery stents with no evidence of air embolization or vessel cutoffs.  The previously placed LAD stents are also widely patent. 2.  LVEDP of 10 mmHg. Recommendation: Monitor overnight.  Residual ST elevations prompting relook angiography likely due to air cavitation and microcirculation from air embolization.  The patient remains asymptomatic.  The results were reviewed with Dr. Gala Romney, the 2 Heart attending.   CARDIAC CATHETERIZATION Result Date: 02/05/2023   Dist RCA lesion is 70% stenosed.   1st Mrg lesion is 90% stenosed.   2nd RPL lesion is 70% stenosed.   RPAV lesion is 70% stenosed.   Non-stenotic Mid LAD lesion was previously treated.   A stent was successfully placed.   A stent was successfully placed.   Post intervention, there is a 0% residual stenosis.   Post intervention, there is a 0% residual stenosis. 1.  Widely patent proximal LAD stent. 2.  Successful PCI of first obtuse marginal requiring complex PCI with multiple wires and GuideLiner. 3.  PCI of distal right coronary artery complicated by air embolization treated with atropine and phenylephrine with resolution of shock and bradycardia. Following removal of the sheath, a routine post-procedural EKG demonstrated anterolateral ST elevations.  The patient is asymptomatic.  This likely represents air cavitation in the microcirculation.  A repeat EKG was performed 10 minutes later with no change.  The patient remains asymptomatic.  Will refer for re-look angiography.     Discharge Medications   Allergies as of 02/06/2023   No Known Allergies      Medication List     STOP taking these medications     simvastatin 10 MG tablet Commonly known as: ZOCOR       TAKE these medications    aspirin EC 81 MG tablet Take 81 mg by mouth at bedtime.   atorvastatin 80 MG tablet Commonly known as: LIPITOR Take 1 tablet (80 mg total) by mouth daily.   donepezil 10 MG tablet Commonly known as: ARICEPT TAKE 1 TABLET BY MOUTH DAILY What changed: when to take this   FISH OIL PO Take 1 capsule by mouth at bedtime.   GLUCOSAMINE CHONDROITIN COMPLX PO Take 1 tablet by mouth at bedtime.   metoprolol succinate 25  MG 24 hr tablet Commonly known as: TOPROL-XL Take 0.5 tablets (12.5 mg total) by mouth at bedtime.   multivitamin with minerals tablet Take 1 tablet by mouth at bedtime.   omeprazole 40 MG capsule Commonly known as: PRILOSEC Take 40 mg by mouth daily as needed (heartburn, acid reflux).   spironolactone 25 MG tablet Commonly known as: ALDACTONE Take 0.5 tablets (12.5 mg total) by mouth daily.   ticagrelor 90 MG Tabs tablet Commonly known as: BRILINTA Take 1 tablet (90 mg total) by mouth 2 (two) times daily.        Disposition   The patient will be discharged in stable condition to home. Discharge Instructions     AMB Referral to Cardiac Rehabilitation - Phase II   Complete by: As directed    Diagnosis: STEMI   After initial evaluation and assessments completed: Virtual Based Care may be provided alone or in conjunction with Phase 2 Cardiac Rehab based on patient barriers.: Yes   Intensive Cardiac Rehabilitation (ICR) [facility] location only OR Traditional Cardiac Rehabilitation (TCR) *If criteria for ICR are not met will enroll in TCR [facility] only): Yes   AMB Referral to Cardiac Rehabilitation - Phase II   Complete by: As directed    Diagnosis: STEMI   After initial evaluation and assessments completed: Virtual Based Care may be provided alone or in conjunction with Phase 2 Cardiac Rehab based on patient barriers.: Yes   Intensive Cardiac Rehabilitation (ICR) [facility] location  only OR Traditional Cardiac Rehabilitation (TCR) *If criteria for ICR are not met will enroll in TCR [facility] only): Yes   Diet - low sodium heart healthy   Complete by: As directed    Heart Failure patients record your daily weight using the same scale at the same time of day   Complete by: As directed    Increase activity slowly   Complete by: As directed    STOP any activity that causes chest pain, shortness of breath, dizziness, sweating, or exessive weakness   Complete by: As directed        Follow-up Information     Gaston Islam., NP Follow up.   Specialty: Cardiology Why: Hospital follow-up with Cardiology scheduled for 02/15/2023 at 2:45pm. Please arrive 15 minutes early for check-in. If this date/ time does not work for you, please call our office to reschedule. Contact information: [address] [place] [address] [phone]                   Duration of Discharge Encounter: Greater than 35 minutes   Signed, Tonye Becket NP-C  02/06/2023, 9:59 AM Patient seen and examined with the above-signed Advanced Practice Provider and/or Housestaff. I personally reviewed laboratory data, imaging studies and relevant notes. I independently examined the patient and formulated the important aspects of the plan. I have edited the note to reflect any of my changes or salient points. I have personally discussed the plan with the patient and/or family.   S/p staged PCI/DES to OM and RCA yesterday c/b air embolism from ruptured balloon sheath.    Denies CP or SOB.   General:  Lying in bed No resp difficulty HEENT: normal Neck: supple. no JVD. Carotids 2+ bilat; no bruits. No lymphadenopathy or thryomegaly appreciated. Cor: PMI nondisplaced. Regular Lungs: clear Abdomen: soft, nontender, nondistended. No hepatosplenomegaly. No bruits or masses. Good bowel sounds. Extremities: no cyanosis, clubbing, rash, edema Neuro: alert & oriented cranial nerves  grossly intact. moves all 4  extremities w/o difficulty. Affect pleasant   He is stable s/p STEMI with PCI LAD followed by staged PCI   Tele reports frequent PVCs but they were periods of intermittent junctional rhythm. Reviewed with EP.   BP soft   Stable for d/c today on Toprol 12.5 and low-dose spiro.  Can add SGLT2i/ARB as outpatient as BP tolerates.   Refer CR.   My d/c time 40 mins.    Arvilla Meres, MD  10:07 AM

## 2023-02-14 NOTE — Progress Notes (Addendum)
 Cardiology Office Note    Patient Name: Brendan Holland Date of Encounter: 02/15/2023  Primary Care Provider:  Harvie Heck, MD Primary Cardiologist:  [name], MD Primary Electrophysiologist: None   Past Medical History    Past Medical History:  Diagnosis Date   Arthritis    "arthritis right knee"   Cancer [facility])    "skin cancer of scalp" -tx with topical meds-"all clear now"   Dementia (HCC)    "pre Alzheimers" "[facility]"conitive impairment.  Negative on latest testing   DVT of lower extremity (deep venous thrombosis) (HCC)    GERD (gastroesophageal reflux disease)    Headache(784.0)    Heart rate slow    Avid runner" 30 miles per week"   Hepatitis C    Tx. [facility]- 3 yrs ago- "now Clear"   Sleep apnea    no cpap use today"condition improved".  Most recent study neg   Stroke [facility])     History of Present Illness  Brendan Holland is a 73 y.o. male with a PMH of CAD s/p anterior STEMI with cath showing occluded mid LAD, 90% stenosis OM1 branch, 70% dRCA.  DES to LAD.  Returned to the lab for PCI DES OM and RCA  HLD, early onset Alzheimer's, HFrEF, ICM, VT s/p DCCV x 2, OSA DVT who presents today for post PCI follow-up.  Brendan Holland presented to the ED on 02/03/2023 via EMS with complaint of significant chest discomfort EKG in the field revealing anterior STEMI.  He received sublingual nitro but became hypotensive and was started on 4000 units of IV heparin.  2D echo was completed showing decreased EF of 35 as 40% with no RWMA mildly enlarged RV the with mildly dilated RA and trivial MVR with mild aortic dilation of 44 mm.  He underwent LHC that revealed 100% midLAD, 90% OM1, 70% distal RCA.  Patient had mLAD treated with DES x 2 with procedure complicated by VT requiring cardioversion x 2.  He was found to have periods of intermittent junctional rhythm that were resolved with IV amiodarone.  Patient was brought back for staged PCI of OM1 and distal RCA that were successfully  treated and complicated by air embolism from ruptured balloon sheath.   He was placed on Brilinta and ASA along with high intensity statin.  He was also treated with GDMT for decreased EF of 35 as 40% with beta-blocker and MRA with plan to repeat echo in 3 months.  Brendan Holland presents today for post PCI follow-up with his wife. The patient's blood pressure is well-controlled. He has been taking a full tablet of spironolactone, which was initially prescribed as a half tablet. The patient started this medication last Wednesday.  During today's visit we discussed the appropriate GDMT for acute CHF and we will plan to add SGLT2 today.  He has been on aspirin for many years and was started on Brilinta and a higher dose of Lipitor following his heart attack.  He has tolerated all of his prescribed medications and denies any adverse reactions.  He is interested in pursuing cardiac rehab and will be given clearance today.  The patient's spouse reports that the patient's heart rate is typically in the 50s, which is lower than the current rate of 62.    Patient denies chest pain, palpitations, dyspnea, PND, orthopnea, nausea, vomiting, dizziness, syncope, edema, weight gain, or early satiety.   Review of Systems  Please see the history of present illness.    All other systems  reviewed and are otherwise negative except as noted above.  Physical Exam    Wt Readings from Last 3 Encounters:  02/15/23 172 lb (78 kg)  02/05/23 170 lb 6.7 oz (77.3 kg)  12/19/22 168 lb (76.2 kg)   VS: Vitals:   02/15/23 1436  BP: 120/68  Pulse: 62  Resp: 16  SpO2: 98%  ,Body mass index is 23.33 kg/m. GEN: Well nourished, well developed in no acute distress Neck: No JVD; No carotid bruits Pulmonary: Clear to auscultation without rales, wheezing or rhonchi  Cardiovascular: Normal rate. Regular rhythm. Normal S1. Normal S2.   Murmurs: There is no murmur.  ABDOMEN: Soft, non-tender, non-distended EXTREMITIES:  No edema;  No deformity   EKG/LABS/ Recent Cardiac Studies   ECG personally reviewed by me today -sinus rhythm with first-degree AVB and occasional PACs and T WI noted in precordial leads with no acute changes consistent with previous EKG  Risk Assessment/Calculations:          Lab Results  Component Value Date   WBC 8.2 02/06/2023   HGB 13.6 02/06/2023   HCT 37.7 (L) 02/06/2023   MCV 92.6 02/06/2023   PLT 141 (L) 02/06/2023   Lab Results  Component Value Date   CREATININE 1.10 02/06/2023   BUN 10 02/06/2023   NA 137 02/06/2023   K 3.8 02/06/2023   CL 104 02/06/2023   CO2 25 02/06/2023   Lab Results  Component Value Date   CHOL 139 02/03/2023   HDL 40 (L) 02/03/2023   LDLCALC 88 02/03/2023   TRIG 53 02/03/2023   CHOLHDL 3.5 02/03/2023    Lab Results  Component Value Date   HGBA1C 5.3 02/03/2023   Assessment & Plan    1.  Coronary artery disease: -s/p anterior STEMI with cath showing occluded mid LAD, 90% stenosis OM1 branch, 70% dRCA.  DES to LAD.  Returned to the lab for PCI DES OM and RCA  -Today patient reports no chest pain or shortness of breath since his PCI. -He is fine to proceed with cardiac rehab. -He does report some possible cost prohibition with Brilinta and was provided patient assistance paperwork -Continue current GDMT with ASA 81 mg, Toprol 25 mg daily, Brilinta 90 mg twice daily, Lipitor 80 mg daily  2.  HFpEF/ICM: -EF 35-40% with mid-apical anteroseptal, apical anterior, apical inferior, and true apex akinesis with no LV thrombus, RV normal.  -Today patient is euvolemic on examination -We will add Jardiance 10 mg to current GDMT -Continue spironolactone 25 mg daily, Toprol-XL 25 mg -We will plan to repeat 2D echo in 3 months -Check BMET today  3.  NSVT/junctional rhythm: -Patient had VT during initial PCI requiring DCCV x 2 with bouts of intermittent junctional rhythm that resolved with amiodarone. -Today patient is sinus rhythm with first-degree AVB  and occasional PAC  4.  Early onset dementia: -Continue Aricept 10 mg daily  5.  Hyperlipidemia: -Patient's last LDL cholesterol was 88 -Continue atorvastatin 80 mg daily -We will check LFT and lipids in 8 weeks  6. Carotid Bruit: -Right sided bruit auscultated on exam -carotid U/S for evaluation    Cardiac Rehabilitation Eligibility Assessment  The patient is ready to start cardiac rehabilitation from a cardiac standpoint.    Disposition: Follow-up with [name], MD or APP in 3 months    Signed, Napoleon Form, Leodis Rains, NP 02/15/2023, 4:38 PM [facility]

## 2023-02-15 ENCOUNTER — Encounter: Payer: Self-pay | Admitting: Nurse Practitioner

## 2023-02-15 ENCOUNTER — Other Ambulatory Visit (HOSPITAL_COMMUNITY): Payer: Self-pay

## 2023-02-15 ENCOUNTER — Ambulatory Visit: Payer: PPO | Attending: Nurse Practitioner | Admitting: Nurse Practitioner

## 2023-02-15 ENCOUNTER — Other Ambulatory Visit: Payer: Self-pay | Admitting: *Deleted

## 2023-02-15 VITALS — BP 120/68 | HR 62 | Resp 16 | Ht 72.0 in | Wt 172.0 lb

## 2023-02-15 DIAGNOSIS — I2510 Atherosclerotic heart disease of native coronary artery without angina pectoris: Secondary | ICD-10-CM

## 2023-02-15 DIAGNOSIS — I498 Other specified cardiac arrhythmias: Secondary | ICD-10-CM

## 2023-02-15 DIAGNOSIS — I4729 Other ventricular tachycardia: Secondary | ICD-10-CM

## 2023-02-15 DIAGNOSIS — F0280 Dementia in other diseases classified elsewhere without behavioral disturbance: Secondary | ICD-10-CM

## 2023-02-15 DIAGNOSIS — I5020 Unspecified systolic (congestive) heart failure: Secondary | ICD-10-CM

## 2023-02-15 DIAGNOSIS — G300 Alzheimer's disease with early onset: Secondary | ICD-10-CM

## 2023-02-15 DIAGNOSIS — R0989 Other specified symptoms and signs involving the circulatory and respiratory systems: Secondary | ICD-10-CM

## 2023-02-15 MED ORDER — METOPROLOL SUCCINATE ER 25 MG PO TB24: 25.0000 mg | ORAL_TABLET | Freq: Every day | ORAL | 3 refills | Status: DC

## 2023-02-15 MED ORDER — EMPAGLIFLOZIN 10 MG PO TABS: 10.0000 mg | ORAL_TABLET | Freq: Every day | ORAL | 3 refills | Status: DC

## 2023-02-15 NOTE — Patient Instructions (Addendum)
Medication Instructions:  Your physician has recommended you make the following change in your medication:   START Jardiance 10 mg taking 1 daily  INCREASE the Toprol Xl to 25 mg taking 1 daily   *If you need a refill on your cardiac medications before your next appointment, please call your pharmacy*   Lab Work: TODAY:  BMET 04/12/23:  GO TO A [facility] NEAR YOU, MAKE SURE YOU ARE FASTING, FOR:  LIPID & LFT  If you have labs (blood work) drawn today and your tests are completely normal, you will receive your results only by: MyChart Message (if you have MyChart) OR A paper copy in the mail If you have any lab test that is abnormal or we need to change your treatment, we will call you to review the results.   Testing/Procedures: Your physician has requested that you have a carotid duplex. This test is an ultrasound of the carotid arteries in your neck. It looks at blood flow through these arteries that supply the brain with blood. Allow one hour for this exam. There are no restrictions or special instructions.    Your physician has requested that you have an echocardiogram IN 3 MONTHS. Echocardiography is a painless test that uses sound waves to create images of your heart. It provides your doctor with information about the size and shape of your heart and how well your heart's chambers and valves are working. This procedure takes approximately one hour. There are no restrictions for this procedure. Please do NOT wear cologne, perfume, aftershave, or lotions (deodorant is allowed). Please arrive 15 minutes prior to your appointment time.  Please note: We ask at that you not bring children with you during ultrasound (echo/ vascular) testing. Due to room size and safety concerns, children are not allowed in the ultrasound rooms during exams. Our front office staff cannot provide observation of children in our lobby area while testing is being conducted. An adult accompanying a patient to their  appointment will only be allowed in the ultrasound room at the discretion of the ultrasound technician under special circumstances. We apologize for any inconvenience.     Follow-Up: At [facility], you and your health needs are our priority.  As part of our continuing mission to provide you with exceptional heart care, we have created designated Provider Care Teams.  These Care Teams include your primary Cardiologist (physician) and Advanced Practice Providers (APPs -  Physician Assistants and Nurse Practitioners) who all work together to provide you with the care you need, when you need it.  We recommend signing up for the patient portal called "MyChart".  Sign up information is provided on this After Visit Summary.  MyChart is used to connect with patients for Virtual Visits (Telemedicine).  Patients are able to view lab/test results, encounter notes, upcoming appointments, etc.  Non-urgent messages can be sent to your provider as well.   To learn more about what you can do with MyChart, go to [email or web address].    Your next appointment:   3 month(s)  Provider:   [name], MD  or Robin Searing, NP         Other Instructions   1st Floor: - Lobby - Registration  - Pharmacy  - Lab - Cafe  2nd Floor: - PV Lab - Diagnostic Testing (echo, CT, nuclear med)  3rd Floor: - Vacant  4th Floor: - TCTS (cardiothoracic surgery) - AFib Clinic - Structural Heart Clinic - Vascular Surgery  - Vascular Ultrasound  5th Floor: - HeartCare Cardiology (general and EP) - Clinical Pharmacy for coumadin, hypertension, lipid, weight-loss medications, and med management appointments    Valet parking services will be available as well.

## 2023-02-16 LAB — BASIC METABOLIC PANEL
BUN/Creatinine Ratio: 14 (ref 10–24)
BUN: 17 mg/dL (ref 8–27)
CO2: 23 mmol/L (ref 20–29)
Calcium: 9.5 mg/dL (ref 8.6–10.2)
Chloride: 100 mmol/L (ref 96–106)
Creatinine, Ser: 1.2 mg/dL (ref 0.76–1.27)
Glucose: 98 mg/dL (ref 70–99)
Potassium: 4.7 mmol/L (ref 3.5–5.2)
Sodium: 140 mmol/L (ref 134–144)
eGFR: 63 mL/min/{1.73_m2} (ref >59)

## 2023-02-19 ENCOUNTER — Encounter (HOSPITAL_COMMUNITY): Payer: Self-pay

## 2023-02-19 ENCOUNTER — Telehealth (HOSPITAL_COMMUNITY): Payer: Self-pay

## 2023-02-19 ENCOUNTER — Encounter: Payer: Self-pay | Admitting: Pharmacy Technician

## 2023-02-19 ENCOUNTER — Other Ambulatory Visit (HOSPITAL_COMMUNITY): Payer: Self-pay

## 2023-02-19 ENCOUNTER — Telehealth: Payer: Self-pay | Admitting: Pharmacy Technician

## 2023-02-19 NOTE — Telephone Encounter (Signed)
 Attempted to call patient in regards to Cardiac Rehab - LM on VM Mailed letter

## 2023-02-19 NOTE — Telephone Encounter (Signed)
PAP: Patient assistance application for Brilinta through [employer] ([employer]) has been mailed to pt's home address on file. Provider portion of application will be faxed to provider's office.

## 2023-02-20 ENCOUNTER — Telehealth (HOSPITAL_COMMUNITY): Payer: Self-pay

## 2023-02-20 NOTE — Telephone Encounter (Signed)
 Pt insurance is active and benefits verified through [employer]. Co-pay $15.00, DED $0.00/$0.00 met, out of pocket $3,400.00/$0.00 met, co-insurance 0%. No pre-authorization required. Ericka/[employer], 02/20/23 @ 9:17AM, MZQ#[id number]   How many CR sessions are covered? (36 visits for TCR, 72 visits for ICR)72 Is this a lifetime maximum or an annual maximum? Annual Has the member used any of these services to date? No Is there a time limit (weeks/months) on start of program and/or program completion? No   2ndary insurance is active and benefits verified through [employer]. Co-pay $35.00, DED $0.00/$0.00 met, out of pocket $7,500.00/$2.25 met, co-insurance 0%. No pre-authorization required. [employer], 02/20/23 @ 9:17AM, REF#[id number]

## 2023-02-20 NOTE — Telephone Encounter (Signed)
Returned pt phone call in regards to CR, pt stated he is interested. Patient will come in for orientation on 02/23/23 @ 8AM and will attend the 10:15AM exercise class. Went over insurance, patient verbalized understanding.   Pensions consultant.

## 2023-02-22 ENCOUNTER — Telehealth (HOSPITAL_COMMUNITY): Payer: Self-pay

## 2023-02-22 ENCOUNTER — Encounter: Payer: Self-pay | Admitting: Orthopaedic Surgery

## 2023-02-22 NOTE — Telephone Encounter (Signed)
  Called pt to confirm appt for 02/23/23 at 0800. Gave pt instructions for appt, what to wear, office address, eating/taking meds before, and if sick to call and reschedule. Pt voiced understanding, all questions answered.   Health history completed? Yes   Con KATHEE Pereyra, MS, ACSM-CEP 02/22/2023 11:08 AM

## 2023-02-23 ENCOUNTER — Encounter (HOSPITAL_COMMUNITY)
Admission: RE | Admit: 2023-02-23 | Discharge: 2023-02-23 | Disposition: A | Discharge status: Home / Self Care | Payer: PPO | Source: Ambulatory Visit | Attending: Internal Medicine | Admitting: Internal Medicine

## 2023-02-23 VITALS — BP 122/64 | HR 65 | Ht 72.0 in | Wt 171.1 lb

## 2023-02-23 DIAGNOSIS — I252 Old myocardial infarction: Secondary | ICD-10-CM | POA: Insufficient documentation

## 2023-02-23 DIAGNOSIS — Z48812 Encounter for surgical aftercare following surgery on the circulatory system: Secondary | ICD-10-CM | POA: Insufficient documentation

## 2023-02-23 DIAGNOSIS — Z87891 Personal history of nicotine dependence: Secondary | ICD-10-CM | POA: Insufficient documentation

## 2023-02-23 DIAGNOSIS — Z955 Presence of coronary angioplasty implant and graft: Secondary | ICD-10-CM | POA: Diagnosis not present

## 2023-02-23 DIAGNOSIS — I2102 ST elevation (STEMI) myocardial infarction involving left anterior descending coronary artery: Secondary | ICD-10-CM

## 2023-02-23 NOTE — Progress Notes (Signed)
 Cardiac Rehab Medication Review   Does the patient  feel that his/her medications are working for him/her?  YES  Has the patient been experiencing any side effects to the medications prescribed?  NO  Does the patient measure his/her own blood pressure or blood glucose at home?  YES   Does the patient have any problems obtaining medications due to transportation or finances?  NO  Understanding of regimen: good Understanding of indications: excellent Potential of compliance: excellent    Comments: Brendan Holland has a good understanding of his medications and regime. He checks his BP, SpO2, and weights daily.     Brendan KATHEE Pereyra, MS, ACSM-CEP 02/23/2023 8:39 AM

## 2023-02-23 NOTE — Progress Notes (Signed)
 Cardiac Individual Treatment Plan  Patient Details  Name: Brendan Holland MRN: [id number] Date of Birth: [birth year] Referring Provider:   Flowsheet Row INTENSIVE CARDIAC REHAB ORIENT from 02/23/2023 in [facility]  Referring Provider Lurena Red, MD       Initial Encounter Date:  Flowsheet Row INTENSIVE CARDIAC REHAB ORIENT from 02/23/2023 in [facility]  Date 02/23/23       Visit Diagnosis: 02/03/23 DES LAD  02/03/23 STEMI  02/05/23 DES X2  Patient's Home Medications on Admission:  Current Outpatient Medications:    aspirin  EC 81 MG tablet, Take 81 mg by mouth at bedtime., Disp: , Rfl:    atorvastatin  (LIPITOR) 80 MG tablet, Take 1 tablet (80 mg total) by mouth daily., Disp: 30 tablet, Rfl: 6   donepezil  (ARICEPT ) 10 MG tablet, TAKE 1 TABLET BY MOUTH DAILY (Patient taking differently: Take 10 mg by mouth at bedtime.), Disp: 90 tablet, Rfl: 2   empagliflozin  (JARDIANCE ) 10 MG TABS tablet, Take 1 tablet (10 mg total) by mouth daily before breakfast., Disp: 90 tablet, Rfl: 3   GLUCOSAMINE CHONDROITIN COMPLX PO, Take 1 tablet by mouth at bedtime., Disp: , Rfl:    metoprolol  succinate (TOPROL  XL) 25 MG 24 hr tablet, Take 1 tablet (25 mg total) by mouth daily., Disp: 90 tablet, Rfl: 3   Multiple Vitamins-Minerals (MULTIVITAMIN WITH MINERALS) tablet, Take 1 tablet by mouth at bedtime., Disp: , Rfl:    Omega-3 Fatty Acids (FISH OIL PO), Take 1 capsule by mouth at bedtime., Disp: , Rfl:    pantoprazole  (PROTONIX ) 40 MG tablet, Take 1 tablet (40 mg total) by mouth daily., Disp: 30 tablet, Rfl: 6   spironolactone  (ALDACTONE ) 25 MG tablet, Take 0.5 tablets (12.5 mg total) by mouth daily. (Patient taking differently: Take 25 mg by mouth daily.), Disp: 15 tablet, Rfl: 6   ticagrelor  (BRILINTA ) 90 MG TABS tablet, Take 1 tablet (90 mg total) by mouth 2 (two) times daily., Disp: 60  tablet, Rfl: 6   traZODone (DESYREL) 50 MG tablet, Take 50 mg by mouth at bedtime. (Patient taking differently: Take 25 mg by mouth at bedtime.), Disp: , Rfl:   Past Medical History: Past Medical History:  Diagnosis Date   Arthritis    arthritis right knee   Cancer (HCC)    skin cancer of scalp -tx with topical meds-all clear now   Dementia (HCC)    pre Alzheimers MCIconitive impairment.  Negative on latest testing   DVT of lower extremity (deep venous thrombosis) (HCC)    GERD (gastroesophageal reflux disease)    Headache(784.0)    Heart rate slow    Avid runner 30 miles per week   Hepatitis C    Tx. [facility]- 3 yrs ago- now Clear   Sleep apnea    no cpap use todaycondition improved.  Most recent study neg   Stroke (HCC)     Tobacco Use: Social History   Tobacco Use  Smoking Status Former   Current packs/day: 0.00   Types: Cigarettes   Quit date: 12/20/1982   Years since quitting: 40.2  Smokeless Tobacco Never    Labs: Review Flowsheet       Latest Ref Rng & Units 02/03/2023 02/05/2023  Labs for ITP Cardiac and Pulmonary Rehab  Cholestrol 0 - 200 mg/dL 860  -  LDL (calc) 0 - 99 mg/dL 88  -  HDL-C >59 mg/dL 40  -  Trlycerides <150 mg/dL 53  -  Hemoglobin J8r 4.8 - 5.6 % 5.3  -  PH, Arterial 7.35 - 7.45 - 7.271   PCO2 arterial 32 - 48 mmHg - 41.2   Bicarbonate 20.0 - 28.0 mmol/L - 19.0   TCO2 22 - 32 mmol/L - 20   Acid-base deficit 0.0 - 2.0 mmol/L - 8.0   O2 Saturation % - 96     Capillary Blood Glucose: No results found for: GLUCAP   Exercise Target Goals: Exercise Program Goal: Individual exercise prescription set using results from initial 6 min walk test and THRR while considering  patient's activity barriers and safety.   Exercise Prescription Goal: Initial exercise prescription builds to 30-45 minutes a day of aerobic activity, 2-3 days per week.  Home exercise guidelines will be given to patient during program as part of exercise  prescription that the participant will acknowledge.  Activity Barriers & Risk Stratification:  Activity Barriers & Cardiac Risk Stratification - 02/23/23 0841       Activity Barriers & Cardiac Risk Stratification   Activity Barriers Arthritis;Right Knee Replacement;Decreased Ventricular Function    Cardiac Risk Stratification High   <5 METs on            6 Minute Walk:  6 Minute Walk     Row Name 02/23/23 0958         6 Minute Walk   Phase Initial     Distance 1200 feet     Walk Time 6 minutes     # of Rest Breaks 0     MPH 2.27     METS 2.67     RPE 10     Perceived Dyspnea  0     VO2 Peak 9.35     Symptoms No     Resting HR 64 bpm     Resting BP 122/64     Resting Oxygen Saturation  98 %     Exercise Oxygen Saturation  during 6 min walk 100 %     Max Ex. HR 75 bpm     Max Ex. BP 138/72     2 Minute Post BP 118/68              Oxygen Initial Assessment:   Oxygen Re-Evaluation:   Oxygen Discharge (Final Oxygen Re-Evaluation):   Initial Exercise Prescription:  Initial Exercise Prescription - 02/23/23 0900       Date of Initial Exercise RX and Referring Provider   Date 02/23/23    Referring Provider Lurena Red, MD    Expected Discharge Date 05/16/23      Treadmill   MPH 2    Grade 0    Minutes 15    METs 2.5      NuStep   Level 2    SPM 60    Minutes 15    METs 2.5      Prescription Details   Frequency (times per week) 3    Duration Progress to 30 minutes of continuous aerobic without signs/symptoms of physical distress      Intensity   THRR 40-80% of Max Heartrate 58-117    Ratings of Perceived Exertion 11-13    Perceived Dyspnea 0-4      Progression   Progression Continue progressive overload as per policy without signs/symptoms or physical distress.      Resistance Training   Training Prescription Yes    Weight 4    Reps 10-15  Perform Capillary Blood Glucose checks as needed.  Exercise  Prescription Changes:   Exercise Comments:   Exercise Goals and Review:   Exercise Goals     Row Name 02/23/23 0841             Exercise Goals   Increase Physical Activity Yes       Intervention Provide advice, education, support and counseling about physical activity/exercise needs.;Develop an individualized exercise prescription for aerobic and resistive training based on initial evaluation findings, risk stratification, comorbidities and participant's personal goals.       Expected Outcomes Short Term: Attend rehab on a regular basis to increase amount of physical activity.;Long Term: Exercising regularly at least 3-5 days a week.;Long Term: Add in home exercise to make exercise part of routine and to increase amount of physical activity.       Increase Strength and Stamina Yes       Intervention Provide advice, education, support and counseling about physical activity/exercise needs.;Develop an individualized exercise prescription for aerobic and resistive training based on initial evaluation findings, risk stratification, comorbidities and participant's personal goals.       Expected Outcomes Short Term: Increase workloads from initial exercise prescription for resistance, speed, and METs.;Short Term: Perform resistance training exercises routinely during rehab and add in resistance training at home;Long Term: Improve cardiorespiratory fitness, muscular endurance and strength as measured by increased METs and functional capacity ( )       Able to understand and use rate of perceived exertion (RPE) scale Yes       Intervention Provide education and explanation on how to use RPE scale       Expected Outcomes Short Term: Able to use RPE daily in rehab to express subjective intensity level;Long Term:  Able to use RPE to guide intensity level when exercising independently       Knowledge and understanding of Target Heart Rate Range (THRR) Yes       Intervention Provide education and  explanation of THRR including how the numbers were predicted and where they are located for reference       Expected Outcomes Short Term: Able to state/look up THRR;Short Term: Able to use daily as guideline for intensity in rehab;Long Term: Able to use THRR to govern intensity when exercising independently       Understanding of Exercise Prescription Yes       Intervention Provide education, explanation, and written materials on patient's individual exercise prescription       Expected Outcomes Short Term: Able to explain program exercise prescription;Long Term: Able to explain home exercise prescription to exercise independently                Exercise Goals Re-Evaluation :   Discharge Exercise Prescription (Final Exercise Prescription Changes):   Nutrition:  Target Goals: Understanding of nutrition guidelines, daily intake of sodium 1500mg , cholesterol 200mg , calories 30% from fat and 7% or less from saturated fats, daily to have 5 or more servings of fruits and vegetables.  Biometrics:  Pre Biometrics - 02/23/23 0835       Pre Biometrics   Waist Circumference 34 inches    Hip Circumference 37 inches    Waist to Hip Ratio 0.92 %    Triceps Skinfold 10 mm    % Body Fat 42 %    Grip Strength 42 kg    Flexibility 15 in    Single Leg Stand 17.06 seconds  Nutrition Therapy Plan and Nutrition Goals:   Nutrition Assessments:  MEDIFICTS Score Key: >=70 Need to make dietary changes  40-70 Heart Healthy Diet <= 40 Therapeutic Level Cholesterol Diet    Picture Your Plate Scores: <59 Unhealthy dietary pattern with much room for improvement. 41-50 Dietary pattern unlikely to meet recommendations for good health and room for improvement. 51-60 More healthful dietary pattern, with some room for improvement.  >60 Healthy dietary pattern, although there may be some specific behaviors that could be improved.    Nutrition Goals Re-Evaluation:   Nutrition  Goals Re-Evaluation:   Nutrition Goals Discharge (Final Nutrition Goals Re-Evaluation):   Psychosocial: Target Goals: Acknowledge presence or absence of significant depression and/or stress, maximize coping skills, provide positive support system. Participant is able to verbalize types and ability to use techniques and skills needed for reducing stress and depression.  Initial Review & Psychosocial Screening:  Initial Psych Review & Screening - 02/23/23 0845       Initial Review   Current issues with None Identified      Family Dynamics   Good Support System? Yes   Wife for support   Comments Madeleine denies any feelings of depression/stress/anxiety. However, he shared that he has some days when he feels down because he is unable to participate in former activities and is starting to get bored at home. Madeleine is eager to exercise and begin CRP2.      Barriers   Psychosocial barriers to participate in program There are no identifiable barriers or psychosocial needs.      Screening Interventions   Interventions Encouraged to exercise;Provide feedback about the scores to participant    Expected Outcomes Long Term goal: The participant improves quality of Life and PHQ9 Scores as seen by post scores and/or verbalization of changes;Short Term goal: Identification and review with participant of any Quality of Life or Depression concerns found by scoring the questionnaire.             Quality of Life Scores:  Quality of Life - 02/23/23 1012       Quality of Life   Select Quality of Life      Quality of Life Scores   Health/Function Pre 27.47 %    Socioeconomic Pre 28 %    Psych/Spiritual Pre 30 %    Family Pre 28.8 %    GLOBAL Pre 28.25 %            Scores of 19 and below usually indicate a poorer quality of life in these areas.  A difference of  2-3 points is a clinically meaningful difference.  A difference of 2-3 points in the total score of the Quality of Life Index has  been associated with significant improvement in overall quality of life, self-image, physical symptoms, and general health in studies assessing change in quality of life.  PHQ-9: Review Flowsheet       02/23/2023  Depression screen PHQ 2/9  Decreased Interest 1  Down, Depressed, Hopeless 1  PHQ - 2 Score 2  Altered sleeping 0  Tired, decreased energy 1  Change in appetite 0  Feeling bad or failure about yourself  0  Trouble concentrating 0  Moving slowly or fidgety/restless 0  Suicidal thoughts 0  PHQ-9 Score 3  Difficult doing work/chores Not difficult at all   Interpretation of Total Score  Total Score Depression Severity:  1-4 = Minimal depression, 5-9 = Mild depression, 10-14 = Moderate depression, 15-19 = Moderately severe depression, 20-27 =  Severe depression   Psychosocial Evaluation and Intervention:   Psychosocial Re-Evaluation:   Psychosocial Discharge (Final Psychosocial Re-Evaluation):   Vocational Rehabilitation: Provide vocational rehab assistance to qualifying candidates.   Vocational Rehab Evaluation & Intervention:  Vocational Rehab - 02/23/23 0844       Initial Vocational Rehab Evaluation & Intervention   Assessment shows need for Vocational Rehabilitation No   Madeleine is retired            Education: Education Goals: Education classes will be provided on a weekly basis, covering required topics. Participant will state understanding/return demonstration of topics presented.     Core Videos: Exercise    Move It!  Clinical staff conducted group or individual video education with verbal and written material and guidebook.  Patient learns the recommended Pritikin exercise program. Exercise with the goal of living a long, healthy life. Some of the health benefits of exercise include controlled diabetes, healthier blood pressure levels, improved cholesterol levels, improved heart and lung capacity, improved sleep, and better body composition.  Everyone should speak with their doctor before starting or changing an exercise routine.  Biomechanical Limitations Clinical staff conducted group or individual video education with verbal and written material and guidebook.  Patient learns how biomechanical limitations can impact exercise and how we can mitigate and possibly overcome limitations to have an impactful and balanced exercise routine.  Body Composition Clinical staff conducted group or individual video education with verbal and written material and guidebook.  Patient learns that body composition (ratio of muscle mass to fat mass) is a key component to assessing overall fitness, rather than body weight alone. Increased fat mass, especially visceral belly fat, can put us  at increased risk for metabolic syndrome, type 2 diabetes, heart disease, and even death. It is recommended to combine diet and exercise (cardiovascular and resistance training) to improve your body composition. Seek guidance from your physician and exercise physiologist before implementing an exercise routine.  Exercise Action Plan Clinical staff conducted group or individual video education with verbal and written material and guidebook.  Patient learns the recommended strategies to achieve and enjoy long-term exercise adherence, including variety, self-motivation, self-efficacy, and positive decision making. Benefits of exercise include fitness, good health, weight management, more energy, better sleep, less stress, and overall well-being.  Medical   Heart Disease Risk Reduction Clinical staff conducted group or individual video education with verbal and written material and guidebook.  Patient learns our heart is our most vital organ as it circulates oxygen, nutrients, white blood cells, and hormones throughout the entire body, and carries waste away. Data supports a plant-based eating plan like the Pritikin Program for its effectiveness in slowing progression of and  reversing heart disease. The video provides a number of recommendations to address heart disease.   Metabolic Syndrome and Belly Fat  Clinical staff conducted group or individual video education with verbal and written material and guidebook.  Patient learns what metabolic syndrome is, how it leads to heart disease, and how one can reverse it and keep it from coming back. You have metabolic syndrome if you have 3 of the following 5 criteria: abdominal obesity, high blood pressure, high triglycerides, low HDL cholesterol, and high blood sugar.  Hypertension and Heart Disease Clinical staff conducted group or individual video education with verbal and written material and guidebook.  Patient learns that high blood pressure, or hypertension, is very common in the [place] . Hypertension is largely due to excessive salt intake, but other important risk factors include  being overweight, physical inactivity, drinking too much alcohol, smoking, and not eating enough potassium from fruits and vegetables. High blood pressure is a leading risk factor for heart attack, stroke, congestive heart failure, dementia, kidney failure, and premature death. Long-term effects of excessive salt intake include stiffening of the arteries and thickening of heart muscle and organ damage. Recommendations include ways to reduce hypertension and the risk of heart disease.  Diseases of Our Time - Focusing on Diabetes Clinical staff conducted group or individual video education with verbal and written material and guidebook.  Patient learns why the best way to stop diseases of our time is prevention, through food and other lifestyle changes. Medicine (such as prescription pills and surgeries) is often only a Band-Aid on the problem, not a long-term solution. Most common diseases of our time include obesity, type 2 diabetes, hypertension, heart disease, and cancer. The Pritikin Program is recommended and has been proven to help  reduce, reverse, and/or prevent the damaging effects of metabolic syndrome.  Nutrition   Overview of the Pritikin Eating Plan  Clinical staff conducted group or individual video education with verbal and written material and guidebook.  Patient learns about the Pritikin Eating Plan for disease risk reduction. The Pritikin Eating Plan emphasizes a wide variety of unrefined, minimally-processed carbohydrates, like fruits, vegetables, whole grains, and legumes. Go, Caution, and Stop food choices are explained. Plant-based and lean animal proteins are emphasized. Rationale provided for low sodium intake for blood pressure control, low added sugars for blood sugar stabilization, and low added fats and oils for coronary artery disease risk reduction and weight management.  Calorie Density  Clinical staff conducted group or individual video education with verbal and written material and guidebook.  Patient learns about calorie density and how it impacts the Pritikin Eating Plan. Knowing the characteristics of the food you choose will help you decide whether those foods will lead to weight gain or weight loss, and whether you want to consume more or less of them. Weight loss is usually a side effect of the Pritikin Eating Plan because of its focus on low calorie-dense foods.  Label Reading  Clinical staff conducted group or individual video education with verbal and written material and guidebook.  Patient learns about the Pritikin recommended label reading guidelines and corresponding recommendations regarding calorie density, added sugars, sodium content, and whole grains.  Dining Out - Part 1  Clinical staff conducted group or individual video education with verbal and written material and guidebook.  Patient learns that restaurant meals can be sabotaging because they can be so high in calories, fat, sodium, and/or sugar. Patient learns recommended strategies on how to positively address this and avoid  unhealthy pitfalls.  Facts on Fats  Clinical staff conducted group or individual video education with verbal and written material and guidebook.  Patient learns that lifestyle modifications can be just as effective, if not more so, as many medications for lowering your risk of heart disease. A Pritikin lifestyle can help to reduce your risk of inflammation and atherosclerosis (cholesterol build-up, or plaque, in the artery walls). Lifestyle interventions such as dietary choices and physical activity address the cause of atherosclerosis. A review of the types of fats and their impact on blood cholesterol levels, along with dietary recommendations to reduce fat intake is also included.  Nutrition Action Plan  Clinical staff conducted group or individual video education with verbal and written material and guidebook.  Patient learns how to incorporate Pritikin recommendations into their lifestyle. Recommendations  include planning and keeping personal health goals in mind as an important part of their success.  Healthy Mind-Set    Healthy Minds, Bodies, Hearts  Clinical staff conducted group or individual video education with verbal and written material and guidebook.  Patient learns how to identify when they are stressed. Video will discuss the impact of that stress, as well as the many benefits of stress management. Patient will also be introduced to stress management techniques. The way we think, act, and feel has an impact on our hearts.  How Our Thoughts Can Heal Our Hearts  Clinical staff conducted group or individual video education with verbal and written material and guidebook.  Patient learns that negative thoughts can cause depression and anxiety. This can result in negative lifestyle behavior and serious health problems. Cognitive behavioral therapy is an effective method to help control our thoughts in order to change and improve our emotional outlook.  Additional Videos:  Exercise     Improving Performance  Clinical staff conducted group or individual video education with verbal and written material and guidebook.  Patient learns to use a non-linear approach by alternating intensity levels and lengths of time spent exercising to help burn more calories and lose more body fat. Cardiovascular exercise helps improve heart health, metabolism, hormonal balance, blood sugar control, and recovery from fatigue. Resistance training improves strength, endurance, balance, coordination, reaction time, metabolism, and muscle mass. Flexibility exercise improves circulation, posture, and balance. Seek guidance from your physician and exercise physiologist before implementing an exercise routine and learn your capabilities and proper form for all exercise.  Introduction to Yoga  Clinical staff conducted group or individual video education with verbal and written material and guidebook.  Patient learns about yoga, a discipline of the coming together of mind, breath, and body. The benefits of yoga include improved flexibility, improved range of motion, better posture and core strength, increased lung function, weight loss, and positive self-image. Yoga's heart health benefits include lowered blood pressure, healthier heart rate, decreased cholesterol and triglyceride levels, improved immune function, and reduced stress. Seek guidance from your physician and exercise physiologist before implementing an exercise routine and learn your capabilities and proper form for all exercise.  Medical   Aging: Enhancing Your Quality of Life  Clinical staff conducted group or individual video education with verbal and written material and guidebook.  Patient learns key strategies and recommendations to stay in good physical health and enhance quality of life, such as prevention strategies, having an advocate, securing a Health Care Proxy and Power of Attorney, and keeping a list of medications and system for  tracking them. It also discusses how to avoid risk for bone loss.  Biology of Weight Control  Clinical staff conducted group or individual video education with verbal and written material and guidebook.  Patient learns that weight gain occurs because we consume more calories than we burn (eating more, moving less). Even if your body weight is normal, you may have higher ratios of fat compared to muscle mass. Too much body fat puts you at increased risk for cardiovascular disease, heart attack, stroke, type 2 diabetes, and obesity-related cancers. In addition to exercise, following the Pritikin Eating Plan can help reduce your risk.  Decoding Lab Results  Clinical staff conducted group or individual video education with verbal and written material and guidebook.  Patient learns that lab test reflects one measurement whose values change over time and are influenced by many factors, including medication, stress, sleep, exercise, food, hydration, pre-existing  medical conditions, and more. It is recommended to use the knowledge from this video to become more involved with your lab results and evaluate your numbers to speak with your doctor.   Diseases of Our Time - Overview  Clinical staff conducted group or individual video education with verbal and written material and guidebook.  Patient learns that according to the CDC, 50% to 70% of chronic diseases (such as obesity, type 2 diabetes, elevated lipids, hypertension, and heart disease) are avoidable through lifestyle improvements including healthier food choices, listening to satiety cues, and increased physical activity.  Sleep Disorders Clinical staff conducted group or individual video education with verbal and written material and guidebook.  Patient learns how good quality and duration of sleep are important to overall health and well-being. Patient also learns about sleep disorders and how they impact health along with recommendations to address  them, including discussing with a physician.  Nutrition  Dining Out - Part 2 Clinical staff conducted group or individual video education with verbal and written material and guidebook.  Patient learns how to plan ahead and communicate in order to maximize their dining experience in a healthy and nutritious manner. Included are recommended food choices based on the type of restaurant the patient is visiting.   Fueling a Banker conducted group or individual video education with verbal and written material and guidebook.  There is a strong connection between our food choices and our health. Diseases like obesity and type 2 diabetes are very prevalent and are in large-part due to lifestyle choices. The Pritikin Eating Plan provides plenty of food and hunger-curbing satisfaction. It is easy to follow, affordable, and helps reduce health risks.  Menu Workshop  Clinical staff conducted group or individual video education with verbal and written material and guidebook.  Patient learns that restaurant meals can sabotage health goals because they are often packed with calories, fat, sodium, and sugar. Recommendations include strategies to plan ahead and to communicate with the manager, chef, or server to help order a healthier meal.  Planning Your Eating Strategy  Clinical staff conducted group or individual video education with verbal and written material and guidebook.  Patient learns about the Pritikin Eating Plan and its benefit of reducing the risk of disease. The Pritikin Eating Plan does not focus on calories. Instead, it emphasizes high-quality, nutrient-rich foods. By knowing the characteristics of the foods, we choose, we can determine their calorie density and make informed decisions.  Targeting Your Nutrition Priorities  Clinical staff conducted group or individual video education with verbal and written material and guidebook.  Patient learns that lifestyle habits have  a tremendous impact on disease risk and progression. This video provides eating and physical activity recommendations based on your personal health goals, such as reducing LDL cholesterol, losing weight, preventing or controlling type 2 diabetes, and reducing high blood pressure.  Vitamins and Minerals  Clinical staff conducted group or individual video education with verbal and written material and guidebook.  Patient learns different ways to obtain key vitamins and minerals, including through a recommended healthy diet. It is important to discuss all supplements you take with your doctor.   Healthy Mind-Set    Smoking Cessation  Clinical staff conducted group or individual video education with verbal and written material and guidebook.  Patient learns that cigarette smoking and tobacco addiction pose a serious health risk which affects millions of people. Stopping smoking will significantly reduce the risk of heart disease, lung disease, and many  forms of cancer. Recommended strategies for quitting are covered, including working with your doctor to develop a successful plan.  Culinary   Becoming a Set Designer conducted group or individual video education with verbal and written material and guidebook.  Patient learns that cooking at home can be healthy, cost-effective, quick, and puts them in control. Keys to cooking healthy recipes will include looking at your recipe, assessing your equipment needs, planning ahead, making it simple, choosing cost-effective seasonal ingredients, and limiting the use of added fats, salts, and sugars.  Cooking - Breakfast and Snacks  Clinical staff conducted group or individual video education with verbal and written material and guidebook.  Patient learns how important breakfast is to satiety and nutrition through the entire day. Recommendations include key foods to eat during breakfast to help stabilize blood sugar levels and to prevent  overeating at meals later in the day. Planning ahead is also a key component.  Cooking - Educational Psychologist conducted group or individual video education with verbal and written material and guidebook.  Patient learns eating strategies to improve overall health, including an approach to cook more at home. Recommendations include thinking of animal protein as a side on your plate rather than center stage and focusing instead on lower calorie dense options like vegetables, fruits, whole grains, and plant-based proteins, such as beans. Making sauces in large quantities to freeze for later and leaving the skin on your vegetables are also recommended to maximize your experience.  Cooking - Healthy Salads and Dressing Clinical staff conducted group or individual video education with verbal and written material and guidebook.  Patient learns that vegetables, fruits, whole grains, and legumes are the foundations of the Pritikin Eating Plan. Recommendations include how to incorporate each of these in flavorful and healthy salads, and how to create homemade salad dressings. Proper handling of ingredients is also covered. Cooking - Soups and [employer] - Soups and Desserts Clinical staff conducted group or individual video education with verbal and written material and guidebook.  Patient learns that Pritikin soups and desserts make for easy, nutritious, and delicious snacks and meal components that are low in sodium, fat, sugar, and calorie density, while high in vitamins, minerals, and filling fiber. Recommendations include simple and healthy ideas for soups and desserts.   Overview     The Pritikin Solution Program Overview Clinical staff conducted group or individual video education with verbal and written material and guidebook.  Patient learns that the results of the Pritikin Program have been documented in more than 100 articles published in peer-reviewed journals, and the benefits  include reducing risk factors for (and, in some cases, even reversing) high cholesterol, high blood pressure, type 2 diabetes, obesity, and more! An overview of the three key pillars of the Pritikin Program will be covered: eating well, doing regular exercise, and having a healthy mind-set.  WORKSHOPS  Exercise: Exercise Basics: Building Your Action Plan Clinical staff led group instruction and group discussion with PowerPoint presentation and patient guidebook. To enhance the learning environment the use of posters, models and videos may be added. At the conclusion of this workshop, patients will comprehend the difference between physical activity and exercise, as well as the benefits of incorporating both, into their routine. Patients will understand the FITT (Frequency, Intensity, Time, and Type) principle and how to use it to build an exercise action plan. In addition, safety concerns and other considerations for exercise and cardiac rehab will be  addressed by the presenter. The purpose of this lesson is to promote a comprehensive and effective weekly exercise routine in order to improve patients' overall level of fitness.   Managing Heart Disease: Your Path to a Healthier Heart Clinical staff led group instruction and group discussion with PowerPoint presentation and patient guidebook. To enhance the learning environment the use of posters, models and videos may be added.At the conclusion of this workshop, patients will understand the anatomy and physiology of the heart. Additionally, they will understand how Pritikin's three pillars impact the risk factors, the progression, and the management of heart disease.  The purpose of this lesson is to provide a high-level overview of the heart, heart disease, and how the Pritikin lifestyle positively impacts risk factors.  Exercise Biomechanics Clinical staff led group instruction and group discussion with PowerPoint presentation and patient  guidebook. To enhance the learning environment the use of posters, models and videos may be added. Patients will learn how the structural parts of their bodies function and how these functions impact their daily activities, movement, and exercise. Patients will learn how to promote a neutral spine, learn how to manage pain, and identify ways to improve their physical movement in order to promote healthy living. The purpose of this lesson is to expose patients to common physical limitations that impact physical activity. Participants will learn practical ways to adapt and manage aches and pains, and to minimize their effect on regular exercise. Patients will learn how to maintain good posture while sitting, walking, and lifting.  Balance Training and Fall Prevention  Clinical staff led group instruction and group discussion with PowerPoint presentation and patient guidebook. To enhance the learning environment the use of posters, models and videos may be added. At the conclusion of this workshop, patients will understand the importance of their sensorimotor skills (vision, proprioception, and the vestibular system) in maintaining their ability to balance as they age. Patients will apply a variety of balancing exercises that are appropriate for their current level of function. Patients will understand the common causes for poor balance, possible solutions to these problems, and ways to modify their physical environment in order to minimize their fall risk. The purpose of this lesson is to teach patients about the importance of maintaining balance as they age and ways to minimize their risk of falling.  WORKSHOPS   Nutrition:  Fueling a Ship Broker led group instruction and group discussion with PowerPoint presentation and patient guidebook. To enhance the learning environment the use of posters, models and videos may be added. Patients will review the foundational principles of the  Pritikin Eating Plan and understand what constitutes a serving size in each of the food groups. Patients will also learn Pritikin-friendly foods that are better choices when away from home and review make-ahead meal and snack options. Calorie density will be reviewed and applied to three nutrition priorities: weight maintenance, weight loss, and weight gain. The purpose of this lesson is to reinforce (in a group setting) the key concepts around what patients are recommended to eat and how to apply these guidelines when away from home by planning and selecting Pritikin-friendly options. Patients will understand how calorie density may be adjusted for different weight management goals.  Mindful Eating  Clinical staff led group instruction and group discussion with PowerPoint presentation and patient guidebook. To enhance the learning environment the use of posters, models and videos may be added. Patients will briefly review the concepts of the Pritikin Eating Plan and the  importance of low-calorie dense foods. The concept of mindful eating will be introduced as well as the importance of paying attention to internal hunger signals. Triggers for non-hunger eating and techniques for dealing with triggers will be explored. The purpose of this lesson is to provide patients with the opportunity to review the basic principles of the Pritikin Eating Plan, discuss the value of eating mindfully and how to measure internal cues of hunger and fullness using the Hunger Scale. Patients will also discuss reasons for non-hunger eating and learn strategies to use for controlling emotional eating.  Targeting Your Nutrition Priorities Clinical staff led group instruction and group discussion with PowerPoint presentation and patient guidebook. To enhance the learning environment the use of posters, models and videos may be added. Patients will learn how to determine their genetic susceptibility to disease by reviewing their  family history. Patients will gain insight into the importance of diet as part of an overall healthy lifestyle in mitigating the impact of genetics and other environmental insults. The purpose of this lesson is to provide patients with the opportunity to assess their personal nutrition priorities by looking at their family history, their own health history and current risk factors. Patients will also be able to discuss ways of prioritizing and modifying the Pritikin Eating Plan for their highest risk areas  Menu  Clinical staff led group instruction and group discussion with PowerPoint presentation and patient guidebook. To enhance the learning environment the use of posters, models and videos may be added. Using menus brought in from [employer], or printed from [employer], patients will apply the Pritikin dining out guidelines that were presented in the [employer] video. Patients will also be able to practice these guidelines in a variety of provided scenarios. The purpose of this lesson is to provide patients with the opportunity to practice hands-on learning of the Pritikin Dining Out guidelines with actual menus and practice scenarios.  Label Reading Clinical staff led group instruction and group discussion with PowerPoint presentation and patient guidebook. To enhance the learning environment the use of posters, models and videos may be added. Patients will review and discuss the Pritikin label reading guidelines presented in Pritikin's Label Reading Educational series video. Using fool labels brought in from local grocery stores and markets, patients will apply the label reading guidelines and determine if the packaged food meet the Pritikin guidelines. The purpose of this lesson is to provide patients with the opportunity to review, discuss, and practice hands-on learning of the Pritikin Label Reading guidelines with actual packaged food labels. Cooking School  Pritikin's  [employer] are designed to teach patients ways to prepare quick, simple, and affordable recipes at home. The importance of nutrition's role in chronic disease risk reduction is reflected in its emphasis in the overall Pritikin program. By learning how to prepare essential core Pritikin Eating Plan recipes, patients will increase control over what they eat; be able to customize the flavor of foods without the use of added salt, sugar, or fat; and improve the quality of the food they consume. By learning a set of core recipes which are easily assembled, quickly prepared, and affordable, patients are more likely to prepare more healthy foods at home. These workshops focus on convenient breakfasts, simple entres, side dishes, and desserts which can be prepared with minimal effort and are consistent with nutrition recommendations for cardiovascular risk reduction. Cooking [employer] are taught by a armed forces logistics/support/administrative officer (RD) who has been trained by  the Pritikin [employer] team. The chef or RD has a clear understanding of the importance of minimizing - if not completely eliminating - added fat, sugar, and sodium in recipes. Throughout the series of Cooking School Workshop sessions, patients will learn about healthy ingredients and efficient methods of cooking to build confidence in their capability to prepare    Cooking School weekly topics:  Adding Flavor- Sodium-Free  Fast and Healthy Breakfasts  Powerhouse Plant-Based Proteins  Satisfying Salads and Dressings  Simple Sides and Sauces  International Cuisine-Spotlight on the [employer] Zones  Delicious Desserts  Savory Soups  [employer] - Meals in a Astronomer Appetizers and Snacks  Comforting Weekend Breakfasts  One-Pot Wonders   Fast Evening Meals  Landscape Architect Your Pritikin Plate  WORKSHOPS   Healthy Mindset (Psychosocial):  Focused Goals, Sustainable Changes Clinical staff led group  instruction and group discussion with PowerPoint presentation and patient guidebook. To enhance the learning environment the use of posters, models and videos may be added. Patients will be able to apply effective goal setting strategies to establish at least one personal goal, and then take consistent, meaningful action toward that goal. They will learn to identify common barriers to achieving personal goals and develop strategies to overcome them. Patients will also gain an understanding of how our mind-set can impact our ability to achieve goals and the importance of cultivating a positive and growth-oriented mind-set. The purpose of this lesson is to provide patients with a deeper understanding of how to set and achieve personal goals, as well as the tools and strategies needed to overcome common obstacles which may arise along the way.  From Head to Heart: The Power of a Healthy Outlook  Clinical staff led group instruction and group discussion with PowerPoint presentation and patient guidebook. To enhance the learning environment the use of posters, models and videos may be added. Patients will be able to recognize and describe the impact of emotions and mood on physical health. They will discover the importance of self-care and explore self-care practices which may work for them. Patients will also learn how to utilize the 4 C's to cultivate a healthier outlook and better manage stress and challenges. The purpose of this lesson is to demonstrate to patients how a healthy outlook is an essential part of maintaining good health, especially as they continue their cardiac rehab journey.  Healthy Sleep for a Healthy Heart Clinical staff led group instruction and group discussion with PowerPoint presentation and patient guidebook. To enhance the learning environment the use of posters, models and videos may be added. At the conclusion of this workshop, patients will be able to demonstrate knowledge of the  importance of sleep to overall health, well-being, and quality of life. They will understand the symptoms of, and treatments for, common sleep disorders. Patients will also be able to identify daytime and nighttime behaviors which impact sleep, and they will be able to apply these tools to help manage sleep-related challenges. The purpose of this lesson is to provide patients with a general overview of sleep and outline the importance of quality sleep. Patients will learn about a few of the most common sleep disorders. Patients will also be introduced to the concept of "sleep hygiene," and discover ways to self-manage certain sleeping problems through simple daily behavior changes. Finally, the workshop will motivate patients by clarifying the links between quality sleep and their goals of heart-healthy living.   Recognizing and Reducing Stress Clinical staff led group  instruction and group discussion with PowerPoint presentation and patient guidebook. To enhance the learning environment the use of posters, models and videos may be added. At the conclusion of this workshop, patients will be able to understand the types of stress reactions, differentiate between acute and chronic stress, and recognize the impact that chronic stress has on their health. They will also be able to apply different coping mechanisms, such as reframing negative self-talk. Patients will have the opportunity to practice a variety of stress management techniques, such as deep abdominal breathing, progressive muscle relaxation, and/or guided imagery.  The purpose of this lesson is to educate patients on the role of stress in their lives and to provide healthy techniques for coping with it.  Learning Barriers/Preferences:  Learning Barriers/Preferences - 02/23/23 0843       Learning Barriers/Preferences   Learning Barriers Hearing   hearing aids   Learning Preferences Audio;Computer/Internet;Group Instruction;Individual  Instruction;Skilled Demonstration;Verbal Instruction;Video;Written Material;Pictoral             Education Topics:  Knowledge Questionnaire Score:  Knowledge Questionnaire Score - 02/23/23 0843       Knowledge Questionnaire Score   Pre Score 22/24             Core Components/Risk Factors/Patient Goals at Admission:  Personal Goals and Risk Factors at Admission - 02/23/23 0845       Core Components/Risk Factors/Patient Goals on Admission    Weight Management Yes;Weight Maintenance    Intervention Weight Management: Develop a combined nutrition and exercise program designed to reach desired caloric intake, while maintaining appropriate intake of nutrient and fiber, sodium and fats, and appropriate energy expenditure required for the weight goal.;Weight Management: Provide education and appropriate resources to help participant work on and attain dietary goals.    Expected Outcomes Short Term: Continue to assess and modify interventions until short term weight is achieved;Long Term: Adherence to nutrition and physical activity/exercise program aimed toward attainment of established weight goal;Weight Maintenance: Understanding of the daily nutrition guidelines, which includes 25-35% calories from fat, 7% or less cal from saturated fats, less than 200mg  cholesterol, less than 1.5gm of sodium, & 5 or more servings of fruits and vegetables daily;Understanding recommendations for meals to include 15-35% energy as protein, 25-35% energy from fat, 35-60% energy from carbohydrates, less than 200mg  of dietary cholesterol, 20-35 gm of total fiber daily;Understanding of distribution of calorie intake throughout the day with the consumption of 4-5 meals/snacks    Heart Failure Yes    Intervention Provide a combined exercise and nutrition program that is supplemented with education, support and counseling about heart failure. Directed toward relieving symptoms such as shortness of breath, decreased  exercise tolerance, and extremity edema.    Expected Outcomes Improve functional capacity of life;Short term: Attendance in program 2-3 days a week with increased exercise capacity. Reported lower sodium intake. Reported increased fruit and vegetable intake. Reports medication compliance.;Short term: Daily weights obtained and reported for increase. Utilizing diuretic protocols set by physician.;Long term: Adoption of self-care skills and reduction of barriers for early signs and symptoms recognition and intervention leading to self-care maintenance.    Hypertension Yes    Intervention Provide education on lifestyle modifcations including regular physical activity/exercise, weight management, moderate sodium restriction and increased consumption of fresh fruit, vegetables, and low fat dairy, alcohol moderation, and smoking cessation.;Monitor prescription use compliance.    Expected Outcomes Short Term: Continued assessment and intervention until BP is < 140/72mm HG in hypertensive participants. < 130/36mm HG in hypertensive  participants with diabetes, heart failure or chronic kidney disease.;Long Term: Maintenance of blood pressure at goal levels.    Lipids Yes    Intervention Provide education and support for participant on nutrition & aerobic/resistive exercise along with prescribed medications to achieve LDL 70mg , HDL >40mg .    Expected Outcomes Short Term: Participant states understanding of desired cholesterol values and is compliant with medications prescribed. Participant is following exercise prescription and nutrition guidelines.;Long Term: Cholesterol controlled with medications as prescribed, with individualized exercise RX and with personalized nutrition plan. Value goals: LDL < 70mg , HDL > 40 mg.             Core Components/Risk Factors/Patient Goals Review:    Core Components/Risk Factors/Patient Goals at Discharge (Final Review):    ITP Comments:  ITP Comments     Row Name  02/23/23 0836           ITP Comments Dr. Wilbert [employer]ih[employer] medical director. Introduction to [facility] education/intensive cardiac rehab. Initial orientation packet reviewed with patient.                Comments: Participant attended orientation for the cardiac rehabilitation program on  02/23/2023  to perform initial intake and exercise walk test. Patient introduced to the [facility] education and orientation packet was reviewed. Completed 6-minute walk test, measurements, initial ITP, and exercise prescription. Vital signs stable. Telemetry-normal sinus rhythm occasional PVC, asymptomatic.   Service time was from [id number] to [phone].  Con KATHEE Pereyra, MS, ACSM-CEP 02/23/2023 10:16 AM

## 2023-02-27 NOTE — Telephone Encounter (Signed)
Printed and mailed

## 2023-02-28 ENCOUNTER — Encounter (HOSPITAL_COMMUNITY)
Admission: RE | Admit: 2023-02-28 | Discharge: 2023-02-28 | Disposition: A | Discharge status: Home / Self Care | Payer: PPO | Source: Ambulatory Visit | Attending: Internal Medicine | Admitting: Internal Medicine

## 2023-02-28 DIAGNOSIS — I2102 ST elevation (STEMI) myocardial infarction involving left anterior descending coronary artery: Secondary | ICD-10-CM

## 2023-02-28 DIAGNOSIS — Z48812 Encounter for surgical aftercare following surgery on the circulatory system: Secondary | ICD-10-CM | POA: Diagnosis not present

## 2023-02-28 DIAGNOSIS — Z955 Presence of coronary angioplasty implant and graft: Secondary | ICD-10-CM

## 2023-02-28 NOTE — Progress Notes (Signed)
Daily Session Note  Patient Details  Name: Brendan Holland MRN: [id number] Date of Birth: 27-Sep-1950 Referring Provider:   Flowsheet Row INTENSIVE CARDIAC REHAB ORIENT from 02/23/2023 in [facility]  Referring Provider Alverda Skeans, MD       Encounter Date: 02/28/2023  Check In:  Session Check In - 02/28/23 1039       Check-In   Supervising physician immediately available to respond to emergencies [facility] - Physician supervision    Physician(s) Jari Favre, PA    Location [facility]    Staff Present Gladstone Lighter, RN, Marton Redwood, MS, ACSM-CEP, CCRP, Exercise Physiologist;Olinty Peggye Pitt, MS, ACSM-CEP, Exercise Physiologist;Johnny Hale Bogus, MS, Exercise Physiologist;Jetta Walker BS, ACSM-CEP, Exercise Physiologist    Virtual Visit No    Medication changes reported     No    Fall or balance concerns reported    No    Tobacco Cessation No Change    Warm-up and Cool-down Performed as group-led instruction   CRP2 orientation   Resistance Training Performed No    VAD Patient? No    PAD/SET Patient? No      Pain Assessment   Currently in Pain? No/denies    Pain Score 0-No pain    Multiple Pain Sites No             Capillary Blood Glucose: No results found for this or any previous visit (from the past 24 hours).   Exercise Prescription Changes - 02/28/23 1200       Response to Exercise   Blood Pressure (Admit) 100/60    Blood Pressure (Exercise) 120/70    Blood Pressure (Exit) 120/80    Heart Rate (Admit) 85 bpm    Heart Rate (Exercise) 108 bpm    Heart Rate (Exit) 94 bpm    Rating of Perceived Exertion (Exercise) 11    Symptoms None    Comments Pt's first day in the CRP2 program    Duration Continue with 30 min of aerobic exercise without signs/symptoms of physical distress.    Intensity THRR unchanged      Progression   Progression Continue to progress workloads to maintain intensity  without signs/symptoms of physical distress.    Average METs 3.1      Resistance Training   Training Prescription No    Weight No weights on Wednesdays      Interval Training   Interval Training No      Treadmill   MPH 2.5    Grade 0    Minutes 15    METs 2.91      NuStep   Level 2    SPM 112    Minutes 15    METs 3.9             Social History   Tobacco Use  Smoking Status Former   Current packs/day: 0.00   Types: Cigarettes   Quit date: 12/20/1982   Years since quitting: 40.2  Smokeless Tobacco Never    Goals Met:  Exercise tolerated well No report of concerns or symptoms today  Goals Unmet:  Not Applicable  Comments: Pt started cardiac rehab today.  Pt tolerated light exercise without difficulty. VSS, telemetry-Sinus Rhythm, asymptomatic.  Medication list reconciled. Pt denies barriers to medicaiton compliance.  PSYCHOSOCIAL ASSESSMENT:  PHQ-3. Pt exhibits positive coping skills, hopeful outlook with supportive family. No psychosocial needs identified at this time, no psychosocial interventions necessary.    Pt enjoys  yard work, spending time with his family, skydiving and hiking.   Pt oriented to exercise equipment and routine.    Understanding verbalized.Thayer Headings RN BSN    Dr. Armanda Magic is Medical Director for Cardiac Rehab at [facility].

## 2023-03-02 ENCOUNTER — Encounter (HOSPITAL_COMMUNITY)
Admission: RE | Admit: 2023-03-02 | Discharge: 2023-03-02 | Disposition: A | Discharge status: Home / Self Care | Payer: PPO | Source: Ambulatory Visit | Attending: Internal Medicine | Admitting: Internal Medicine

## 2023-03-02 DIAGNOSIS — Z48812 Encounter for surgical aftercare following surgery on the circulatory system: Secondary | ICD-10-CM | POA: Diagnosis not present

## 2023-03-02 DIAGNOSIS — Z955 Presence of coronary angioplasty implant and graft: Secondary | ICD-10-CM

## 2023-03-02 DIAGNOSIS — I2102 ST elevation (STEMI) myocardial infarction involving left anterior descending coronary artery: Secondary | ICD-10-CM

## 2023-03-05 ENCOUNTER — Encounter (HOSPITAL_COMMUNITY)
Admission: RE | Admit: 2023-03-05 | Discharge: 2023-03-05 | Disposition: A | Discharge status: Home / Self Care | Payer: PPO | Source: Ambulatory Visit | Attending: Internal Medicine | Admitting: Internal Medicine

## 2023-03-05 DIAGNOSIS — Z955 Presence of coronary angioplasty implant and graft: Secondary | ICD-10-CM

## 2023-03-05 DIAGNOSIS — Z48812 Encounter for surgical aftercare following surgery on the circulatory system: Secondary | ICD-10-CM | POA: Diagnosis not present

## 2023-03-05 DIAGNOSIS — I2102 ST elevation (STEMI) myocardial infarction involving left anterior descending coronary artery: Secondary | ICD-10-CM

## 2023-03-05 NOTE — Telephone Encounter (Addendum)
 Pt said:  Waiting on letter from social security   Patient form scanned in Doc form scanned in and waiting on dr signature -03/16/23

## 2023-03-05 NOTE — Telephone Encounter (Signed)
 Sent the patient a mychart message for a follow up since we have not heard back about their application

## 2023-03-07 ENCOUNTER — Encounter (HOSPITAL_COMMUNITY)
Admission: RE | Admit: 2023-03-07 | Discharge: 2023-03-07 | Disposition: A | Discharge status: Home / Self Care | Payer: PPO | Source: Ambulatory Visit | Attending: Internal Medicine | Admitting: Internal Medicine

## 2023-03-07 DIAGNOSIS — Z955 Presence of coronary angioplasty implant and graft: Secondary | ICD-10-CM

## 2023-03-07 DIAGNOSIS — Z48812 Encounter for surgical aftercare following surgery on the circulatory system: Secondary | ICD-10-CM | POA: Diagnosis not present

## 2023-03-07 DIAGNOSIS — I2102 ST elevation (STEMI) myocardial infarction involving left anterior descending coronary artery: Secondary | ICD-10-CM

## 2023-03-09 ENCOUNTER — Encounter (HOSPITAL_COMMUNITY)
Admission: RE | Admit: 2023-03-09 | Discharge: 2023-03-09 | Disposition: A | Discharge status: Home / Self Care | Payer: PPO | Source: Ambulatory Visit | Attending: Internal Medicine | Admitting: Internal Medicine

## 2023-03-09 DIAGNOSIS — Z955 Presence of coronary angioplasty implant and graft: Secondary | ICD-10-CM

## 2023-03-09 DIAGNOSIS — Z48812 Encounter for surgical aftercare following surgery on the circulatory system: Secondary | ICD-10-CM | POA: Diagnosis not present

## 2023-03-09 DIAGNOSIS — I2102 ST elevation (STEMI) myocardial infarction involving left anterior descending coronary artery: Secondary | ICD-10-CM

## 2023-03-12 ENCOUNTER — Encounter (HOSPITAL_COMMUNITY)
Admission: RE | Admit: 2023-03-12 | Discharge: 2023-03-12 | Disposition: A | Discharge status: Home / Self Care | Payer: PPO | Source: Ambulatory Visit | Attending: Internal Medicine | Admitting: Internal Medicine

## 2023-03-12 DIAGNOSIS — Z48812 Encounter for surgical aftercare following surgery on the circulatory system: Secondary | ICD-10-CM | POA: Diagnosis not present

## 2023-03-12 DIAGNOSIS — I2102 ST elevation (STEMI) myocardial infarction involving left anterior descending coronary artery: Secondary | ICD-10-CM

## 2023-03-12 DIAGNOSIS — Z955 Presence of coronary angioplasty implant and graft: Secondary | ICD-10-CM

## 2023-03-14 ENCOUNTER — Encounter (HOSPITAL_COMMUNITY)
Admission: RE | Admit: 2023-03-14 | Discharge: 2023-03-14 | Disposition: A | Discharge status: Home / Self Care | Payer: PPO | Source: Ambulatory Visit | Attending: Internal Medicine | Admitting: Internal Medicine

## 2023-03-14 DIAGNOSIS — I2102 ST elevation (STEMI) myocardial infarction involving left anterior descending coronary artery: Secondary | ICD-10-CM

## 2023-03-14 DIAGNOSIS — Z48812 Encounter for surgical aftercare following surgery on the circulatory system: Secondary | ICD-10-CM | POA: Diagnosis not present

## 2023-03-14 DIAGNOSIS — Z955 Presence of coronary angioplasty implant and graft: Secondary | ICD-10-CM

## 2023-03-15 ENCOUNTER — Ambulatory Visit (HOSPITAL_COMMUNITY)
Admission: RE | Admit: 2023-03-15 | Discharge: 2023-03-15 | Disposition: A | Discharge status: Home / Self Care | Payer: PPO | Source: Ambulatory Visit | Attending: Cardiology | Admitting: Cardiology

## 2023-03-15 DIAGNOSIS — R0989 Other specified symptoms and signs involving the circulatory and respiratory systems: Secondary | ICD-10-CM | POA: Insufficient documentation

## 2023-03-16 ENCOUNTER — Encounter (HOSPITAL_COMMUNITY)
Admission: RE | Admit: 2023-03-16 | Discharge: 2023-03-16 | Disposition: A | Discharge status: Home / Self Care | Payer: PPO | Source: Ambulatory Visit | Attending: Internal Medicine | Admitting: Internal Medicine

## 2023-03-16 DIAGNOSIS — Z955 Presence of coronary angioplasty implant and graft: Secondary | ICD-10-CM

## 2023-03-16 DIAGNOSIS — I2102 ST elevation (STEMI) myocardial infarction involving left anterior descending coronary artery: Secondary | ICD-10-CM

## 2023-03-16 DIAGNOSIS — Z48812 Encounter for surgical aftercare following surgery on the circulatory system: Secondary | ICD-10-CM | POA: Diagnosis not present

## 2023-03-16 NOTE — Telephone Encounter (Signed)
 Hello, in media there is scanned in as [employer] patient assistance doctor form -can someone get the provider to scan this and finish filling out the rx portion? I have filled out as much as I can. Thank you!

## 2023-03-19 ENCOUNTER — Encounter (HOSPITAL_COMMUNITY)
Admission: RE | Admit: 2023-03-19 | Discharge: 2023-03-19 | Disposition: A | Discharge status: Home / Self Care | Payer: PPO | Source: Ambulatory Visit | Attending: Internal Medicine | Admitting: Internal Medicine

## 2023-03-19 DIAGNOSIS — I2102 ST elevation (STEMI) myocardial infarction involving left anterior descending coronary artery: Secondary | ICD-10-CM

## 2023-03-19 DIAGNOSIS — I252 Old myocardial infarction: Secondary | ICD-10-CM | POA: Diagnosis not present

## 2023-03-19 DIAGNOSIS — Z48812 Encounter for surgical aftercare following surgery on the circulatory system: Secondary | ICD-10-CM | POA: Insufficient documentation

## 2023-03-19 DIAGNOSIS — Z955 Presence of coronary angioplasty implant and graft: Secondary | ICD-10-CM | POA: Diagnosis present

## 2023-03-19 NOTE — Telephone Encounter (Signed)
 Patient assistance form signed by provider and faxed to cardiology PAP team.                     -------Fax Transmission Report-------  To:               Recipient at [phone] Subject:          Fw: Send data from [id number] 03/19/2023 17:44 Result:           The transmission was successful. Explanation:      All Pages Ok Pages Sent:       4 Connect Time:     3 minutes, 3 seconds Transmit Time:    03/19/2023 17:17 Transfer Rate:    14400 Status Code:      0000 Retry Count:      0 Job Id:           [id number] Unique Id:        [id number] Fax Line:         42 Fax Server:       [email or web address]

## 2023-03-20 ENCOUNTER — Encounter: Payer: Self-pay | Admitting: Pharmacy Technician

## 2023-03-20 NOTE — Progress Notes (Signed)
 Cardiac Individual Treatment Plan  Patient Details  Name: Brendan Holland MRN: [id number] Date of Birth: 20-Jul-1950 Referring Provider:   Flowsheet Row INTENSIVE CARDIAC REHAB ORIENT from 02/23/2023 in [facility]  Referring Provider Alverda Skeans, MD       Initial Encounter Date:  Flowsheet Row INTENSIVE CARDIAC REHAB ORIENT from 02/23/2023 in [facility]  Date 02/23/23       Visit Diagnosis: 02/03/23 STEMI  02/03/23 DES LAD  Patient's Home Medications on Admission:  Current Outpatient Medications:    aspirin EC 81 MG tablet, Take 81 mg by mouth at bedtime., Disp: , Rfl:    atorvastatin (LIPITOR) 80 MG tablet, Take 1 tablet (80 mg total) by mouth daily., Disp: 30 tablet, Rfl: 6   donepezil (ARICEPT) 10 MG tablet, TAKE 1 TABLET BY MOUTH DAILY (Patient taking differently: Take 10 mg by mouth at bedtime.), Disp: 90 tablet, Rfl: 2   empagliflozin (JARDIANCE) 10 MG TABS tablet, Take 1 tablet (10 mg total) by mouth daily before breakfast., Disp: 90 tablet, Rfl: 3   GLUCOSAMINE CHONDROITIN COMPLX PO, Take 1 tablet by mouth at bedtime., Disp: , Rfl:    metoprolol succinate (TOPROL XL) 25 MG 24 hr tablet, Take 1 tablet (25 mg total) by mouth daily., Disp: 90 tablet, Rfl: 3   Multiple Vitamins-Minerals (MULTIVITAMIN WITH MINERALS) tablet, Take 1 tablet by mouth at bedtime., Disp: , Rfl:    Omega-3 Fatty Acids (FISH OIL PO), Take 1 capsule by mouth at bedtime., Disp: , Rfl:    pantoprazole (PROTONIX) 40 MG tablet, Take 1 tablet (40 mg total) by mouth daily., Disp: 30 tablet, Rfl: 6   spironolactone (ALDACTONE) 25 MG tablet, Take 0.5 tablets (12.5 mg total) by mouth daily. (Patient taking differently: Take 25 mg by mouth daily.), Disp: 15 tablet, Rfl: 6   ticagrelor (BRILINTA) 90 MG TABS tablet, Take 1 tablet (90 mg total) by mouth 2 (two) times daily., Disp: 60 tablet, Rfl: 6    traZODone (DESYREL) 50 MG tablet, Take 50 mg by mouth at bedtime. (Patient taking differently: Take 25 mg by mouth at bedtime.), Disp: , Rfl:   Past Medical History: Past Medical History:  Diagnosis Date   Arthritis    "arthritis right knee"   Cancer (HCC)    "skin cancer of scalp" -tx with topical meds-"all clear now"   Dementia (HCC)    "pre Alzheimers" "[facility]"conitive impairment.  Negative on latest testing   DVT of lower extremity (deep venous thrombosis) (HCC)    GERD (gastroesophageal reflux disease)    Headache(784.0)    Heart rate slow    Avid runner" 30 miles per week"   Hepatitis C    Tx. [facility]- 3 yrs ago- "now Clear"   Sleep apnea    no cpap use today"condition improved".  Most recent study neg   Stroke (HCC)     Tobacco Use: Social History   Tobacco Use  Smoking Status Former   Current packs/day: 0.00   Types: Cigarettes   Quit date: 12/20/1982   Years since quitting: 40.2  Smokeless Tobacco Never    Labs: Review Flowsheet       Latest Ref Rng & Units 02/03/2023 02/05/2023  Labs for ITP Cardiac and Pulmonary Rehab  Cholestrol 0 - 200 mg/dL 409  -  LDL (calc) 0 - 99 mg/dL 88  -  HDL-C >81 mg/dL 40  -  Trlycerides <  150 mg/dL 53  -  Hemoglobin Z6X 4.8 - 5.6 % 5.3  -  PH, Arterial 7.35 - 7.45 - 7.271   PCO2 arterial 32 - 48 mmHg - 41.2   Bicarbonate 20.0 - 28.0 mmol/L - 19.0   TCO2 22 - 32 mmol/L - 20   Acid-base deficit 0.0 - 2.0 mmol/L - 8.0   O2 Saturation % - 96     Capillary Blood Glucose: No results found for: "GLUCAP"   Exercise Target Goals: Exercise Program Goal: Individual exercise prescription set using results from initial 6 min walk test and THRR while considering  patient's activity barriers and safety.   Exercise Prescription Goal: Initial exercise prescription builds to 30-45 minutes a day of aerobic activity, 2-3 days per week.  Home exercise guidelines will be given to patient during program as part of exercise prescription that  the participant will acknowledge.  Activity Barriers & Risk Stratification:  Activity Barriers & Cardiac Risk Stratification - 02/23/23 0841       Activity Barriers & Cardiac Risk Stratification   Activity Barriers Arthritis;Right Knee Replacement;Decreased Ventricular Function    Cardiac Risk Stratification High   <5 METs on            6 Minute Walk:  6 Minute Walk     Row Name 02/23/23 0958         6 Minute Walk   Phase Initial     Distance 1200 feet     Walk Time 6 minutes     # of Rest Breaks 0     MPH 2.27     METS 2.67     RPE 10     Perceived Dyspnea  0     VO2 Peak 9.35     Symptoms No     Resting HR 64 bpm     Resting BP 122/64     Resting Oxygen Saturation  98 %     Exercise Oxygen Saturation  during 6 min walk 100 %     Max Ex. HR 75 bpm     Max Ex. BP 138/72     2 Minute Post BP 118/68              Oxygen Initial Assessment:   Oxygen Re-Evaluation:   Oxygen Discharge (Final Oxygen Re-Evaluation):   Initial Exercise Prescription:  Initial Exercise Prescription - 02/23/23 0900       Date of Initial Exercise RX and Referring Provider   Date 02/23/23    Referring Provider Alverda Skeans, MD    Expected Discharge Date 05/16/23      Treadmill   MPH 2    Grade 0    Minutes 15    METs 2.5      NuStep   Level 2    SPM 60    Minutes 15    METs 2.5      Prescription Details   Frequency (times per week) 3    Duration Progress to 30 minutes of continuous aerobic without signs/symptoms of physical distress      Intensity   THRR 40-80% of Max Heartrate 58-117    Ratings of Perceived Exertion 11-13    Perceived Dyspnea 0-4      Progression   Progression Continue progressive overload as per policy without signs/symptoms or physical distress.      Resistance Training   Training Prescription Yes    Weight 4    Reps 10-15  Perform Capillary Blood Glucose checks as needed.  Exercise Prescription Changes:    Exercise Prescription Changes     Row Name 02/28/23 1200 03/05/23 1029 03/19/23 1027         Response to Exercise   Blood Pressure (Admit) 100/60 118/62 120/62     Blood Pressure (Exercise) 120/70 128/78 --     Blood Pressure (Exit) 120/80 102/64 106/60     Heart Rate (Admit) 85 bpm 55 bpm 50 bpm     Heart Rate (Exercise) 108 bpm 108 bpm 100 bpm     Heart Rate (Exit) 94 bpm 68 bpm 59 bpm     Rating of Perceived Exertion (Exercise) 11 10 10      Symptoms None None None     Comments Pt's first day in the CRP2 program -- Reviewed goals with patient. Increased workloads on TM and NuStep. Increased hand weights.     Duration Continue with 30 min of aerobic exercise without signs/symptoms of physical distress. Continue with 30 min of aerobic exercise without signs/symptoms of physical distress. Continue with 30 min of aerobic exercise without signs/symptoms of physical distress.     Intensity THRR unchanged THRR unchanged THRR unchanged       Progression   Progression Continue to progress workloads to maintain intensity without signs/symptoms of physical distress. Continue to progress workloads to maintain intensity without signs/symptoms of physical distress. Continue to progress workloads to maintain intensity without signs/symptoms of physical distress.     Average METs 3.1 3.4 4.1       Resistance Training   Training Prescription No Yes Yes     Weight No weights on Wednesdays 4 lbs 5 lbs     Reps -- 10-15 10-15     Time -- 10 Minutes 10 Minutes       Interval Training   Interval Training No No No       Treadmill   MPH 2.5 2.5 3.2     Grade 0 0 1.5     Minutes 15 15 15      METs 2.91 2.91 4.11       NuStep   Level 2 3 5      SPM 112 112 113     Minutes 15 15 15      METs 3.9 4 4.1       Home Exercise Plan   Plans to continue exercise at -- -- Home (comment)  Walking     Frequency -- -- Add 3 additional days to program exercise sessions.              Exercise  Comments:   Exercise Comments     Row Name 02/28/23 1206 03/19/23 1059         Exercise Comments Pt's first day in the CRP2 program. Pt did well with no complaints. Increased workload on treadmill as pt felt it was too easy. Reviewed goals with Tommy. Increased workloads on the treadmill and Nustep. Increased hand weights to 5 lbs.               Exercise Goals and Review:   Exercise Goals     Row Name 02/23/23 0841             Exercise Goals   Increase Physical Activity Yes       Intervention Provide advice, education, support and counseling about physical activity/exercise needs.;Develop an individualized exercise prescription for aerobic and resistive training based on initial evaluation findings, risk stratification, comorbidities and participant's personal  goals.       Expected Outcomes Short Term: Attend rehab on a regular basis to increase amount of physical activity.;Long Term: Exercising regularly at least 3-5 days a week.;Long Term: Add in home exercise to make exercise part of routine and to increase amount of physical activity.       Increase Strength and Stamina Yes       Intervention Provide advice, education, support and counseling about physical activity/exercise needs.;Develop an individualized exercise prescription for aerobic and resistive training based on initial evaluation findings, risk stratification, comorbidities and participant's personal goals.       Expected Outcomes Short Term: Increase workloads from initial exercise prescription for resistance, speed, and METs.;Short Term: Perform resistance training exercises routinely during rehab and add in resistance training at home;Long Term: Improve cardiorespiratory fitness, muscular endurance and strength as measured by increased METs and functional capacity ( )       Able to understand and use rate of perceived exertion (RPE) scale Yes       Intervention Provide education and explanation on how to use RPE  scale       Expected Outcomes Short Term: Able to use RPE daily in rehab to express subjective intensity level;Long Term:  Able to use RPE to guide intensity level when exercising independently       Knowledge and understanding of Target Heart Rate Range (THRR) Yes       Intervention Provide education and explanation of THRR including how the numbers were predicted and where they are located for reference       Expected Outcomes Short Term: Able to state/look up THRR;Short Term: Able to use daily as guideline for intensity in rehab;Long Term: Able to use THRR to govern intensity when exercising independently       Understanding of Exercise Prescription Yes       Intervention Provide education, explanation, and written materials on patient's individual exercise prescription       Expected Outcomes Short Term: Able to explain program exercise prescription;Long Term: Able to explain home exercise prescription to exercise independently                Exercise Goals Re-Evaluation :  Exercise Goals Re-Evaluation     Row Name 02/28/23 1205 03/19/23 1059           Exercise Goal Re-Evaluation   Exercise Goals Review Increase Physical Activity;Understanding of Exercise Prescription;Increase Strength and Stamina;Knowledge and understanding of Target Heart Rate Range (THRR);Able to understand and use rate of perceived exertion (RPE) scale Increase Physical Activity;Understanding of Exercise Prescription;Increase Strength and Stamina;Knowledge and understanding of Target Heart Rate Range (THRR);Able to understand and use rate of perceived exertion (RPE) scale      Comments Pt's first day in the CRP2 program. Pt understands the exercise Rx, RPE scale and THRR. Brendan Holland continues to increase workloads appropriately at cardiac rehab. He is walking at home 20 minutes 4 days/week. His goal is to increase his endurance and stamina. He would like to resume body weight exercises, such as leg lifts and sit-ups. I  instructed him to check with his cardiologist to see if he has any lifting restrictions before resuming body weight exercises, and he is agreeable.      Expected Outcomes Will continue to monitor the patient and increase exercise workloads as tolerated. Continue to progress workloads as tolerated to help increase endurance and stamina.               Discharge Exercise Prescription (Final  Exercise Prescription Changes):  Exercise Prescription Changes - 03/19/23 1027       Response to Exercise   Blood Pressure (Admit) 120/62    Blood Pressure (Exit) 106/60    Heart Rate (Admit) 50 bpm    Heart Rate (Exercise) 100 bpm    Heart Rate (Exit) 59 bpm    Rating of Perceived Exertion (Exercise) 10    Symptoms None    Comments Reviewed goals with patient. Increased workloads on TM and NuStep. Increased hand weights.    Duration Continue with 30 min of aerobic exercise without signs/symptoms of physical distress.    Intensity THRR unchanged      Progression   Progression Continue to progress workloads to maintain intensity without signs/symptoms of physical distress.    Average METs 4.1      Resistance Training   Training Prescription Yes    Weight 5 lbs    Reps 10-15    Time 10 Minutes      Interval Training   Interval Training No      Treadmill   MPH 3.2    Grade 1.5    Minutes 15    METs 4.11      NuStep   Level 5    SPM 113    Minutes 15    METs 4.1      Home Exercise Plan   Plans to continue exercise at Home (comment)   Walking   Frequency Add 3 additional days to program exercise sessions.             Nutrition:  Target Goals: Understanding of nutrition guidelines, daily intake of sodium 1500mg , cholesterol 200mg , calories 30% from fat and 7% or less from saturated fats, daily to have 5 or more servings of fruits and vegetables.  Biometrics:  Pre Biometrics - 02/23/23 0835       Pre Biometrics   Waist Circumference 34 inches    Hip Circumference 37  inches    Waist to Hip Ratio 0.92 %    Triceps Skinfold 10 mm    % Body Fat 42 %    Grip Strength 42 kg    Flexibility 15 in    Single Leg Stand 17.06 seconds              Nutrition Therapy Plan and Nutrition Goals:  Nutrition Therapy & Goals - 03/15/23 0845       Nutrition Therapy   Diet heart healthy diet    Drug/Food Interactions Statins/Certain Fruits      Personal Nutrition Goals   Nutrition Goal Patient to identify strategies for reducing cardiovascular risk by attending the [facility] education and nutrition series weekly.    Personal Goal #2 Patient to improve diet quality by using the plate method as a guide for meal planning to include lean protein/plant protein, fruits, vegetables, whole grains, nonfat dairy as part of a well-balanced diet.    Comments Goals in progress. Yassen has medical history of STEMI, HTN, hyperlipidemia, dementia. His blood pressure remains well controlled. Lipids WNL, LDL 88. He has maintained his weight since starting with our program. He has attended 3 [facility] education sessions. Patient will benefit from participation in intensive cardiac rehab for nutrition, exercise, and lifestyle modification.      Intervention Plan   Intervention Prescribe, educate and counsel regarding individualized specific dietary modifications aiming towards targeted core components such as weight, hypertension, lipid management, diabetes, heart failure and other comorbidities.;Nutrition handout(s) given to patient.    Expected  Outcomes Short Term Goal: Understand basic principles of dietary content, such as calories, fat, sodium, cholesterol and nutrients.;Long Term Goal: Adherence to prescribed nutrition plan.             Nutrition Assessments:  MEDIFICTS Score Key: >=70 Need to make dietary changes  40-70 Heart Healthy Diet <= 40 Therapeutic Level Cholesterol Diet   Flowsheet Row INTENSIVE CARDIAC REHAB from 02/28/2023 in [facility]  for [facility], [facility]  Picture Your Plate Total Score on Admission 56      Picture Your Plate Scores: <40 Unhealthy dietary pattern with much room for improvement. 41-50 Dietary pattern unlikely to meet recommendations for good health and room for improvement. 51-60 More healthful dietary pattern, with some room for improvement.  >60 Healthy dietary pattern, although there may be some specific behaviors that could be improved.    Nutrition Goals Re-Evaluation:  Nutrition Goals Re-Evaluation     Row Name 03/15/23 0845             Goals   Current Weight 171 lb 4.8 oz (77.7 kg)       Comment LDL 88, HDL 40, Lpa WNL, A1c WNL       Expected Outcome Goals in progress. Legrande has medical history of STEMI, HTN, hyperlipidemia, dementia. His blood pressure remains well controlled. Lipids WNL, LDL 88. He has maintained his weight since starting with our program. He has attended 3 [facility] education sessions. Patient will benefit from participation in intensive cardiac rehab for nutrition, exercise, and lifestyle modification.l                Nutrition Goals Re-Evaluation:  Nutrition Goals Re-Evaluation     Row Name 03/15/23 0845             Goals   Current Weight 171 lb 4.8 oz (77.7 kg)       Comment LDL 88, HDL 40, Lpa WNL, A1c WNL       Expected Outcome Goals in progress. Ramesses has medical history of STEMI, HTN, hyperlipidemia, dementia. His blood pressure remains well controlled. Lipids WNL, LDL 88. He has maintained his weight since starting with our program. He has attended 3 [facility] education sessions. Patient will benefit from participation in intensive cardiac rehab for nutrition, exercise, and lifestyle modification.l                Nutrition Goals Discharge (Final Nutrition Goals Re-Evaluation):  Nutrition Goals Re-Evaluation - 03/15/23 0845       Goals   Current Weight 171 lb 4.8 oz (77.7 kg)    Comment LDL 88, HDL 40, Lpa WNL, A1c WNL     Expected Outcome Goals in progress. Abelardo has medical history of STEMI, HTN, hyperlipidemia, dementia. His blood pressure remains well controlled. Lipids WNL, LDL 88. He has maintained his weight since starting with our program. He has attended 3 [facility] education sessions. Patient will benefit from participation in intensive cardiac rehab for nutrition, exercise, and lifestyle modification.l             Psychosocial: Target Goals: Acknowledge presence or absence of significant depression and/or stress, maximize coping skills, provide positive support system. Participant is able to verbalize types and ability to use techniques and skills needed for reducing stress and depression.  Initial Review & Psychosocial Screening:  Initial Psych Review & Screening - 02/23/23 0845       Initial Review   Current issues with None Identified  Family Dynamics   Good Support System? Yes   Wife for support   Comments Brendan Holland denies any feelings of depression/stress/anxiety. However, he shared that he has some days when he feels down because he is unable to participate in former activities and is starting to get bored at home. Brendan Holland is eager to exercise and begin CRP2.      Barriers   Psychosocial barriers to participate in program There are no identifiable barriers or psychosocial needs.      Screening Interventions   Interventions Encouraged to exercise;Provide feedback about the scores to participant    Expected Outcomes Long Term goal: The participant improves quality of Life and PHQ9 Scores as seen by post scores and/or verbalization of changes;Short Term goal: Identification and review with participant of any Quality of Life or Depression concerns found by scoring the questionnaire.             Quality of Life Scores:  Quality of Life - 02/23/23 1012       Quality of Life   Select Quality of Life      Quality of Life Scores   Health/Function Pre 27.47 %    Socioeconomic Pre 28 %     Psych/Spiritual Pre 30 %    Family Pre 28.8 %    GLOBAL Pre 28.25 %            Scores of 19 and below usually indicate a poorer quality of life in these areas.  A difference of  2-3 points is a clinically meaningful difference.  A difference of 2-3 points in the total score of the Quality of Life Index has been associated with significant improvement in overall quality of life, self-image, physical symptoms, and general health in studies assessing change in quality of life.  PHQ-9: Review Flowsheet       02/23/2023  Depression screen PHQ 2/9  Decreased Interest 1  Down, Depressed, Hopeless 1  PHQ - 2 Score 2  Altered sleeping 0  Tired, decreased energy 1  Change in appetite 0  Feeling bad or failure about yourself  0  Trouble concentrating 0  Moving slowly or fidgety/restless 0  Suicidal thoughts 0  PHQ-9 Score 3  Difficult doing work/chores Not difficult at all   Interpretation of Total Score  Total Score Depression Severity:  1-4 = Minimal depression, 5-9 = Mild depression, 10-14 = Moderate depression, 15-19 = Moderately severe depression, 20-27 = Severe depression   Psychosocial Evaluation and Intervention:   Psychosocial Re-Evaluation:  Psychosocial Re-Evaluation     Row Name 02/28/23 1435 03/16/23 1530           Psychosocial Re-Evaluation   Current issues with None Identified None Identified      Interventions Encouraged to attend Cardiac Rehabilitation for the exercise Encouraged to attend Cardiac Rehabilitation for the exercise      Continue Psychosocial Services  No Follow up required No Follow up required               Psychosocial Discharge (Final Psychosocial Re-Evaluation):  Psychosocial Re-Evaluation - 03/16/23 1530       Psychosocial Re-Evaluation   Current issues with None Identified    Interventions Encouraged to attend Cardiac Rehabilitation for the exercise    Continue Psychosocial Services  No Follow up required              Vocational Rehabilitation: Provide vocational rehab assistance to qualifying candidates.   Vocational Rehab Evaluation & Intervention:  Vocational Rehab - 02/23/23  1610       Initial Vocational Rehab Evaluation & Intervention   Assessment shows need for Vocational Rehabilitation No   Brendan Holland is retired            Education: Education Goals: Education classes will be provided on a weekly basis, covering required topics. Participant will state understanding/return demonstration of topics presented.    Education     Row Name 02/28/23 1000     Education   Cardiac Education Topics [facility]in   Orthoptist   Educator Dietitian   Weekly Topic Adding Flavor - Sodium-Free   Instruction Review Code 1- Verbalizes Understanding   Class Start Time 1145   Class Stop Time 1225   Class Time Calculation (min) 40 min    Row Name 03/02/23 1500     Education   Cardiac Education Topics Pritikin   Select Core Videos     Core Videos   Educator Dietitian   Select Nutrition   Nutrition Overview of the Pritikin Eating Plan   Instruction Review Code 1- Verbalizes Understanding   Class Start Time 1145   Class Stop Time 1238   Class Time Calculation (min) 53 min    Row Name 03/09/23 1200     Education   Cardiac Education Topics [facility]ritikin   Select Core Videos     Core Videos   Educator Dietitian   Select Nutrition   Nutrition Other  Label Reading   Instruction Review Code 1- Verbalizes Understanding   Class Start Time 1152   Class Stop Time 1233   Class Time Calculation (min) 41 min    Row Name 03/19/23 1100     Education   Cardiac Education Topics [facility]ritikin   Select Workshops     Workshops   Educator Exercise Physiologist   Select Psychosocial   Psychosocial Workshop Recognizing and Reducing Stress   Instruction Review Code 1- Verbalizes Understanding   Class Start Time 1145   Class Stop Time 1228   Class Time Calculation (min) 43 min             Core Videos: Exercise    Move It!  Clinical staff conducted group or individual video education with verbal and written material and guidebook.  Patient learns the recommended Pritikin exercise program. Exercise with the goal of living a long, healthy life. Some of the health benefits of exercise include controlled diabetes, healthier blood pressure levels, improved cholesterol levels, improved heart and lung capacity, improved sleep, and better body composition. Everyone should speak with their doctor before starting or changing an exercise routine.  Biomechanical Limitations Clinical staff conducted group or individual video education with verbal and written material and guidebook.  Patient learns how biomechanical limitations can impact exercise and how we can mitigate and possibly overcome limitations to have an impactful and balanced exercise routine.  Body Composition Clinical staff conducted group or individual video education with verbal and written material and guidebook.  Patient learns that body composition (ratio of muscle mass to fat mass) is a key component to assessing overall fitness, rather than body weight alone. Increased fat mass, especially visceral belly fat, can put [place] at increased risk for metabolic syndrome, type 2 diabetes, heart disease, and even death. It is recommended to combine diet and exercise (cardiovascular and resistance training) to improve your body composition. Seek guidance from your physician and exercise physiologist before implementing an exercise routine.  Exercise Action Plan Clinical staff conducted group or individual  video education with verbal and written material and guidebook.  Patient learns the recommended strategies to achieve and enjoy long-term exercise adherence, including variety, self-motivation, self-efficacy, and positive decision making. Benefits of exercise include fitness, good health, weight management, more energy, better  sleep, less stress, and overall well-being.  Medical   Heart Disease Risk Reduction Clinical staff conducted group or individual video education with verbal and written material and guidebook.  Patient learns our heart is our most vital organ as it circulates oxygen, nutrients, white blood cells, and hormones throughout the entire body, and carries waste away. Data supports a plant-based eating plan like the Pritikin Program for its effectiveness in slowing progression of and reversing heart disease. The video provides a number of recommendations to address heart disease.   Metabolic Syndrome and Belly Fat  Clinical staff conducted group or individual video education with verbal and written material and guidebook.  Patient learns what metabolic syndrome is, how it leads to heart disease, and how one can reverse it and keep it from coming back. You have metabolic syndrome if you have 3 of the following 5 criteria: abdominal obesity, high blood pressure, high triglycerides, low HDL cholesterol, and high blood sugar.  Hypertension and Heart Disease Clinical staff conducted group or individual video education with verbal and written material and guidebook.  Patient learns that high blood pressure, or hypertension, is very common in the [place]. Hypertension is largely due to excessive salt intake, but other important risk factors include being overweight, physical inactivity, drinking too much alcohol, smoking, and not eating enough potassium from fruits and vegetables. High blood pressure is a leading risk factor for heart attack, stroke, congestive heart failure, dementia, kidney failure, and premature death. Long-term effects of excessive salt intake include stiffening of the arteries and thickening of heart muscle and organ damage. Recommendations include ways to reduce hypertension and the risk of heart disease.  Diseases of Our Time - Focusing on Diabetes Clinical staff conducted group or  individual video education with verbal and written material and guidebook.  Patient learns why the best way to stop diseases of our time is prevention, through food and other lifestyle changes. Medicine (such as prescription pills and surgeries) is often only a Band-Aid on the problem, not a long-term solution. Most common diseases of our time include obesity, type 2 diabetes, hypertension, heart disease, and cancer. The Pritikin Program is recommended and has been proven to help reduce, reverse, and/or prevent the damaging effects of metabolic syndrome.  Nutrition   Overview of the Pritikin Eating Plan  Clinical staff conducted group or individual video education with verbal and written material and guidebook.  Patient learns about the Pritikin Eating Plan for disease risk reduction. The Pritikin Eating Plan emphasizes a wide variety of unrefined, minimally-processed carbohydrates, like fruits, vegetables, whole grains, and legumes. Go, Caution, and Stop food choices are explained. Plant-based and lean animal proteins are emphasized. Rationale provided for low sodium intake for blood pressure control, low added sugars for blood sugar stabilization, and low added fats and oils for coronary artery disease risk reduction and weight management.  Calorie Density  Clinical staff conducted group or individual video education with verbal and written material and guidebook.  Patient learns about calorie density and how it impacts the Pritikin Eating Plan. Knowing the characteristics of the food you choose will help you decide whether those foods will lead to weight gain or weight loss, and whether you want to consume more or less of them.  Weight loss is usually a side effect of the Pritikin Eating Plan because of its focus on low calorie-dense foods.  Label Reading  Clinical staff conducted group or individual video education with verbal and written material and guidebook.  Patient learns about the Pritikin  recommended label reading guidelines and corresponding recommendations regarding calorie density, added sugars, sodium content, and whole grains.  Dining Out - Part 1  Clinical staff conducted group or individual video education with verbal and written material and guidebook.  Patient learns that restaurant meals can be sabotaging because they can be so high in calories, fat, sodium, and/or sugar. Patient learns recommended strategies on how to positively address this and avoid unhealthy pitfalls.  Facts on Fats  Clinical staff conducted group or individual video education with verbal and written material and guidebook.  Patient learns that lifestyle modifications can be just as effective, if not more so, as many medications for lowering your risk of heart disease. A Pritikin lifestyle can help to reduce your risk of inflammation and atherosclerosis (cholesterol build-up, or plaque, in the artery walls). Lifestyle interventions such as dietary choices and physical activity address the cause of atherosclerosis. A review of the types of fats and their impact on blood cholesterol levels, along with dietary recommendations to reduce fat intake is also included.  Nutrition Action Plan  Clinical staff conducted group or individual video education with verbal and written material and guidebook.  Patient learns how to incorporate Pritikin recommendations into their lifestyle. Recommendations include planning and keeping personal health goals in mind as an important part of their success.  Healthy Mind-Set    Healthy Minds, Bodies, Hearts  Clinical staff conducted group or individual video education with verbal and written material and guidebook.  Patient learns how to identify when they are stressed. Video will discuss the impact of that stress, as well as the many benefits of stress management. Patient will also be introduced to stress management techniques. The way we think, act, and feel has an impact on  our hearts.  How Our Thoughts Can Heal Our Hearts  Clinical staff conducted group or individual video education with verbal and written material and guidebook.  Patient learns that negative thoughts can cause depression and anxiety. This can result in negative lifestyle behavior and serious health problems. Cognitive behavioral therapy is an effective method to help control our thoughts in order to change and improve our emotional outlook.  Additional Videos:  Exercise    Improving Performance  Clinical staff conducted group or individual video education with verbal and written material and guidebook.  Patient learns to use a non-linear approach by alternating intensity levels and lengths of time spent exercising to help burn more calories and lose more body fat. Cardiovascular exercise helps improve heart health, metabolism, hormonal balance, blood sugar control, and recovery from fatigue. Resistance training improves strength, endurance, balance, coordination, reaction time, metabolism, and muscle mass. Flexibility exercise improves circulation, posture, and balance. Seek guidance from your physician and exercise physiologist before implementing an exercise routine and learn your capabilities and proper form for all exercise.  Introduction to Yoga  Clinical staff conducted group or individual video education with verbal and written material and guidebook.  Patient learns about yoga, a discipline of the coming together of mind, breath, and body. The benefits of yoga include improved flexibility, improved range of motion, better posture and core strength, increased lung function, weight loss, and positive self-image. Yoga's heart health benefits include lowered blood pressure, healthier heart rate,  decreased cholesterol and triglyceride levels, improved immune function, and reduced stress. Seek guidance from your physician and exercise physiologist before implementing an exercise routine and learn  your capabilities and proper form for all exercise.  Medical   Aging: Enhancing Your Quality of Life  Clinical staff conducted group or individual video education with verbal and written material and guidebook.  Patient learns key strategies and recommendations to stay in good physical health and enhance quality of life, such as prevention strategies, having an advocate, securing a Health Care Proxy and Power of Attorney, and keeping a list of medications and system for tracking them. It also discusses how to avoid risk for bone loss.  Biology of Weight Control  Clinical staff conducted group or individual video education with verbal and written material and guidebook.  Patient learns that weight gain occurs because we consume more calories than we burn (eating more, moving less). Even if your body weight is normal, you may have higher ratios of fat compared to muscle mass. Too much body fat puts you at increased risk for cardiovascular disease, heart attack, stroke, type 2 diabetes, and obesity-related cancers. In addition to exercise, following the Pritikin Eating Plan can help reduce your risk.  Decoding Lab Results  Clinical staff conducted group or individual video education with verbal and written material and guidebook.  Patient learns that lab test reflects one measurement whose values change over time and are influenced by many factors, including medication, stress, sleep, exercise, food, hydration, pre-existing medical conditions, and more. It is recommended to use the knowledge from this video to become more involved with your lab results and evaluate your numbers to speak with your doctor.   Diseases of Our Time - Overview  Clinical staff conducted group or individual video education with verbal and written material and guidebook.  Patient learns that according to the CDC, 50% to 70% of chronic diseases (such as obesity, type 2 diabetes, elevated lipids, hypertension, and heart disease)  are avoidable through lifestyle improvements including healthier food choices, listening to satiety cues, and increased physical activity.  Sleep Disorders Clinical staff conducted group or individual video education with verbal and written material and guidebook.  Patient learns how good quality and duration of sleep are important to overall health and well-being. Patient also learns about sleep disorders and how they impact health along with recommendations to address them, including discussing with a physician.  Nutrition  Dining Out - Part 2 Clinical staff conducted group or individual video education with verbal and written material and guidebook.  Patient learns how to plan ahead and communicate in order to maximize their dining experience in a healthy and nutritious manner. Included are recommended food choices based on the type of restaurant the patient is visiting.   Fueling a Banker conducted group or individual video education with verbal and written material and guidebook.  There is a strong connection between our food choices and our health. Diseases like obesity and type 2 diabetes are very prevalent and are in large-part due to lifestyle choices. The Pritikin Eating Plan provides plenty of food and hunger-curbing satisfaction. It is easy to follow, affordable, and helps reduce health risks.  Menu Workshop  Clinical staff conducted group or individual video education with verbal and written material and guidebook.  Patient learns that restaurant meals can sabotage health goals because they are often packed with calories, fat, sodium, and sugar. Recommendations include strategies to plan ahead and to communicate with the manager,  chef, or server to help order a healthier meal.  Planning Your Eating Strategy  Clinical staff conducted group or individual video education with verbal and written material and guidebook.  Patient learns about the Pritikin Eating Plan  and its benefit of reducing the risk of disease. The Pritikin Eating Plan does not focus on calories. Instead, it emphasizes high-quality, nutrient-rich foods. By knowing the characteristics of the foods, we choose, we can determine their calorie density and make informed decisions.  Targeting Your Nutrition Priorities  Clinical staff conducted group or individual video education with verbal and written material and guidebook.  Patient learns that lifestyle habits have a tremendous impact on disease risk and progression. This video provides eating and physical activity recommendations based on your personal health goals, such as reducing LDL cholesterol, losing weight, preventing or controlling type 2 diabetes, and reducing high blood pressure.  Vitamins and Minerals  Clinical staff conducted group or individual video education with verbal and written material and guidebook.  Patient learns different ways to obtain key vitamins and minerals, including through a recommended healthy diet. It is important to discuss all supplements you take with your doctor.   Healthy Mind-Set    Smoking Cessation  Clinical staff conducted group or individual video education with verbal and written material and guidebook.  Patient learns that cigarette smoking and tobacco addiction pose a serious health risk which affects millions of people. Stopping smoking will significantly reduce the risk of heart disease, lung disease, and many forms of cancer. Recommended strategies for quitting are covered, including working with your doctor to develop a successful plan.  Culinary   Becoming a Set designer conducted group or individual video education with verbal and written material and guidebook.  Patient learns that cooking at home can be healthy, cost-effective, quick, and puts them in control. Keys to cooking healthy recipes will include looking at your recipe, assessing your equipment needs, planning  ahead, making it simple, choosing cost-effective seasonal ingredients, and limiting the use of added fats, salts, and sugars.  Cooking - Breakfast and Snacks  Clinical staff conducted group or individual video education with verbal and written material and guidebook.  Patient learns how important breakfast is to satiety and nutrition through the entire day. Recommendations include key foods to eat during breakfast to help stabilize blood sugar levels and to prevent overeating at meals later in the day. Planning ahead is also a key component.  Cooking - Educational psychologist conducted group or individual video education with verbal and written material and guidebook.  Patient learns eating strategies to improve overall health, including an approach to cook more at home. Recommendations include thinking of animal protein as a side on your plate rather than center stage and focusing instead on lower calorie dense options like vegetables, fruits, whole grains, and plant-based proteins, such as beans. Making sauces in large quantities to freeze for later and leaving the skin on your vegetables are also recommended to maximize your experience.  Cooking - Healthy Salads and Dressing Clinical staff conducted group or individual video education with verbal and written material and guidebook.  Patient learns that vegetables, fruits, whole grains, and legumes are the foundations of the Pritikin Eating Plan. Recommendations include how to incorporate each of these in flavorful and healthy salads, and how to create homemade salad dressings. Proper handling of ingredients is also covered. Cooking - Soups and Desserts  Cooking - Soups and Desserts Clinical staff conducted group or  individual video education with verbal and written material and guidebook.  Patient learns that Pritikin soups and desserts make for easy, nutritious, and delicious snacks and meal components that are low in sodium, fat, sugar,  and calorie density, while high in vitamins, minerals, and filling fiber. Recommendations include simple and healthy ideas for soups and desserts.   Overview     The Pritikin Solution Program Overview Clinical staff conducted group or individual video education with verbal and written material and guidebook.  Patient learns that the results of the Pritikin Program have been documented in more than 100 articles published in peer-reviewed journals, and the benefits include reducing risk factors for (and, in some cases, even reversing) high cholesterol, high blood pressure, type 2 diabetes, obesity, and more! An overview of the three key pillars of the Pritikin Program will be covered: eating well, doing regular exercise, and having a healthy mind-set.  WORKSHOPS  Exercise: Exercise Basics: Building Your Action Plan Clinical staff led group instruction and group discussion with PowerPoint presentation and patient guidebook. To enhance the learning environment the use of posters, models and videos may be added. At the conclusion of this workshop, patients will comprehend the difference between physical activity and exercise, as well as the benefits of incorporating both, into their routine. Patients will understand the FITT (Frequency, Intensity, Time, and Type) principle and how to use it to build an exercise action plan. In addition, safety concerns and other considerations for exercise and cardiac rehab will be addressed by the presenter. The purpose of this lesson is to promote a comprehensive and effective weekly exercise routine in order to improve patients' overall level of fitness.   Managing Heart Disease: Your Path to a Healthier Heart Clinical staff led group instruction and group discussion with PowerPoint presentation and patient guidebook. To enhance the learning environment the use of posters, models and videos may be added.At the conclusion of this workshop, patients will understand  the anatomy and physiology of the heart. Additionally, they will understand how Pritikin's three pillars impact the risk factors, the progression, and the management of heart disease.  The purpose of this lesson is to provide a high-level overview of the heart, heart disease, and how the Pritikin lifestyle positively impacts risk factors.  Exercise Biomechanics Clinical staff led group instruction and group discussion with PowerPoint presentation and patient guidebook. To enhance the learning environment the use of posters, models and videos may be added. Patients will learn how the structural parts of their bodies function and how these functions impact their daily activities, movement, and exercise. Patients will learn how to promote a neutral spine, learn how to manage pain, and identify ways to improve their physical movement in order to promote healthy living. The purpose of this lesson is to expose patients to common physical limitations that impact physical activity. Participants will learn practical ways to adapt and manage aches and pains, and to minimize their effect on regular exercise. Patients will learn how to maintain good posture while sitting, walking, and lifting.  Balance Training and Fall Prevention  Clinical staff led group instruction and group discussion with PowerPoint presentation and patient guidebook. To enhance the learning environment the use of posters, models and videos may be added. At the conclusion of this workshop, patients will understand the importance of their sensorimotor skills (vision, proprioception, and the vestibular system) in maintaining their ability to balance as they age. Patients will apply a variety of balancing exercises that are appropriate for their current level  of function. Patients will understand the common causes for poor balance, possible solutions to these problems, and ways to modify their physical environment in order to minimize  their fall risk. The purpose of this lesson is to teach patients about the importance of maintaining balance as they age and ways to minimize their risk of falling.  WORKSHOPS   Nutrition:  Fueling a Ship broker led group instruction and group discussion with PowerPoint presentation and patient guidebook. To enhance the learning environment the use of posters, models and videos may be added. Patients will review the foundational principles of the Pritikin Eating Plan and understand what constitutes a serving size in each of the food groups. Patients will also learn Pritikin-friendly foods that are better choices when away from home and review make-ahead meal and snack options. Calorie density will be reviewed and applied to three nutrition priorities: weight maintenance, weight loss, and weight gain. The purpose of this lesson is to reinforce (in a group setting) the key concepts around what patients are recommended to eat and how to apply these guidelines when away from home by planning and selecting Pritikin-friendly options. Patients will understand how calorie density may be adjusted for different weight management goals.  Mindful Eating  Clinical staff led group instruction and group discussion with PowerPoint presentation and patient guidebook. To enhance the learning environment the use of posters, models and videos may be added. Patients will briefly review the concepts of the Pritikin Eating Plan and the importance of low-calorie dense foods. The concept of mindful eating will be introduced as well as the importance of paying attention to internal hunger signals. Triggers for non-hunger eating and techniques for dealing with triggers will be explored. The purpose of this lesson is to provide patients with the opportunity to review the basic principles of the Pritikin Eating Plan, discuss the value of eating mindfully and how to measure internal cues of hunger and fullness using the  Hunger Scale. Patients will also discuss reasons for non-hunger eating and learn strategies to use for controlling emotional eating.  Targeting Your Nutrition Priorities Clinical staff led group instruction and group discussion with PowerPoint presentation and patient guidebook. To enhance the learning environment the use of posters, models and videos may be added. Patients will learn how to determine their genetic susceptibility to disease by reviewing their family history. Patients will gain insight into the importance of diet as part of an overall healthy lifestyle in mitigating the impact of genetics and other environmental insults. The purpose of this lesson is to provide patients with the opportunity to assess their personal nutrition priorities by looking at their family history, their own health history and current risk factors. Patients will also be able to discuss ways of prioritizing and modifying the Pritikin Eating Plan for their highest risk areas  Menu  Clinical staff led group instruction and group discussion with PowerPoint presentation and patient guidebook. To enhance the learning environment the use of posters, models and videos may be added. Using menus brought in from [employer], or printed from [employer], patients will apply the Pritikin dining out guidelines that were presented in the [employer] video. Patients will also be able to practice these guidelines in a variety of provided scenarios. The purpose of this lesson is to provide patients with the opportunity to practice hands-on learning of the Pritikin Dining Out guidelines with actual menus and practice scenarios.  Label Reading Clinical staff led group instruction and group discussion  with PowerPoint presentation and patient guidebook. To enhance the learning environment the use of posters, models and videos may be added. Patients will review and discuss the Pritikin label reading guidelines  presented in Pritikin's Label Reading Educational series video. Using fool labels brought in from local grocery stores and markets, patients will apply the label reading guidelines and determine if the packaged food meet the Pritikin guidelines. The purpose of this lesson is to provide patients with the opportunity to review, discuss, and practice hands-on learning of the Pritikin Label Reading guidelines with actual packaged food labels. Cooking School  Pritikin's [employer] are designed to teach patients ways to prepare quick, simple, and affordable recipes at home. The importance of nutrition's role in chronic disease risk reduction is reflected in its emphasis in the overall Pritikin program. By learning how to prepare essential core Pritikin Eating Plan recipes, patients will increase control over what they eat; be able to customize the flavor of foods without the use of added salt, sugar, or fat; and improve the quality of the food they consume. By learning a set of core recipes which are easily assembled, quickly prepared, and affordable, patients are more likely to prepare more healthy foods at home. These workshops focus on convenient breakfasts, simple entres, side dishes, and desserts which can be prepared with minimal effort and are consistent with nutrition recommendations for cardiovascular risk reduction. Cooking [employer] are taught by a Armed forces logistics/support/administrative officer (RD) who has been trained by the [employer]. The chef or RD has a clear understanding of the importance of minimizing - if not completely eliminating - added fat, sugar, and sodium in recipes. Throughout the series of Cooking School Workshop sessions, patients will learn about healthy ingredients and efficient methods of cooking to build confidence in their capability to prepare    Cooking School weekly topics:  Adding Flavor- Sodium-Free  Fast and Healthy Breakfasts  Powerhouse Plant-Based  Proteins  Satisfying Salads and Dressings  Simple Sides and Sauces  International Cuisine-Spotlight on the [employer] Zones  Delicious Desserts  Savory Soups  [employer] - Meals in a Astronomer Appetizers and Snacks  Comforting Weekend Breakfasts  One-Pot Wonders   Fast Evening Meals  Landscape architect Your Pritikin Plate  WORKSHOPS   Healthy Mindset (Psychosocial):  Focused Goals, Sustainable Changes Clinical staff led group instruction and group discussion with PowerPoint presentation and patient guidebook. To enhance the learning environment the use of posters, models and videos may be added. Patients will be able to apply effective goal setting strategies to establish at least one personal goal, and then take consistent, meaningful action toward that goal. They will learn to identify common barriers to achieving personal goals and develop strategies to overcome them. Patients will also gain an understanding of how our mind-set can impact our ability to achieve goals and the importance of cultivating a positive and growth-oriented mind-set. The purpose of this lesson is to provide patients with a deeper understanding of how to set and achieve personal goals, as well as the tools and strategies needed to overcome common obstacles which may arise along the way.  From Head to Heart: The Power of a Healthy Outlook  Clinical staff led group instruction and group discussion with PowerPoint presentation and patient guidebook. To enhance the learning environment the use of posters, models and videos may be added. Patients will be able to recognize and describe the impact of emotions and mood on physical health.  They will discover the importance of self-care and explore self-care practices which may work for them. Patients will also learn how to utilize the 4 C's to cultivate a healthier outlook and better manage stress and challenges. The purpose of this lesson is to demonstrate  to patients how a healthy outlook is an essential part of maintaining good health, especially as they continue their cardiac rehab journey.  Healthy Sleep for a Healthy Heart Clinical staff led group instruction and group discussion with PowerPoint presentation and patient guidebook. To enhance the learning environment the use of posters, models and videos may be added. At the conclusion of this workshop, patients will be able to demonstrate knowledge of the importance of sleep to overall health, well-being, and quality of life. They will understand the symptoms of, and treatments for, common sleep disorders. Patients will also be able to identify daytime and nighttime behaviors which impact sleep, and they will be able to apply these tools to help manage sleep-related challenges. The purpose of this lesson is to provide patients with a general overview of sleep and outline the importance of quality sleep. Patients will learn about a few of the most common sleep disorders. Patients will also be introduced to the concept of "sleep hygiene," and discover ways to self-manage certain sleeping problems through simple daily behavior changes. Finally, the workshop will motivate patients by clarifying the links between quality sleep and their goals of heart-healthy living.   Recognizing and Reducing Stress Clinical staff led group instruction and group discussion with PowerPoint presentation and patient guidebook. To enhance the learning environment the use of posters, models and videos may be added. At the conclusion of this workshop, patients will be able to understand the types of stress reactions, differentiate between acute and chronic stress, and recognize the impact that chronic stress has on their health. They will also be able to apply different coping mechanisms, such as reframing negative self-talk. Patients will have the opportunity to practice a variety of stress management techniques, such as deep  abdominal breathing, progressive muscle relaxation, and/or guided imagery.  The purpose of this lesson is to educate patients on the role of stress in their lives and to provide healthy techniques for coping with it.  Learning Barriers/Preferences:  Learning Barriers/Preferences - 02/23/23 0843       Learning Barriers/Preferences   Learning Barriers Hearing   hearing aids   Learning Preferences Audio;Computer/Internet;Group Instruction;Individual Instruction;Skilled Demonstration;Verbal Instruction;Video;Written Material;Pictoral             Education Topics:  Knowledge Questionnaire Score:  Knowledge Questionnaire Score - 02/23/23 0843       Knowledge Questionnaire Score   Pre Score 22/24             Core Components/Risk Factors/Patient Goals at Admission:  Personal Goals and Risk Factors at Admission - 02/23/23 0845       Core Components/Risk Factors/Patient Goals on Admission    Weight Management Yes;Weight Maintenance    Intervention Weight Management: Develop a combined nutrition and exercise program designed to reach desired caloric intake, while maintaining appropriate intake of nutrient and fiber, sodium and fats, and appropriate energy expenditure required for the weight goal.;Weight Management: Provide education and appropriate resources to help participant work on and attain dietary goals.    Expected Outcomes Short Term: Continue to assess and modify interventions until short term weight is achieved;Long Term: Adherence to nutrition and physical activity/exercise program aimed toward attainment of established weight goal;Weight Maintenance: Understanding of the daily nutrition  guidelines, which includes 25-35% calories from fat, 7% or less cal from saturated fats, less than 200mg  cholesterol, less than 1.5gm of sodium, & 5 or more servings of fruits and vegetables daily;Understanding recommendations for meals to include 15-35% energy as protein, 25-35% energy from  fat, 35-60% energy from carbohydrates, less than 200mg  of dietary cholesterol, 20-35 gm of total fiber daily;Understanding of distribution of calorie intake throughout the day with the consumption of 4-5 meals/snacks    Heart Failure Yes    Intervention Provide a combined exercise and nutrition program that is supplemented with education, support and counseling about heart failure. Directed toward relieving symptoms such as shortness of breath, decreased exercise tolerance, and extremity edema.    Expected Outcomes Improve functional capacity of life;Short term: Attendance in program 2-3 days a week with increased exercise capacity. Reported lower sodium intake. Reported increased fruit and vegetable intake. Reports medication compliance.;Short term: Daily weights obtained and reported for increase. Utilizing diuretic protocols set by physician.;Long term: Adoption of self-care skills and reduction of barriers for early signs and symptoms recognition and intervention leading to self-care maintenance.    Hypertension Yes    Intervention Provide education on lifestyle modifcations including regular physical activity/exercise, weight management, moderate sodium restriction and increased consumption of fresh fruit, vegetables, and low fat dairy, alcohol moderation, and smoking cessation.;Monitor prescription use compliance.    Expected Outcomes Short Term: Continued assessment and intervention until BP is < 140/13mm HG in hypertensive participants. < 130/34mm HG in hypertensive participants with diabetes, heart failure or chronic kidney disease.;Long Term: Maintenance of blood pressure at goal levels.    Lipids Yes    Intervention Provide education and support for participant on nutrition & aerobic/resistive exercise along with prescribed medications to achieve LDL 70mg , HDL >40mg .    Expected Outcomes Short Term: Participant states understanding of desired cholesterol values and is compliant with medications  prescribed. Participant is following exercise prescription and nutrition guidelines.;Long Term: Cholesterol controlled with medications as prescribed, with individualized exercise RX and with personalized nutrition plan. Value goals: LDL < 70mg , HDL > 40 mg.             Core Components/Risk Factors/Patient Goals Review:   Goals and Risk Factor Review     Row Name 02/28/23 1440 03/16/23 1531           Core Components/Risk Factors/Patient Goals Review   Personal Goals Review Weight Management/Obesity;Heart Failure;Hypertension;Lipids Weight Management/Obesity;Heart Failure;Hypertension;Lipids      Review Tommy started cardiac rehab on 02/28/23. Tommy did well with exercise. Vital signs were stable. Brendan Holland is doing  well with exercise at cardiac rehab. Vital signs have been stable. Brendan Holland has been increasing his workloads without difficulty.      Expected Outcomes Tommy Will continue to participate in cardiac rehab for exercise, nutrtion and lifestyle modifications. Brendan Holland Will continue to participate in cardiac rehab for exercise, nutrtion and lifestyle modifications.               Core Components/Risk Factors/Patient Goals at Discharge (Final Review):   Goals and Risk Factor Review - 03/16/23 1531       Core Components/Risk Factors/Patient Goals Review   Personal Goals Review Weight Management/Obesity;Heart Failure;Hypertension;Lipids    Review Brendan Holland is doing  well with exercise at cardiac rehab. Vital signs have been stable. Brendan Holland has been increasing his workloads without difficulty.    Expected Outcomes Tommy Will continue to participate in cardiac rehab for exercise, nutrtion and lifestyle modifications.  ITP Comments:  ITP Comments     Row Name 02/23/23 [phone] 02/28/23 1428 03/16/23 1529       ITP Comments Dr. Armanda Magic medical director. Introduction to pritikin education/intensive cardiac rehab. Initial orientation packet reviewed with patient. 30 Day ITP  Review. Tommy started cardiac rehab on 02/28/23. Tommy did well with exercise. 30 Day ITP Review. Brendan Holland has good attendance and participation with exercise at cardiac rehab              Comments: See ITP Comments

## 2023-03-20 NOTE — Telephone Encounter (Signed)
 PAP: Application for Brilinta has been submitted to [employer] [facility] via fax

## 2023-03-20 NOTE — Addendum Note (Signed)
 Addended by: Virl Axe, Jahir Halt L on: 03/20/2023 02:29 PM   Modules accepted: Orders

## 2023-03-20 NOTE — Telephone Encounter (Signed)
   I sent the pt a mychart to let him know

## 2023-03-21 ENCOUNTER — Encounter (HOSPITAL_COMMUNITY)
Admission: RE | Admit: 2023-03-21 | Discharge: 2023-03-21 | Disposition: A | Discharge status: Home / Self Care | Payer: PPO | Source: Ambulatory Visit | Attending: Internal Medicine | Admitting: Internal Medicine

## 2023-03-21 DIAGNOSIS — Z955 Presence of coronary angioplasty implant and graft: Secondary | ICD-10-CM | POA: Diagnosis not present

## 2023-03-21 DIAGNOSIS — I2102 ST elevation (STEMI) myocardial infarction involving left anterior descending coronary artery: Secondary | ICD-10-CM

## 2023-03-23 ENCOUNTER — Encounter (HOSPITAL_COMMUNITY)
Admission: RE | Admit: 2023-03-23 | Discharge: 2023-03-23 | Disposition: A | Discharge status: Home / Self Care | Payer: PPO | Source: Ambulatory Visit | Attending: Internal Medicine | Admitting: Internal Medicine

## 2023-03-23 DIAGNOSIS — I2102 ST elevation (STEMI) myocardial infarction involving left anterior descending coronary artery: Secondary | ICD-10-CM

## 2023-03-23 DIAGNOSIS — Z955 Presence of coronary angioplasty implant and graft: Secondary | ICD-10-CM

## 2023-03-23 NOTE — Progress Notes (Signed)
 Reviewed home exercise guidelines with Brendan Holland including endpoints, temperature precautions, target heart rate and rate of perceived exertion. He is currently walking 20 minutes 4 days/week as his mode of home exercise. He's received clearance from Neila Gear, NP from his cardiologist's office to resume low to moderate intensity resistance training up to 15 lb dumbbells and leg lifts. Will progress workloads gradually as tolerated. He voices understanding of instructions given.  Artist Pais, MS, ACSM CEP

## 2023-03-26 ENCOUNTER — Encounter (HOSPITAL_COMMUNITY)
Admission: RE | Admit: 2023-03-26 | Discharge: 2023-03-26 | Disposition: A | Discharge status: Home / Self Care | Payer: PPO | Source: Ambulatory Visit | Attending: Internal Medicine

## 2023-03-26 DIAGNOSIS — Z955 Presence of coronary angioplasty implant and graft: Secondary | ICD-10-CM

## 2023-03-26 DIAGNOSIS — I2102 ST elevation (STEMI) myocardial infarction involving left anterior descending coronary artery: Secondary | ICD-10-CM

## 2023-03-28 ENCOUNTER — Encounter (HOSPITAL_COMMUNITY)
Admission: RE | Admit: 2023-03-28 | Discharge: 2023-03-28 | Disposition: A | Discharge status: Home / Self Care | Payer: PPO | Source: Ambulatory Visit | Attending: Internal Medicine | Admitting: Internal Medicine

## 2023-03-28 DIAGNOSIS — I2102 ST elevation (STEMI) myocardial infarction involving left anterior descending coronary artery: Secondary | ICD-10-CM

## 2023-03-28 DIAGNOSIS — Z955 Presence of coronary angioplasty implant and graft: Secondary | ICD-10-CM

## 2023-03-30 ENCOUNTER — Encounter (HOSPITAL_COMMUNITY)
Admission: RE | Admit: 2023-03-30 | Discharge: 2023-03-30 | Disposition: A | Discharge status: Home / Self Care | Payer: PPO | Source: Ambulatory Visit | Attending: Internal Medicine | Admitting: Internal Medicine

## 2023-03-30 DIAGNOSIS — Z955 Presence of coronary angioplasty implant and graft: Secondary | ICD-10-CM | POA: Diagnosis not present

## 2023-03-30 DIAGNOSIS — I2102 ST elevation (STEMI) myocardial infarction involving left anterior descending coronary artery: Secondary | ICD-10-CM

## 2023-04-02 ENCOUNTER — Encounter (HOSPITAL_COMMUNITY)
Admission: RE | Admit: 2023-04-02 | Discharge: 2023-04-02 | Disposition: A | Discharge status: Home / Self Care | Payer: PPO | Source: Ambulatory Visit | Attending: Internal Medicine | Admitting: Internal Medicine

## 2023-04-02 DIAGNOSIS — Z955 Presence of coronary angioplasty implant and graft: Secondary | ICD-10-CM | POA: Diagnosis not present

## 2023-04-02 DIAGNOSIS — I2102 ST elevation (STEMI) myocardial infarction involving left anterior descending coronary artery: Secondary | ICD-10-CM

## 2023-04-04 ENCOUNTER — Encounter (HOSPITAL_COMMUNITY): Admission: RE | Admit: 2023-04-04 | Payer: PPO | Source: Ambulatory Visit

## 2023-04-04 ENCOUNTER — Encounter (HOSPITAL_COMMUNITY)
Admission: RE | Admit: 2023-04-04 | Discharge: 2023-04-04 | Disposition: A | Discharge status: Home / Self Care | Source: Ambulatory Visit | Attending: Internal Medicine | Admitting: Internal Medicine

## 2023-04-04 DIAGNOSIS — Z955 Presence of coronary angioplasty implant and graft: Secondary | ICD-10-CM

## 2023-04-04 DIAGNOSIS — I2102 ST elevation (STEMI) myocardial infarction involving left anterior descending coronary artery: Secondary | ICD-10-CM

## 2023-04-06 ENCOUNTER — Encounter (HOSPITAL_COMMUNITY)
Admission: RE | Admit: 2023-04-06 | Discharge: 2023-04-06 | Disposition: A | Discharge status: Home / Self Care | Payer: PPO | Source: Ambulatory Visit | Attending: Internal Medicine | Admitting: Internal Medicine

## 2023-04-06 DIAGNOSIS — Z955 Presence of coronary angioplasty implant and graft: Secondary | ICD-10-CM

## 2023-04-06 DIAGNOSIS — I2102 ST elevation (STEMI) myocardial infarction involving left anterior descending coronary artery: Secondary | ICD-10-CM

## 2023-04-08 ENCOUNTER — Other Ambulatory Visit: Payer: Self-pay | Admitting: Orthopaedic Surgery

## 2023-04-09 ENCOUNTER — Encounter (HOSPITAL_COMMUNITY)
Admission: RE | Admit: 2023-04-09 | Discharge: 2023-04-09 | Disposition: A | Discharge status: Home / Self Care | Payer: PPO | Source: Ambulatory Visit | Attending: Internal Medicine | Admitting: Internal Medicine

## 2023-04-09 DIAGNOSIS — I2102 ST elevation (STEMI) myocardial infarction involving left anterior descending coronary artery: Secondary | ICD-10-CM

## 2023-04-09 DIAGNOSIS — Z955 Presence of coronary angioplasty implant and graft: Secondary | ICD-10-CM

## 2023-04-11 ENCOUNTER — Encounter (HOSPITAL_COMMUNITY)
Admission: RE | Admit: 2023-04-11 | Discharge: 2023-04-11 | Disposition: A | Discharge status: Home / Self Care | Payer: PPO | Source: Ambulatory Visit | Attending: Internal Medicine | Admitting: Internal Medicine

## 2023-04-11 DIAGNOSIS — I2102 ST elevation (STEMI) myocardial infarction involving left anterior descending coronary artery: Secondary | ICD-10-CM

## 2023-04-11 DIAGNOSIS — Z955 Presence of coronary angioplasty implant and graft: Secondary | ICD-10-CM | POA: Diagnosis not present

## 2023-04-12 LAB — LIPID PANEL
Chol/HDL Ratio: 2.7 ratio (ref 0.0–5.0)
Cholesterol, Total: 140 mg/dL (ref 100–199)
HDL: 52 mg/dL (ref >39)
LDL Chol Calc (NIH): 72 mg/dL (ref 0–99)
Triglycerides: 85 mg/dL (ref 0–149)
VLDL Cholesterol Cal: 16 mg/dL (ref 5–40)

## 2023-04-12 LAB — HEPATIC FUNCTION PANEL
ALT: 27 IU/L (ref 0–44)
AST: 29 IU/L (ref 0–40)
Albumin: 4.9 g/dL — ABNORMAL HIGH (ref 3.8–4.8)
Alkaline Phosphatase: 77 IU/L (ref 44–121)
Bilirubin Total: 1.2 mg/dL (ref 0.0–1.2)
Bilirubin, Direct: 0.35 mg/dL (ref 0.00–0.40)
Total Protein: 7.9 g/dL (ref 6.0–8.5)

## 2023-04-13 ENCOUNTER — Encounter (HOSPITAL_COMMUNITY)
Admission: RE | Admit: 2023-04-13 | Discharge: 2023-04-13 | Disposition: A | Discharge status: Home / Self Care | Payer: PPO | Source: Ambulatory Visit | Attending: Internal Medicine | Admitting: Internal Medicine

## 2023-04-13 DIAGNOSIS — Z955 Presence of coronary angioplasty implant and graft: Secondary | ICD-10-CM | POA: Diagnosis not present

## 2023-04-13 DIAGNOSIS — I2102 ST elevation (STEMI) myocardial infarction involving left anterior descending coronary artery: Secondary | ICD-10-CM

## 2023-04-16 ENCOUNTER — Encounter (HOSPITAL_COMMUNITY)
Admission: RE | Admit: 2023-04-16 | Discharge: 2023-04-16 | Disposition: A | Discharge status: Home / Self Care | Payer: PPO | Source: Ambulatory Visit | Attending: Internal Medicine | Admitting: Internal Medicine

## 2023-04-16 DIAGNOSIS — Z955 Presence of coronary angioplasty implant and graft: Secondary | ICD-10-CM | POA: Diagnosis not present

## 2023-04-16 DIAGNOSIS — I2102 ST elevation (STEMI) myocardial infarction involving left anterior descending coronary artery: Secondary | ICD-10-CM

## 2023-04-16 NOTE — Progress Notes (Signed)
 Cardiac Individual Treatment Plan  Patient Details  Name: Brendan Holland MRN: [id number] Date of Birth: [birth year] Referring Provider:   Flowsheet Row INTENSIVE CARDIAC REHAB ORIENT from 02/23/2023 in [facility]  Referring Provider Alverda Skeans, MD       Initial Encounter Date:  Flowsheet Row INTENSIVE CARDIAC REHAB ORIENT from 02/23/2023 in [facility] for Heart, Vascular, & Lung [facility]  Date 02/23/23       Visit Diagnosis: 02/03/23 STEMI  02/03/23 DES LAD  Patient's Home Medications on Admission:  Current Outpatient Medications:    aspirin EC 81 MG tablet, Take 81 mg by mouth at bedtime., Disp: , Rfl:    atorvastatin (LIPITOR) 80 MG tablet, Take 1 tablet (80 mg total) by mouth daily., Disp: 30 tablet, Rfl: 6   donepezil (ARICEPT) 10 MG tablet, TAKE 1 TABLET BY MOUTH DAILY (Patient taking differently: Take 10 mg by mouth at bedtime.), Disp: 90 tablet, Rfl: 2   empagliflozin (JARDIANCE) 10 MG TABS tablet, Take 1 tablet (10 mg total) by mouth daily before breakfast., Disp: 90 tablet, Rfl: 3   GLUCOSAMINE CHONDROITIN COMPLX PO, Take 1 tablet by mouth at bedtime., Disp: , Rfl:    metoprolol succinate (TOPROL XL) 25 MG 24 hr tablet, Take 1 tablet (25 mg total) by mouth daily., Disp: 90 tablet, Rfl: 3   Multiple Vitamins-Minerals (MULTIVITAMIN WITH MINERALS) tablet, Take 1 tablet by mouth at bedtime., Disp: , Rfl:    Omega-3 Fatty Acids (FISH OIL PO), Take 1 capsule by mouth at bedtime., Disp: , Rfl:    pantoprazole (PROTONIX) 40 MG tablet, Take 1 tablet (40 mg total) by mouth daily., Disp: 30 tablet, Rfl: 6   spironolactone (ALDACTONE) 25 MG tablet, Take 0.5 tablets (12.5 mg total) by mouth daily. (Patient taking differently: Take 25 mg by mouth daily.), Disp: 15 tablet, Rfl: 6   ticagrelor (BRILINTA) 90 MG TABS tablet, Take 1 tablet (90 mg total) by mouth 2 (two) times daily., Disp: 60 tablet, Rfl: 6    traZODone (DESYREL) 50 MG tablet, Take 50 mg by mouth at bedtime. (Patient taking differently: Take 25 mg by mouth at bedtime.), Disp: , Rfl:   Past Medical History: Past Medical History:  Diagnosis Date   Arthritis    "arthritis right knee"   Cancer (HCC)    "skin cancer of scalp" -tx with topical meds-"all clear now"   Dementia (HCC)    "pre Alzheimers" "[facility]"conitive impairment.  Negative on latest testing   DVT of lower extremity (deep venous thrombosis) (HCC)    GERD (gastroesophageal reflux disease)    Headache(784.0)    Heart rate slow    Avid runner" 30 miles per week"   Hepatitis C    Tx. [facility]- 3 yrs ago- "now Clear"   Sleep apnea    no cpap use today"condition improved".  Most recent study neg   Stroke (HCC)     Tobacco Use: Social History   Tobacco Use  Smoking Status Former   Current packs/day: 0.00   Types: Cigarettes   Quit date: 12/20/1982   Years since quitting: 40.3  Smokeless Tobacco Never    Labs: Review Flowsheet       Latest Ref Rng & Units 02/03/2023 02/05/2023 04/12/2023  Labs for ITP Cardiac and Pulmonary Rehab  Cholestrol 100 - 199 mg/dL 914  - 782   LDL (calc) 0 - 99 mg/dL 88  - 72   HDL-C >95 mg/dL  40  - 52   Trlycerides 0 - 149 mg/dL 53  - 85   Hemoglobin J1O 4.8 - 5.6 % 5.3  - -  PH, Arterial 7.35 - 7.45 - 7.271  -  PCO2 arterial 32 - 48 mmHg - 41.2  -  Bicarbonate 20.0 - 28.0 mmol/L - 19.0  -  TCO2 22 - 32 mmol/L - 20  -  Acid-base deficit 0.0 - 2.0 mmol/L - 8.0  -  O2 Saturation % - 96  -    Capillary Blood Glucose: No results found for: "GLUCAP"   Exercise Target Goals: Exercise Program Goal: Individual exercise prescription set using results from initial 6 min walk test and THRR while considering  patient's activity barriers and safety.   Exercise Prescription Goal: Initial exercise prescription builds to 30-45 minutes a day of aerobic activity, 2-3 days per week.  Home exercise guidelines will be given to patient during  program as part of exercise prescription that the participant will acknowledge.  Activity Barriers & Risk Stratification:  Activity Barriers & Cardiac Risk Stratification - 02/23/23 0841       Activity Barriers & Cardiac Risk Stratification   Activity Barriers Arthritis;Right Knee Replacement;Decreased Ventricular Function    Cardiac Risk Stratification High   <5 METs on            6 Minute Walk:  6 Minute Walk     Row Name 02/23/23 0958         6 Minute Walk   Phase Initial     Distance 1200 feet     Walk Time 6 minutes     # of Rest Breaks 0     MPH 2.27     METS 2.67     RPE 10     Perceived Dyspnea  0     VO2 Peak 9.35     Symptoms No     Resting HR 64 bpm     Resting BP 122/64     Resting Oxygen Saturation  98 %     Exercise Oxygen Saturation  during 6 min walk 100 %     Max Ex. HR 75 bpm     Max Ex. BP 138/72     2 Minute Post BP 118/68              Oxygen Initial Assessment:   Oxygen Re-Evaluation:   Oxygen Discharge (Final Oxygen Re-Evaluation):   Initial Exercise Prescription:  Initial Exercise Prescription - 02/23/23 0900       Date of Initial Exercise RX and Referring Provider   Date 02/23/23    Referring Provider Alverda Skeans, MD    Expected Discharge Date 05/16/23      Treadmill   MPH 2    Grade 0    Minutes 15    METs 2.5      NuStep   Level 2    SPM 60    Minutes 15    METs 2.5      Prescription Details   Frequency (times per week) 3    Duration Progress to 30 minutes of continuous aerobic without signs/symptoms of physical distress      Intensity   THRR 40-80% of Max Heartrate 58-117    Ratings of Perceived Exertion 11-13    Perceived Dyspnea 0-4      Progression   Progression Continue progressive overload as per policy without signs/symptoms or physical distress.      Paramedic  Prescription Yes    Weight 4    Reps 10-15             Perform Capillary Blood Glucose checks as  needed.  Exercise Prescription Changes:   Exercise Prescription Changes     Row Name 02/28/23 1200 03/05/23 1029 03/19/23 1027 04/02/23 1030 04/16/23 1017     Response to Exercise   Blood Pressure (Admit) 100/60 118/62 120/62 120/60 118/70   Blood Pressure (Exercise) 120/70 128/78 -- -- --   Blood Pressure (Exit) 120/80 102/64 106/60 138/80 110/60   Heart Rate (Admit) 85 bpm 55 bpm 50 bpm 54 bpm 53 bpm   Heart Rate (Exercise) 108 bpm 108 bpm 100 bpm 120 bpm 125 bpm   Heart Rate (Exit) 94 bpm 68 bpm 59 bpm 68 bpm 65 bpm   Rating of Perceived Exertion (Exercise) 11 10 10 11 11    Symptoms None None None None None   Comments Pt's first day in the CRP2 program -- Reviewed goals with patient. Increased workloads on TM and NuStep. Increased hand weights. -- Reviewed METs with Tommy.   Duration Continue with 30 min of aerobic exercise without signs/symptoms of physical distress. Continue with 30 min of aerobic exercise without signs/symptoms of physical distress. Continue with 30 min of aerobic exercise without signs/symptoms of physical distress. Continue with 30 min of aerobic exercise without signs/symptoms of physical distress. Continue with 30 min of aerobic exercise without signs/symptoms of physical distress.   Intensity THRR unchanged THRR unchanged THRR unchanged THRR unchanged THRR unchanged     Progression   Progression Continue to progress workloads to maintain intensity without signs/symptoms of physical distress. Continue to progress workloads to maintain intensity without signs/symptoms of physical distress. Continue to progress workloads to maintain intensity without signs/symptoms of physical distress. Continue to progress workloads to maintain intensity without signs/symptoms of physical distress. Continue to progress workloads to maintain intensity without signs/symptoms of physical distress.   Average METs 3.1 3.4 4.1 4.8 4.9     Resistance Training   Training Prescription No  Yes Yes Yes Yes   Weight No weights on Wednesdays 4 lbs 5 lbs 6 lbs 6 lbs   Reps -- 10-15 10-15 10-15 10-15   Time -- 10 Minutes 10 Minutes 10 Minutes 10 Minutes     Interval Training   Interval Training No No No No No     Treadmill   MPH 2.5 2.5 3.2 3.5 3.5   Grade 0 0 1.5 2 2    Minutes 15 15 15 15 15    METs 2.91 2.91 4.11 4.65 4.65     NuStep   Level 2 3 5 6 6    SPM 112 112 113 115 123   Minutes 15 15 15 15 15    METs 3.9 4 4.1 5 5.1     Home Exercise Plan   Plans to continue exercise at -- -- Home (comment)  Walking Home (comment)  Walking Home (comment)  Walking   Frequency -- -- Add 3 additional days to program exercise sessions. Add 3 additional days to program exercise sessions. Add 3 additional days to program exercise sessions.   Initial Home Exercises Provided -- -- -- 03/23/23 03/23/23            Exercise Comments:   Exercise Comments     Row Name 02/28/23 1206 03/19/23 1059 03/23/23 1035 04/16/23 1058     Exercise Comments Pt's first day in the CRP2 program. Pt did well with no complaints. Increased  workload on treadmill as pt felt it was too easy. Reviewed goals with Tommy. Increased workloads on the treadmill and Nustep. Increased hand weights to 5 lbs. Reviewed home exercise guideines and goals with Tommy. Reviewed METs with Tommy.             Exercise Goals and Review:   Exercise Goals     Row Name 02/23/23 0841             Exercise Goals   Increase Physical Activity Yes       Intervention Provide advice, education, support and counseling about physical activity/exercise needs.;Develop an individualized exercise prescription for aerobic and resistive training based on initial evaluation findings, risk stratification, comorbidities and participant's personal goals.       Expected Outcomes Short Term: Attend rehab on a regular basis to increase amount of physical activity.;Long Term: Exercising regularly at least 3-5 days a week.;Long Term: Add  in home exercise to make exercise part of routine and to increase amount of physical activity.       Increase Strength and Stamina Yes       Intervention Provide advice, education, support and counseling about physical activity/exercise needs.;Develop an individualized exercise prescription for aerobic and resistive training based on initial evaluation findings, risk stratification, comorbidities and participant's personal goals.       Expected Outcomes Short Term: Increase workloads from initial exercise prescription for resistance, speed, and METs.;Short Term: Perform resistance training exercises routinely during rehab and add in resistance training at home;Long Term: Improve cardiorespiratory fitness, muscular endurance and strength as measured by increased METs and functional capacity ( )       Able to understand and use rate of perceived exertion (RPE) scale Yes       Intervention Provide education and explanation on how to use RPE scale       Expected Outcomes Short Term: Able to use RPE daily in rehab to express subjective intensity level;Long Term:  Able to use RPE to guide intensity level when exercising independently       Knowledge and understanding of Target Heart Rate Range (THRR) Yes       Intervention Provide education and explanation of THRR including how the numbers were predicted and where they are located for reference       Expected Outcomes Short Term: Able to state/look up THRR;Short Term: Able to use daily as guideline for intensity in rehab;Long Term: Able to use THRR to govern intensity when exercising independently       Understanding of Exercise Prescription Yes       Intervention Provide education, explanation, and written materials on patient's individual exercise prescription       Expected Outcomes Short Term: Able to explain program exercise prescription;Long Term: Able to explain home exercise prescription to exercise independently                Exercise  Goals Re-Evaluation :  Exercise Goals Re-Evaluation     Row Name 02/28/23 1205 03/19/23 1059 03/23/23 1035         Exercise Goal Re-Evaluation   Exercise Goals Review Increase Physical Activity;Understanding of Exercise Prescription;Increase Strength and Stamina;Knowledge and understanding of Target Heart Rate Range (THRR);Able to understand and use rate of perceived exertion (RPE) scale Increase Physical Activity;Understanding of Exercise Prescription;Increase Strength and Stamina;Knowledge and understanding of Target Heart Rate Range (THRR);Able to understand and use rate of perceived exertion (RPE) scale Increase Physical Activity;Understanding of Exercise Prescription;Increase Strength and Stamina;Knowledge and understanding of Target  Heart Rate Range (THRR);Able to understand and use rate of perceived exertion (RPE) scale     Comments Pt's first day in the CRP2 program. Pt understands the exercise Rx, RPE scale and THRR. Brendan Holland continues to increase workloads appropriately at cardiac rehab. He is walking at home 20 minutes 4 days/week. His goal is to increase his endurance and stamina. He would like to resume body weight exercises, such as leg lifts and sit-ups. I instructed him to check with his cardiologist to see if he has any lifting restrictions before resuming body weight exercises, and he is agreeable. Reviewed exercise prescription with Tommy. He received clearance to return to low to moderate intensity resistance training up to 15 lb dumb bells and leg lifts from Robin Searing, Montez Hageman, NP. His goal is to get back to running and resume his usual routine. He has a meeting at [facility] to check out the facility and may exercise there as well.     Expected Outcomes Will continue to monitor the patient and increase exercise workloads as tolerated. Continue to progress workloads as tolerated to help increase endurance and stamina. Continue daily exercise at low to moderate intensity. Progress  workloads gradually as tolerated.              Discharge Exercise Prescription (Final Exercise Prescription Changes):  Exercise Prescription Changes - 04/16/23 1017       Response to Exercise   Blood Pressure (Admit) 118/70    Blood Pressure (Exit) 110/60    Heart Rate (Admit) 53 bpm    Heart Rate (Exercise) 125 bpm    Heart Rate (Exit) 65 bpm    Rating of Perceived Exertion (Exercise) 11    Symptoms None    Comments Reviewed METs with Tommy.    Duration Continue with 30 min of aerobic exercise without signs/symptoms of physical distress.    Intensity THRR unchanged      Progression   Progression Continue to progress workloads to maintain intensity without signs/symptoms of physical distress.    Average METs 4.9      Resistance Training   Training Prescription Yes    Weight 6 lbs    Reps 10-15    Time 10 Minutes      Interval Training   Interval Training No      Treadmill   MPH 3.5    Grade 2    Minutes 15    METs 4.65      NuStep   Level 6    SPM 123    Minutes 15    METs 5.1      Home Exercise Plan   Plans to continue exercise at Home (comment)   Walking   Frequency Add 3 additional days to program exercise sessions.    Initial Home Exercises Provided 03/23/23             Nutrition:  Target Goals: Understanding of nutrition guidelines, daily intake of sodium 1500mg , cholesterol 200mg , calories 30% from fat and 7% or less from saturated fats, daily to have 5 or more servings of fruits and vegetables.  Biometrics:  Pre Biometrics - 02/23/23 0835       Pre Biometrics   Waist Circumference 34 inches    Hip Circumference 37 inches    Waist to Hip Ratio 0.92 %    Triceps Skinfold 10 mm    % Body Fat 42 %    Grip Strength 42 kg    Flexibility 15 in    Single  Leg Stand 17.06 seconds              Nutrition Therapy Plan and Nutrition Goals:  Nutrition Therapy & Goals - 04/13/23 1026       Nutrition Therapy   Diet heart healthy diet     Drug/Food Interactions Statins/Certain Fruits      Personal Nutrition Goals   Nutrition Goal Patient to identify strategies for reducing cardiovascular risk by attending the [facility] education and nutrition series weekly.   goal not met.   Personal Goal #2 Patient to improve diet quality by using the plate method as a guide for meal planning to include lean protein/plant protein, fruits, vegetables, whole grains, nonfat dairy as part of a well-balanced diet.   goal in progress.   Comments Goals in progress. Elis has medical history of STEMI, HTN, hyperlipidemia, dementia. His blood pressure remains well controlled. Lipids WNL, LDL has improved to 72. He is down 2.2# since starting with our program. He does not regulalrly attend the [facility] education/nutrition series.. Patient will benefit from participation in intensive cardiac rehab for nutrition, exercise, and lifestyle modification.      Intervention Plan   Intervention Prescribe, educate and counsel regarding individualized specific dietary modifications aiming towards targeted core components such as weight, hypertension, lipid management, diabetes, heart failure and other comorbidities.;Nutrition handout(s) given to patient.    Expected Outcomes Short Term Goal: Understand basic principles of dietary content, such as calories, fat, sodium, cholesterol and nutrients.;Long Term Goal: Adherence to prescribed nutrition plan.             Nutrition Assessments:  MEDIFICTS Score Key: >=70 Need to make dietary changes  40-70 Heart Healthy Diet <= 40 Therapeutic Level Cholesterol Diet   Flowsheet Row INTENSIVE CARDIAC REHAB from 02/28/2023 in [facility] for Heart, Vascular, & Lung Health  Picture Your Plate Total Score on Admission 56      Picture Your Plate Scores: <45 Unhealthy dietary pattern with much room for improvement. 41-50 Dietary pattern unlikely to meet recommendations for good health and room for  improvement. 51-60 More healthful dietary pattern, with some room for improvement.  >60 Healthy dietary pattern, although there may be some specific behaviors that could be improved.    Nutrition Goals Re-Evaluation:  Nutrition Goals Re-Evaluation     Row Name 03/15/23 0845 04/13/23 1026           Goals   Current Weight 171 lb 4.8 oz (77.7 kg) 168 lb 14 oz (76.6 kg)      Comment LDL 88, HDL 40, Lpa WNL, A1c WNL LDL 72, HDL 52; other most recentl labs A1c WNL, Lpa WNL      Expected Outcome Goals in progress. Hurley has medical history of STEMI, HTN, hyperlipidemia, dementia. His blood pressure remains well controlled. Lipids WNL, LDL 88. He has maintained his weight since starting with our program. He has attended 3 [facility] education sessions. Patient will benefit from participation in intensive cardiac rehab for nutrition, exercise, and lifestyle modification.l Goals in progress. Senan has medical history of STEMI, HTN, hyperlipidemia, dementia. His blood pressure remains well controlled. Lipids WNL, LDL has improved to 72. He is down 2.2# since starting with our program. He does not regulalrly attend the [facility] education/nutrition series.. Patient will benefit from participation in intensive cardiac rehab for nutrition, exercise, and lifestyle modification.               Nutrition Goals Re-Evaluation:  Nutrition Goals Re-Evaluation  Row Name 03/15/23 0845 04/13/23 1026           Goals   Current Weight 171 lb 4.8 oz (77.7 kg) 168 lb 14 oz (76.6 kg)      Comment LDL 88, HDL 40, Lpa WNL, A1c WNL LDL 72, HDL 52; other most recentl labs A1c WNL, Lpa WNL      Expected Outcome Goals in progress. Ion has medical history of STEMI, HTN, hyperlipidemia, dementia. His blood pressure remains well controlled. Lipids WNL, LDL 88. He has maintained his weight since starting with our program. He has attended 3 [facility] education sessions. Patient will benefit from participation in  intensive cardiac rehab for nutrition, exercise, and lifestyle modification.l Goals in progress. Daune has medical history of STEMI, HTN, hyperlipidemia, dementia. His blood pressure remains well controlled. Lipids WNL, LDL has improved to 72. He is down 2.2# since starting with our program. He does not regulalrly attend the [facility] education/nutrition series.. Patient will benefit from participation in intensive cardiac rehab for nutrition, exercise, and lifestyle modification.               Nutrition Goals Discharge (Final Nutrition Goals Re-Evaluation):  Nutrition Goals Re-Evaluation - 04/13/23 1026       Goals   Current Weight 168 lb 14 oz (76.6 kg)    Comment LDL 72, HDL 52; other most recentl labs A1c WNL, Lpa WNL    Expected Outcome Goals in progress. Izaya has medical history of STEMI, HTN, hyperlipidemia, dementia. His blood pressure remains well controlled. Lipids WNL, LDL has improved to 72. He is down 2.2# since starting with our program. He does not regulalrly attend the [facility] education/nutrition series.. Patient will benefit from participation in intensive cardiac rehab for nutrition, exercise, and lifestyle modification.             Psychosocial: Target Goals: Acknowledge presence or absence of significant depression and/or stress, maximize coping skills, provide positive support system. Participant is able to verbalize types and ability to use techniques and skills needed for reducing stress and depression.  Initial Review & Psychosocial Screening:  Initial Psych Review & Screening - 02/23/23 0845       Initial Review   Current issues with None Identified      Family Dynamics   Good Support System? Yes   Wife for support   Comments Brendan Holland denies any feelings of depression/stress/anxiety. However, he shared that he has some days when he feels down because he is unable to participate in former activities and is starting to get bored at home. Brendan Holland is eager to  exercise and begin CRP2.      Barriers   Psychosocial barriers to participate in program There are no identifiable barriers or psychosocial needs.      Screening Interventions   Interventions Encouraged to exercise;Provide feedback about the scores to participant    Expected Outcomes Long Term goal: The participant improves quality of Life and PHQ9 Scores as seen by post scores and/or verbalization of changes;Short Term goal: Identification and review with participant of any Quality of Life or Depression concerns found by scoring the questionnaire.             Quality of Life Scores:  Quality of Life - 02/23/23 1012       Quality of Life   Select Quality of Life      Quality of Life Scores   Health/Function Pre 27.47 %    Socioeconomic Pre 28 %    Psych/Spiritual Pre  30 %    Family Pre 28.8 %    GLOBAL Pre 28.25 %            Scores of 19 and below usually indicate a poorer quality of life in these areas.  A difference of  2-3 points is a clinically meaningful difference.  A difference of 2-3 points in the total score of the Quality of Life Index has been associated with significant improvement in overall quality of life, self-image, physical symptoms, and general health in studies assessing change in quality of life.  PHQ-9: Review Flowsheet       02/23/2023  Depression screen PHQ 2/9  Decreased Interest 1  Down, Depressed, Hopeless 1  PHQ - 2 Score 2  Altered sleeping 0  Tired, decreased energy 1  Change in appetite 0  Feeling bad or failure about yourself  0  Trouble concentrating 0  Moving slowly or fidgety/restless 0  Suicidal thoughts 0  PHQ-9 Score 3  Difficult doing work/chores Not difficult at all   Interpretation of Total Score  Total Score Depression Severity:  1-4 = Minimal depression, 5-9 = Mild depression, 10-14 = Moderate depression, 15-19 = Moderately severe depression, 20-27 = Severe depression   Psychosocial Evaluation and  Intervention:   Psychosocial Re-Evaluation:  Psychosocial Re-Evaluation     Row Name 02/28/23 1435 03/16/23 1530 04/17/23 0737         Psychosocial Re-Evaluation   Current issues with None Identified None Identified None Identified     Interventions Encouraged to attend Cardiac Rehabilitation for the exercise Encouraged to attend Cardiac Rehabilitation for the exercise Encouraged to attend Cardiac Rehabilitation for the exercise     Continue Psychosocial Services  No Follow up required No Follow up required No Follow up required              Psychosocial Discharge (Final Psychosocial Re-Evaluation):  Psychosocial Re-Evaluation - 04/17/23 0737       Psychosocial Re-Evaluation   Current issues with None Identified    Interventions Encouraged to attend Cardiac Rehabilitation for the exercise    Continue Psychosocial Services  No Follow up required             Vocational Rehabilitation: Provide vocational rehab assistance to qualifying candidates.   Vocational Rehab Evaluation & Intervention:  Vocational Rehab - 02/23/23 0844       Initial Vocational Rehab Evaluation & Intervention   Assessment shows need for Vocational [facility]   Brendan Holland is retired            Education: Education Goals: Education classes will be provided on a weekly basis, covering required topics. Participant will state understanding/return demonstration of topics presented.    Education     Row Name 02/28/23 1000     Education   Cardiac Education Topics Pritikin   Customer service manager   Weekly Topic Adding Flavor - Sodium-Free   Instruction Review Code 1- Verbalizes Understanding   Class Start Time 1145   Class Stop Time 1225   Class Time Calculation (min) 40 min    Row Name 03/02/23 1500     Education   Cardiac Education Topics Pritikin   Psychologist, sport and exercise     Core Videos   Educator Dietitian   Select Nutrition   Nutrition  Overview of the Pritikin Eating Plan   Instruction Review Code 1- Verbalizes Understanding   Class Start Time 1145   Class Stop Time  1238   Class Time Calculation (min) 53 min    Row Name 03/09/23 1200     Education   Cardiac Education Topics Pritikin   Select Core Videos     Core Videos   Educator Dietitian   Select Nutrition   Nutrition Other  Label Reading   Instruction Review Code 1- Verbalizes Understanding   Class Start Time 1152   Class Stop Time 1233   Class Time Calculation (min) 41 min    Row Name 03/19/23 1100     Education   Cardiac Education Topics Pritikin   Select Workshops     Workshops   Educator Exercise Physiologist   Select Psychosocial   Psychosocial Workshop Recognizing and Reducing Stress   Instruction Review Code 1- Verbalizes Understanding   Class Start Time 1145   Class Stop Time 1228   Class Time Calculation (min) 43 min    Row Name 03/21/23 1400     Education   Cardiac Education Topics --   Select --     Hospital doctor --   Weekly Topic --   Instruction Review Code --   Class Start Time --   Class Stop Time --   Class Time Calculation (min) --    Row Name 03/30/23 1200     Education   Cardiac Education Topics Pritikin   Psychologist, forensic Exercise Education   Exercise Education Move It!   Instruction Review Code 1- Verbalizes Understanding   Class Start Time 1146   Class Stop Time 1222   Class Time Calculation (min) 36 min            Core Videos: Exercise    Move It!  Clinical staff conducted group or individual video education with verbal and written material and guidebook.  Patient learns the recommended Pritikin exercise program. Exercise with the goal of living a long, healthy life. Some of the health benefits of exercise include controlled diabetes, healthier blood pressure levels, improved cholesterol levels, improved heart and lung  capacity, improved sleep, and better body composition. Everyone should speak with their doctor before starting or changing an exercise routine.  Biomechanical Limitations Clinical staff conducted group or individual video education with verbal and written material and guidebook.  Patient learns how biomechanical limitations can impact exercise and how we can mitigate and possibly overcome limitations to have an impactful and balanced exercise routine.  Body Composition Clinical staff conducted group or individual video education with verbal and written material and guidebook.  Patient learns that body composition (ratio of muscle mass to fat mass) is a key component to assessing overall fitness, rather than body weight alone. Increased fat mass, especially visceral belly fat, can put [place] at increased risk for metabolic syndrome, type 2 diabetes, heart disease, and even death. It is recommended to combine diet and exercise (cardiovascular and resistance training) to improve your body composition. Seek guidance from your physician and exercise physiologist before implementing an exercise routine.  Exercise Action Plan Clinical staff conducted group or individual video education with verbal and written material and guidebook.  Patient learns the recommended strategies to achieve and enjoy long-term exercise adherence, including variety, self-motivation, self-efficacy, and positive decision making. Benefits of exercise include fitness, good health, weight management, more energy, better sleep, less stress, and overall well-being.  Medical   Heart Disease Risk Reduction Clinical staff conducted group or individual video education with  verbal and written material and guidebook.  Patient learns our heart is our most vital organ as it circulates oxygen, nutrients, white blood cells, and hormones throughout the entire body, and carries waste away. Data supports a plant-based eating plan like the Pritikin  Program for its effectiveness in slowing progression of and reversing heart disease. The video provides a number of recommendations to address heart disease.   Metabolic Syndrome and Belly Fat  Clinical staff conducted group or individual video education with verbal and written material and guidebook.  Patient learns what metabolic syndrome is, how it leads to heart disease, and how one can reverse it and keep it from coming back. You have metabolic syndrome if you have 3 of the following 5 criteria: abdominal obesity, high blood pressure, high triglycerides, low HDL cholesterol, and high blood sugar.  Hypertension and Heart Disease Clinical staff conducted group or individual video education with verbal and written material and guidebook.  Patient learns that high blood pressure, or hypertension, is very common in the [place]. Hypertension is largely due to excessive salt intake, but other important risk factors include being overweight, physical inactivity, drinking too much alcohol, smoking, and not eating enough potassium from fruits and vegetables. High blood pressure is a leading risk factor for heart attack, stroke, congestive heart failure, dementia, kidney failure, and premature death. Long-term effects of excessive salt intake include stiffening of the arteries and thickening of heart muscle and organ damage. Recommendations include ways to reduce hypertension and the risk of heart disease.  Diseases of Our Time - Focusing on Diabetes Clinical staff conducted group or individual video education with verbal and written material and guidebook.  Patient learns why the best way to stop diseases of our time is prevention, through food and other lifestyle changes. Medicine (such as prescription pills and surgeries) is often only a Band-Aid on the problem, not a long-term solution. Most common diseases of our time include obesity, type 2 diabetes, hypertension, heart disease, and cancer. The  Pritikin Program is recommended and has been proven to help reduce, reverse, and/or prevent the damaging effects of metabolic syndrome.  Nutrition   Overview of the Pritikin Eating Plan  Clinical staff conducted group or individual video education with verbal and written material and guidebook.  Patient learns about the Pritikin Eating Plan for disease risk reduction. The Pritikin Eating Plan emphasizes a wide variety of unrefined, minimally-processed carbohydrates, like fruits, vegetables, whole grains, and legumes. Go, Caution, and Stop food choices are explained. Plant-based and lean animal proteins are emphasized. Rationale provided for low sodium intake for blood pressure control, low added sugars for blood sugar stabilization, and low added fats and oils for coronary artery disease risk reduction and weight management.  Calorie Density  Clinical staff conducted group or individual video education with verbal and written material and guidebook.  Patient learns about calorie density and how it impacts the Pritikin Eating Plan. Knowing the characteristics of the food you choose will help you decide whether those foods will lead to weight gain or weight loss, and whether you want to consume more or less of them. Weight loss is usually a side effect of the Pritikin Eating Plan because of its focus on low calorie-dense foods.  Label Reading  Clinical staff conducted group or individual video education with verbal and written material and guidebook.  Patient learns about the Pritikin recommended label reading guidelines and corresponding recommendations regarding calorie density, added sugars, sodium content, and whole grains.  Dining Out -  Part 1  Clinical staff conducted group or individual video education with verbal and written material and guidebook.  Patient learns that restaurant meals can be sabotaging because they can be so high in calories, fat, sodium, and/or sugar. Patient learns  recommended strategies on how to positively address this and avoid unhealthy pitfalls.  Facts on Fats  Clinical staff conducted group or individual video education with verbal and written material and guidebook.  Patient learns that lifestyle modifications can be just as effective, if not more so, as many medications for lowering your risk of heart disease. A Pritikin lifestyle can help to reduce your risk of inflammation and atherosclerosis (cholesterol build-up, or plaque, in the artery walls). Lifestyle interventions such as dietary choices and physical activity address the cause of atherosclerosis. A review of the types of fats and their impact on blood cholesterol levels, along with dietary recommendations to reduce fat intake is also included.  Nutrition Action Plan  Clinical staff conducted group or individual video education with verbal and written material and guidebook.  Patient learns how to incorporate Pritikin recommendations into their lifestyle. Recommendations include planning and keeping personal health goals in mind as an important part of their success.  Healthy Mind-Set    Healthy Minds, Bodies, Hearts  Clinical staff conducted group or individual video education with verbal and written material and guidebook.  Patient learns how to identify when they are stressed. Video will discuss the impact of that stress, as well as the many benefits of stress management. Patient will also be introduced to stress management techniques. The way we think, act, and feel has an impact on our hearts.  How Our Thoughts Can Heal Our Hearts  Clinical staff conducted group or individual video education with verbal and written material and guidebook.  Patient learns that negative thoughts can cause depression and anxiety. This can result in negative lifestyle behavior and serious health problems. Cognitive behavioral therapy is an effective method to help control our thoughts in order to change and  improve our emotional outlook.  Additional Videos:  Exercise    Improving Performance  Clinical staff conducted group or individual video education with verbal and written material and guidebook.  Patient learns to use a non-linear approach by alternating intensity levels and lengths of time spent exercising to help burn more calories and lose more body fat. Cardiovascular exercise helps improve heart health, metabolism, hormonal balance, blood sugar control, and recovery from fatigue. Resistance training improves strength, endurance, balance, coordination, reaction time, metabolism, and muscle mass. Flexibility exercise improves circulation, posture, and balance. Seek guidance from your physician and exercise physiologist before implementing an exercise routine and learn your capabilities and proper form for all exercise.  Introduction to Yoga  Clinical staff conducted group or individual video education with verbal and written material and guidebook.  Patient learns about yoga, a discipline of the coming together of mind, breath, and body. The benefits of yoga include improved flexibility, improved range of motion, better posture and core strength, increased lung function, weight loss, and positive self-image. Yoga's heart health benefits include lowered blood pressure, healthier heart rate, decreased cholesterol and triglyceride levels, improved immune function, and reduced stress. Seek guidance from your physician and exercise physiologist before implementing an exercise routine and learn your capabilities and proper form for all exercise.  Medical   Aging: Enhancing Your Quality of Life  Clinical staff conducted group or individual video education with verbal and written material and guidebook.  Patient learns key strategies and  recommendations to stay in good physical health and enhance quality of life, such as prevention strategies, having an advocate, securing a Health Care Proxy and Power of  Attorney, and keeping a list of medications and system for tracking them. It also discusses how to avoid risk for bone loss.  Biology of Weight Control  Clinical staff conducted group or individual video education with verbal and written material and guidebook.  Patient learns that weight gain occurs because we consume more calories than we burn (eating more, moving less). Even if your body weight is normal, you may have higher ratios of fat compared to muscle mass. Too much body fat puts you at increased risk for cardiovascular disease, heart attack, stroke, type 2 diabetes, and obesity-related cancers. In addition to exercise, following the Pritikin Eating Plan can help reduce your risk.  Decoding Lab Results  Clinical staff conducted group or individual video education with verbal and written material and guidebook.  Patient learns that lab test reflects one measurement whose values change over time and are influenced by many factors, including medication, stress, sleep, exercise, food, hydration, pre-existing medical conditions, and more. It is recommended to use the knowledge from this video to become more involved with your lab results and evaluate your numbers to speak with your doctor.   Diseases of Our Time - Overview  Clinical staff conducted group or individual video education with verbal and written material and guidebook.  Patient learns that according to the CDC, 50% to 70% of chronic diseases (such as obesity, type 2 diabetes, elevated lipids, hypertension, and heart disease) are avoidable through lifestyle improvements including healthier food choices, listening to satiety cues, and increased physical activity.  Sleep Disorders Clinical staff conducted group or individual video education with verbal and written material and guidebook.  Patient learns how good quality and duration of sleep are important to overall health and well-being. Patient also learns about sleep disorders and  how they impact health along with recommendations to address them, including discussing with a physician.  Nutrition  Dining Out - Part 2 Clinical staff conducted group or individual video education with verbal and written material and guidebook.  Patient learns how to plan ahead and communicate in order to maximize their dining experience in a healthy and nutritious manner. Included are recommended food choices based on the type of restaurant the patient is visiting.   Fueling a Banker conducted group or individual video education with verbal and written material and guidebook.  There is a strong connection between our food choices and our health. Diseases like obesity and type 2 diabetes are very prevalent and are in large-part due to lifestyle choices. The Pritikin Eating Plan provides plenty of food and hunger-curbing satisfaction. It is easy to follow, affordable, and helps reduce health risks.  Menu Workshop  Clinical staff conducted group or individual video education with verbal and written material and guidebook.  Patient learns that restaurant meals can sabotage health goals because they are often packed with calories, fat, sodium, and sugar. Recommendations include strategies to plan ahead and to communicate with the manager, chef, or server to help order a healthier meal.  Planning Your Eating Strategy  Clinical staff conducted group or individual video education with verbal and written material and guidebook.  Patient learns about the Pritikin Eating Plan and its benefit of reducing the risk of disease. The Pritikin Eating Plan does not focus on calories. Instead, it emphasizes high-quality, nutrient-rich foods. By knowing the characteristics  of the foods, we choose, we can determine their calorie density and make informed decisions.  Targeting Your Nutrition Priorities  Clinical staff conducted group or individual video education with verbal and written  material and guidebook.  Patient learns that lifestyle habits have a tremendous impact on disease risk and progression. This video provides eating and physical activity recommendations based on your personal health goals, such as reducing LDL cholesterol, losing weight, preventing or controlling type 2 diabetes, and reducing high blood pressure.  Vitamins and Minerals  Clinical staff conducted group or individual video education with verbal and written material and guidebook.  Patient learns different ways to obtain key vitamins and minerals, including through a recommended healthy diet. It is important to discuss all supplements you take with your doctor.   Healthy Mind-Set    Smoking Cessation  Clinical staff conducted group or individual video education with verbal and written material and guidebook.  Patient learns that cigarette smoking and tobacco addiction pose a serious health risk which affects millions of people. Stopping smoking will significantly reduce the risk of heart disease, lung disease, and many forms of cancer. Recommended strategies for quitting are covered, including working with your doctor to develop a successful plan.  Culinary   Becoming a Set designer conducted group or individual video education with verbal and written material and guidebook.  Patient learns that cooking at home can be healthy, cost-effective, quick, and puts them in control. Keys to cooking healthy recipes will include looking at your recipe, assessing your equipment needs, planning ahead, making it simple, choosing cost-effective seasonal ingredients, and limiting the use of added fats, salts, and sugars.  Cooking - Breakfast and Snacks  Clinical staff conducted group or individual video education with verbal and written material and guidebook.  Patient learns how important breakfast is to satiety and nutrition through the entire day. Recommendations include key foods to eat during  breakfast to help stabilize blood sugar levels and to prevent overeating at meals later in the day. Planning ahead is also a key component.  Cooking - Educational psychologist conducted group or individual video education with verbal and written material and guidebook.  Patient learns eating strategies to improve overall health, including an approach to cook more at home. Recommendations include thinking of animal protein as a side on your plate rather than center stage and focusing instead on lower calorie dense options like vegetables, fruits, whole grains, and plant-based proteins, such as beans. Making sauces in large quantities to freeze for later and leaving the skin on your vegetables are also recommended to maximize your experience.  Cooking - Healthy Salads and Dressing Clinical staff conducted group or individual video education with verbal and written material and guidebook.  Patient learns that vegetables, fruits, whole grains, and legumes are the foundations of the Pritikin Eating Plan. Recommendations include how to incorporate each of these in flavorful and healthy salads, and how to create homemade salad dressings. Proper handling of ingredients is also covered. Cooking - Soups and [employer] - Soups and Desserts Clinical staff conducted group or individual video education with verbal and written material and guidebook.  Patient learns that Pritikin soups and desserts make for easy, nutritious, and delicious snacks and meal components that are low in sodium, fat, sugar, and calorie density, while high in vitamins, minerals, and filling fiber. Recommendations include simple and healthy ideas for soups and desserts.   Overview     The Pritikin Solution  Program Overview Clinical staff conducted group or individual video education with verbal and written material and guidebook.  Patient learns that the results of the Pritikin Program have been documented in more than 100  articles published in peer-reviewed journals, and the benefits include reducing risk factors for (and, in some cases, even reversing) high cholesterol, high blood pressure, type 2 diabetes, obesity, and more! An overview of the three key pillars of the Pritikin Program will be covered: eating well, doing regular exercise, and having a healthy mind-set.  WORKSHOPS  Exercise: Exercise Basics: Building Your Action Plan Clinical staff led group instruction and group discussion with PowerPoint presentation and patient guidebook. To enhance the learning environment the use of posters, models and videos may be added. At the conclusion of this workshop, patients will comprehend the difference between physical activity and exercise, as well as the benefits of incorporating both, into their routine. Patients will understand the FITT (Frequency, Intensity, Time, and Type) principle and how to use it to build an exercise action plan. In addition, safety concerns and other considerations for exercise and cardiac rehab will be addressed by the presenter. The purpose of this lesson is to promote a comprehensive and effective weekly exercise routine in order to improve patients' overall level of fitness.   Managing Heart Disease: Your Path to a Healthier Heart Clinical staff led group instruction and group discussion with PowerPoint presentation and patient guidebook. To enhance the learning environment the use of posters, models and videos may be added.At the conclusion of this workshop, patients will understand the anatomy and physiology of the heart. Additionally, they will understand how Pritikin's three pillars impact the risk factors, the progression, and the management of heart disease.  The purpose of this lesson is to provide a high-level overview of the heart, heart disease, and how the Pritikin lifestyle positively impacts risk factors.  Exercise Biomechanics Clinical staff led group instruction and  group discussion with PowerPoint presentation and patient guidebook. To enhance the learning environment the use of posters, models and videos may be added. Patients will learn how the structural parts of their bodies function and how these functions impact their daily activities, movement, and exercise. Patients will learn how to promote a neutral spine, learn how to manage pain, and identify ways to improve their physical movement in order to promote healthy living. The purpose of this lesson is to expose patients to common physical limitations that impact physical activity. Participants will learn practical ways to adapt and manage aches and pains, and to minimize their effect on regular exercise. Patients will learn how to maintain good posture while sitting, walking, and lifting.  Balance Training and Fall Prevention  Clinical staff led group instruction and group discussion with PowerPoint presentation and patient guidebook. To enhance the learning environment the use of posters, models and videos may be added. At the conclusion of this workshop, patients will understand the importance of their sensorimotor skills (vision, proprioception, and the vestibular system) in maintaining their ability to balance as they age. Patients will apply a variety of balancing exercises that are appropriate for their current level of function. Patients will understand the common causes for poor balance, possible solutions to these problems, and ways to modify their physical environment in order to minimize their fall risk. The purpose of this lesson is to teach patients about the importance of maintaining balance as they age and ways to minimize their risk of falling.  WORKSHOPS   Nutrition:  Fueling a Forensic psychologist  Clinical staff led group instruction and group discussion with PowerPoint presentation and patient guidebook. To enhance the learning environment the use of posters, models and videos may be  added. Patients will review the foundational principles of the Pritikin Eating Plan and understand what constitutes a serving size in each of the food groups. Patients will also learn Pritikin-friendly foods that are better choices when away from home and review make-ahead meal and snack options. Calorie density will be reviewed and applied to three nutrition priorities: weight maintenance, weight loss, and weight gain. The purpose of this lesson is to reinforce (in a group setting) the key concepts around what patients are recommended to eat and how to apply these guidelines when away from home by planning and selecting Pritikin-friendly options. Patients will understand how calorie density may be adjusted for different weight management goals.  Mindful Eating  Clinical staff led group instruction and group discussion with PowerPoint presentation and patient guidebook. To enhance the learning environment the use of posters, models and videos may be added. Patients will briefly review the concepts of the Pritikin Eating Plan and the importance of low-calorie dense foods. The concept of mindful eating will be introduced as well as the importance of paying attention to internal hunger signals. Triggers for non-hunger eating and techniques for dealing with triggers will be explored. The purpose of this lesson is to provide patients with the opportunity to review the basic principles of the Pritikin Eating Plan, discuss the value of eating mindfully and how to measure internal cues of hunger and fullness using the Hunger Scale. Patients will also discuss reasons for non-hunger eating and learn strategies to use for controlling emotional eating.  Targeting Your Nutrition Priorities Clinical staff led group instruction and group discussion with PowerPoint presentation and patient guidebook. To enhance the learning environment the use of posters, models and videos may be added. Patients will learn how to determine  their genetic susceptibility to disease by reviewing their family history. Patients will gain insight into the importance of diet as part of an overall healthy lifestyle in mitigating the impact of genetics and other environmental insults. The purpose of this lesson is to provide patients with the opportunity to assess their personal nutrition priorities by looking at their family history, their own health history and current risk factors. Patients will also be able to discuss ways of prioritizing and modifying the Pritikin Eating Plan for their highest risk areas  Menu  Clinical staff led group instruction and group discussion with PowerPoint presentation and patient guidebook. To enhance the learning environment the use of posters, models and videos may be added. Using menus brought in from [employer], or printed from [employer], patients will apply the Pritikin dining out guidelines that were presented in the [employer] video. Patients will also be able to practice these guidelines in a variety of provided scenarios. The purpose of this lesson is to provide patients with the opportunity to practice hands-on learning of the Pritikin Dining Out guidelines with actual menus and practice scenarios.  Label Reading Clinical staff led group instruction and group discussion with PowerPoint presentation and patient guidebook. To enhance the learning environment the use of posters, models and videos may be added. Patients will review and discuss the Pritikin label reading guidelines presented in [employer]'s Label Reading Educational series video. Using fool labels brought in from local grocery stores and markets, patients will apply the label reading guidelines and determine if the packaged food meet the Pritikin  guidelines. The purpose of this lesson is to provide patients with the opportunity to review, discuss, and practice hands-on learning of the Pritikin Label Reading guidelines  with actual packaged food labels. Cooking School  Pritikin's [employer] are designed to teach patients ways to prepare quick, simple, and affordable recipes at home. The importance of nutrition's role in chronic disease risk reduction is reflected in its emphasis in the overall Pritikin program. By learning how to prepare essential core Pritikin Eating Plan recipes, patients will increase control over what they eat; be able to customize the flavor of foods without the use of added salt, sugar, or fat; and improve the quality of the food they consume. By learning a set of core recipes which are easily assembled, quickly prepared, and affordable, patients are more likely to prepare more healthy foods at home. These workshops focus on convenient breakfasts, simple entres, side dishes, and desserts which can be prepared with minimal effort and are consistent with nutrition recommendations for cardiovascular risk reduction. Cooking [employer] are taught by a Armed forces logistics/support/administrative officer (RD) who has been trained by the [employer]. The chef or RD has a clear understanding of the importance of minimizing - if not completely eliminating - added fat, sugar, and sodium in recipes. Throughout the series of Cooking School Workshop sessions, patients will learn about healthy ingredients and efficient methods of cooking to build confidence in their capability to prepare    Cooking School weekly topics:  Adding Flavor- Sodium-Free  Fast and Healthy Breakfasts  Powerhouse Plant-Based Proteins  Satisfying Salads and Dressings  Simple Sides and Sauces  International Cuisine-Spotlight on the [employer] Zones  Delicious Desserts  Savory Soups  [employer] - Meals in a Astronomer Appetizers and Snacks  Comforting Weekend Breakfasts  One-Pot Wonders   Fast Evening Meals  Landscape architect Your Pritikin Plate  WORKSHOPS   Healthy Mindset  (Psychosocial):  Focused Goals, Sustainable Changes Clinical staff led group instruction and group discussion with PowerPoint presentation and patient guidebook. To enhance the learning environment the use of posters, models and videos may be added. Patients will be able to apply effective goal setting strategies to establish at least one personal goal, and then take consistent, meaningful action toward that goal. They will learn to identify common barriers to achieving personal goals and develop strategies to overcome them. Patients will also gain an understanding of how our mind-set can impact our ability to achieve goals and the importance of cultivating a positive and growth-oriented mind-set. The purpose of this lesson is to provide patients with a deeper understanding of how to set and achieve personal goals, as well as the tools and strategies needed to overcome common obstacles which may arise along the way.  From Head to Heart: The Power of a Healthy Outlook  Clinical staff led group instruction and group discussion with PowerPoint presentation and patient guidebook. To enhance the learning environment the use of posters, models and videos may be added. Patients will be able to recognize and describe the impact of emotions and mood on physical health. They will discover the importance of self-care and explore self-care practices which may work for them. Patients will also learn how to utilize the 4 C's to cultivate a healthier outlook and better manage stress and challenges. The purpose of this lesson is to demonstrate to patients how a healthy outlook is an essential part of maintaining good health, especially as they continue their cardiac rehab  journey.  Healthy Sleep for a Healthy Heart Clinical staff led group instruction and group discussion with PowerPoint presentation and patient guidebook. To enhance the learning environment the use of posters, models and videos may be added. At the  conclusion of this workshop, patients will be able to demonstrate knowledge of the importance of sleep to overall health, well-being, and quality of life. They will understand the symptoms of, and treatments for, common sleep disorders. Patients will also be able to identify daytime and nighttime behaviors which impact sleep, and they will be able to apply these tools to help manage sleep-related challenges. The purpose of this lesson is to provide patients with a general overview of sleep and outline the importance of quality sleep. Patients will learn about a few of the most common sleep disorders. Patients will also be introduced to the concept of "sleep hygiene," and discover ways to self-manage certain sleeping problems through simple daily behavior changes. Finally, the workshop will motivate patients by clarifying the links between quality sleep and their goals of heart-healthy living.   Recognizing and Reducing Stress Clinical staff led group instruction and group discussion with PowerPoint presentation and patient guidebook. To enhance the learning environment the use of posters, models and videos may be added. At the conclusion of this workshop, patients will be able to understand the types of stress reactions, differentiate between acute and chronic stress, and recognize the impact that chronic stress has on their health. They will also be able to apply different coping mechanisms, such as reframing negative self-talk. Patients will have the opportunity to practice a variety of stress management techniques, such as deep abdominal breathing, progressive muscle relaxation, and/or guided imagery.  The purpose of this lesson is to educate patients on the role of stress in their lives and to provide healthy techniques for coping with it.  Learning Barriers/Preferences:  Learning Barriers/Preferences - 02/23/23 0843       Learning Barriers/Preferences   Learning Barriers Hearing   hearing aids    Learning Preferences Audio;Computer/Internet;Group Instruction;Individual Instruction;Skilled Demonstration;Verbal Instruction;Video;Written Material;Pictoral             Education Topics:  Knowledge Questionnaire Score:  Knowledge Questionnaire Score - 02/23/23 0843       Knowledge Questionnaire Score   Pre Score 22/24             Core Components/Risk Factors/Patient Goals at Admission:  Personal Goals and Risk Factors at Admission - 02/23/23 0845       Core Components/Risk Factors/Patient Goals on Admission    Weight Management Yes;Weight Maintenance    Intervention Weight Management: Develop a combined nutrition and exercise program designed to reach desired caloric intake, while maintaining appropriate intake of nutrient and fiber, sodium and fats, and appropriate energy expenditure required for the weight goal.;Weight Management: Provide education and appropriate resources to help participant work on and attain dietary goals.    Expected Outcomes Short Term: Continue to assess and modify interventions until short term weight is achieved;Long Term: Adherence to nutrition and physical activity/exercise program aimed toward attainment of established weight goal;Weight Maintenance: Understanding of the daily nutrition guidelines, which includes 25-35% calories from fat, 7% or less cal from saturated fats, less than 200mg  cholesterol, less than 1.5gm of sodium, & 5 or more servings of fruits and vegetables daily;Understanding recommendations for meals to include 15-35% energy as protein, 25-35% energy from fat, 35-60% energy from carbohydrates, less than 200mg  of dietary cholesterol, 20-35 gm of total fiber daily;Understanding of distribution of calorie  intake throughout the day with the consumption of 4-5 meals/snacks    Heart Failure Yes    Intervention Provide a combined exercise and nutrition program that is supplemented with education, support and counseling about heart failure.  Directed toward relieving symptoms such as shortness of breath, decreased exercise tolerance, and extremity edema.    Expected Outcomes Improve functional capacity of life;Short term: Attendance in program 2-3 days a week with increased exercise capacity. Reported lower sodium intake. Reported increased fruit and vegetable intake. Reports medication compliance.;Short term: Daily weights obtained and reported for increase. Utilizing diuretic protocols set by physician.;Long term: Adoption of self-care skills and reduction of barriers for early signs and symptoms recognition and intervention leading to self-care maintenance.    Hypertension Yes    Intervention Provide education on lifestyle modifcations including regular physical activity/exercise, weight management, moderate sodium restriction and increased consumption of fresh fruit, vegetables, and low fat dairy, alcohol moderation, and smoking cessation.;Monitor prescription use compliance.    Expected Outcomes Short Term: Continued assessment and intervention until BP is < 140/70mm HG in hypertensive participants. < 130/43mm HG in hypertensive participants with diabetes, heart failure or chronic kidney disease.;Long Term: Maintenance of blood pressure at goal levels.    Lipids Yes    Intervention Provide education and support for participant on nutrition & aerobic/resistive exercise along with prescribed medications to achieve LDL 70mg , HDL >40mg .    Expected Outcomes Short Term: Participant states understanding of desired cholesterol values and is compliant with medications prescribed. Participant is following exercise prescription and nutrition guidelines.;Long Term: Cholesterol controlled with medications as prescribed, with individualized exercise RX and with personalized nutrition plan. Value goals: LDL < 70mg , HDL > 40 mg.             Core Components/Risk Factors/Patient Goals Review:   Goals and Risk Factor Review     Row Name 02/28/23  1440 03/16/23 1531 04/17/23 0738         Core Components/Risk Factors/Patient Goals Review   Personal Goals Review Weight Management/Obesity;Heart Failure;Hypertension;Lipids Weight Management/Obesity;Heart Failure;Hypertension;Lipids Weight Management/Obesity;Heart Failure;Hypertension;Lipids     Review Tommy started cardiac rehab on 02/28/23. Tommy did well with exercise. Vital signs were stable. Brendan Holland is doing  well with exercise at cardiac rehab. Vital signs have been stable. Brendan Holland has been increasing his workloads without difficulty. Brendan Holland continues to do  well with exercise at cardiac rehab. Vital signs have been stable. Brendan Holland has been increasing his workloads without difficulty.     Expected Outcomes Tommy Will continue to participate in cardiac rehab for exercise, nutrtion and lifestyle modifications. Brendan Holland Will continue to participate in cardiac rehab for exercise, nutrtion and lifestyle modifications. Brendan Holland Will continue to participate in cardiac rehab for exercise, nutrtion and lifestyle modifications.              Core Components/Risk Factors/Patient Goals at Discharge (Final Review):   Goals and Risk Factor Review - 04/17/23 0738       Core Components/Risk Factors/Patient Goals Review   Personal Goals Review Weight Management/Obesity;Heart Failure;Hypertension;Lipids    Review Brendan Holland continues to do  well with exercise at cardiac rehab. Vital signs have been stable. Brendan Holland has been increasing his workloads without difficulty.    Expected Outcomes Tommy Will continue to participate in cardiac rehab for exercise, nutrtion and lifestyle modifications.             ITP Comments:  ITP Comments     Row Name 02/23/23 [phone] 02/28/23 1428 03/16/23 1529 04/17/23 [id number]  ITP Comments Dr. Armanda Magic medical director. Introduction to pritikin education/intensive cardiac rehab. Initial orientation packet reviewed with patient. 30 Day ITP Review. Tommy started cardiac rehab on  02/28/23. Tommy did well with exercise. 30 Day ITP Review. Brendan Holland has good attendance and participation with exercise at cardiac rehab 30 Day ITP Review. Brendan Holland continues to have good attendance and participation with exercise at cardiac rehab             Comments: See ITP Comments

## 2023-04-18 ENCOUNTER — Encounter (HOSPITAL_COMMUNITY)
Admission: RE | Admit: 2023-04-18 | Discharge: 2023-04-18 | Disposition: A | Discharge status: Home / Self Care | Source: Ambulatory Visit | Attending: Internal Medicine | Admitting: Internal Medicine

## 2023-04-18 ENCOUNTER — Encounter (HOSPITAL_COMMUNITY): Payer: PPO

## 2023-04-18 DIAGNOSIS — I2102 ST elevation (STEMI) myocardial infarction involving left anterior descending coronary artery: Secondary | ICD-10-CM | POA: Insufficient documentation

## 2023-04-18 DIAGNOSIS — Z955 Presence of coronary angioplasty implant and graft: Secondary | ICD-10-CM | POA: Insufficient documentation

## 2023-04-20 ENCOUNTER — Encounter (HOSPITAL_COMMUNITY)
Admission: RE | Admit: 2023-04-20 | Discharge: 2023-04-20 | Disposition: A | Discharge status: Home / Self Care | Payer: PPO | Source: Ambulatory Visit | Attending: Internal Medicine | Admitting: Internal Medicine

## 2023-04-20 DIAGNOSIS — I2102 ST elevation (STEMI) myocardial infarction involving left anterior descending coronary artery: Secondary | ICD-10-CM

## 2023-04-20 DIAGNOSIS — Z955 Presence of coronary angioplasty implant and graft: Secondary | ICD-10-CM

## 2023-04-23 ENCOUNTER — Encounter (HOSPITAL_COMMUNITY)
Admission: RE | Admit: 2023-04-23 | Discharge: 2023-04-23 | Disposition: A | Discharge status: Home / Self Care | Payer: PPO | Source: Ambulatory Visit | Attending: Internal Medicine | Admitting: Internal Medicine

## 2023-04-23 DIAGNOSIS — I2102 ST elevation (STEMI) myocardial infarction involving left anterior descending coronary artery: Secondary | ICD-10-CM

## 2023-04-23 DIAGNOSIS — Z955 Presence of coronary angioplasty implant and graft: Secondary | ICD-10-CM

## 2023-04-25 ENCOUNTER — Encounter (HOSPITAL_COMMUNITY)
Admission: RE | Admit: 2023-04-25 | Discharge: 2023-04-25 | Disposition: A | Discharge status: Home / Self Care | Payer: PPO | Source: Ambulatory Visit | Attending: Internal Medicine

## 2023-04-25 DIAGNOSIS — Z955 Presence of coronary angioplasty implant and graft: Secondary | ICD-10-CM

## 2023-04-25 DIAGNOSIS — I2102 ST elevation (STEMI) myocardial infarction involving left anterior descending coronary artery: Secondary | ICD-10-CM | POA: Diagnosis not present

## 2023-04-27 ENCOUNTER — Encounter (HOSPITAL_COMMUNITY)
Admission: RE | Admit: 2023-04-27 | Discharge: 2023-04-27 | Disposition: A | Discharge status: Home / Self Care | Payer: PPO | Source: Ambulatory Visit | Attending: Internal Medicine | Admitting: Internal Medicine

## 2023-04-27 DIAGNOSIS — I2102 ST elevation (STEMI) myocardial infarction involving left anterior descending coronary artery: Secondary | ICD-10-CM | POA: Diagnosis not present

## 2023-04-27 DIAGNOSIS — Z955 Presence of coronary angioplasty implant and graft: Secondary | ICD-10-CM

## 2023-04-30 ENCOUNTER — Telehealth (HOSPITAL_COMMUNITY): Payer: Self-pay

## 2023-04-30 ENCOUNTER — Encounter (HOSPITAL_COMMUNITY): Admission: RE | Admit: 2023-04-30 | Payer: PPO | Source: Ambulatory Visit

## 2023-04-30 NOTE — Telephone Encounter (Signed)
 Patient c/o due to wife being sick, he may need to take her to ER. Also requested to attend the 1:45pm class on Wednesday instead of his usual 10:15am class.

## 2023-05-02 ENCOUNTER — Encounter (HOSPITAL_COMMUNITY): Payer: PPO

## 2023-05-02 ENCOUNTER — Encounter (HOSPITAL_COMMUNITY)
Admission: RE | Admit: 2023-05-02 | Discharge: 2023-05-02 | Discharge status: Home / Self Care | Source: Ambulatory Visit | Attending: Internal Medicine

## 2023-05-02 DIAGNOSIS — I2102 ST elevation (STEMI) myocardial infarction involving left anterior descending coronary artery: Secondary | ICD-10-CM | POA: Diagnosis not present

## 2023-05-02 DIAGNOSIS — Z955 Presence of coronary angioplasty implant and graft: Secondary | ICD-10-CM

## 2023-05-04 ENCOUNTER — Encounter (HOSPITAL_COMMUNITY)
Admission: RE | Admit: 2023-05-04 | Discharge: 2023-05-04 | Disposition: A | Discharge status: Home / Self Care | Payer: PPO | Source: Ambulatory Visit | Attending: Internal Medicine

## 2023-05-04 DIAGNOSIS — I2102 ST elevation (STEMI) myocardial infarction involving left anterior descending coronary artery: Secondary | ICD-10-CM | POA: Diagnosis not present

## 2023-05-04 DIAGNOSIS — Z955 Presence of coronary angioplasty implant and graft: Secondary | ICD-10-CM

## 2023-05-07 ENCOUNTER — Telehealth (HOSPITAL_COMMUNITY): Payer: Self-pay

## 2023-05-07 ENCOUNTER — Encounter (HOSPITAL_COMMUNITY): Admission: RE | Admit: 2023-05-07 | Payer: PPO | Source: Ambulatory Visit

## 2023-05-07 NOTE — Telephone Encounter (Signed)
 Patient c/o for 10:15am class, states he needs to take his wife to the doctor. Will attend Wednesday class.

## 2023-05-08 ENCOUNTER — Ambulatory Visit (HOSPITAL_COMMUNITY): Payer: PPO | Attending: Cardiovascular Disease

## 2023-05-08 DIAGNOSIS — F0280 Dementia in other diseases classified elsewhere without behavioral disturbance: Secondary | ICD-10-CM | POA: Diagnosis present

## 2023-05-08 DIAGNOSIS — I5020 Unspecified systolic (congestive) heart failure: Secondary | ICD-10-CM | POA: Diagnosis not present

## 2023-05-08 DIAGNOSIS — I4729 Other ventricular tachycardia: Secondary | ICD-10-CM | POA: Insufficient documentation

## 2023-05-08 DIAGNOSIS — I2510 Atherosclerotic heart disease of native coronary artery without angina pectoris: Secondary | ICD-10-CM | POA: Diagnosis present

## 2023-05-08 DIAGNOSIS — G300 Alzheimer's disease with early onset: Secondary | ICD-10-CM | POA: Insufficient documentation

## 2023-05-08 DIAGNOSIS — I498 Other specified cardiac arrhythmias: Secondary | ICD-10-CM | POA: Diagnosis not present

## 2023-05-08 LAB — ECHOCARDIOGRAM COMPLETE
Area-P 1/2: 2.79 cm2
S' Lateral: 2.6 cm

## 2023-05-08 NOTE — Progress Notes (Signed)
 Cardiac Individual Treatment Plan  Patient Details  Name: Brendan Holland MRN: [id number] Date of Birth: 1950-02-25 Referring Provider:   Flowsheet Row INTENSIVE CARDIAC REHAB ORIENT from 02/23/2023 in [facility]  Referring Provider Alyssa Backbone, MD       Initial Encounter Date:  Flowsheet Row INTENSIVE CARDIAC REHAB ORIENT from 02/23/2023 in [facility] for [facility], [facility], [facility]  Date 02/23/23       Visit Diagnosis: 02/03/23 STEMI  02/03/23 DES LAD  Patient's Home Medications on Admission:  Current Outpatient Medications:    aspirin  EC 81 MG tablet, Take 81 mg by mouth at bedtime., Disp: , Rfl:    atorvastatin  (LIPITOR) 80 MG tablet, Take 1 tablet (80 mg total) by mouth daily., Disp: 30 tablet, Rfl: 6   donepezil  (ARICEPT ) 10 MG tablet, TAKE 1 TABLET BY MOUTH DAILY (Patient taking differently: Take 10 mg by mouth at bedtime.), Disp: 90 tablet, Rfl: 2   empagliflozin  (JARDIANCE ) 10 MG TABS tablet, Take 1 tablet (10 mg total) by mouth daily before breakfast., Disp: 90 tablet, Rfl: 3   GLUCOSAMINE CHONDROITIN COMPLX PO, Take 1 tablet by mouth at bedtime., Disp: , Rfl:    metoprolol  succinate (TOPROL  XL) 25 MG 24 hr tablet, Take 1 tablet (25 mg total) by mouth daily., Disp: 90 tablet, Rfl: 3   Multiple Vitamins-Minerals (MULTIVITAMIN WITH MINERALS) tablet, Take 1 tablet by mouth at bedtime., Disp: , Rfl:    Omega-3 Fatty Acids (FISH OIL PO), Take 1 capsule by mouth at bedtime., Disp: , Rfl:    pantoprazole  (PROTONIX ) 40 MG tablet, Take 1 tablet (40 mg total) by mouth daily., Disp: 30 tablet, Rfl: 6   spironolactone  (ALDACTONE ) 25 MG tablet, Take 0.5 tablets (12.5 mg total) by mouth daily. (Patient taking differently: Take 25 mg by mouth daily.), Disp: 15 tablet, Rfl: 6   ticagrelor  (BRILINTA ) 90 MG TABS tablet, Take 1 tablet (90 mg total) by mouth 2 (two) times daily., Disp: 60 tablet, Rfl: 6    traZODone (DESYREL) 50 MG tablet, Take 50 mg by mouth at bedtime. (Patient taking differently: Take 25 mg by mouth at bedtime.), Disp: , Rfl:   Past Medical History: Past Medical History:  Diagnosis Date   Arthritis    "arthritis right knee"   Cancer (HCC)    "skin cancer of scalp" -tx with topical meds-"all clear now"   Dementia (HCC)    "pre Alzheimers" "[facility]"conitive impairment.  Negative on latest testing   DVT of lower extremity (deep venous thrombosis) (HCC)    GERD (gastroesophageal reflux disease)    Headache(784.0)    Heart rate slow    Avid runner" 30 miles per week"   Hepatitis C    Tx. [facility]- 3 yrs ago- "now Clear"   Sleep apnea    no cpap use today"condition improved".  Most recent study neg   Stroke (HCC)     Tobacco Use: Social History   Tobacco Use  Smoking Status Former   Current packs/day: 0.00   Types: Cigarettes   Quit date: 12/20/1982   Years since quitting: 40.4  Smokeless Tobacco Never    Labs: Review Flowsheet       Latest Ref Rng & Units 02/03/2023 02/05/2023 04/12/2023  Labs for ITP Cardiac and Pulmonary Rehab  Cholestrol 100 - 199 mg/dL 528  - 413   LDL (calc) 0 - 99 mg/dL 88  - 72   HDL-C >24 mg/dL  40  - 52   Trlycerides 0 - 149 mg/dL 53  - 85   Hemoglobin V9D 4.8 - 5.6 % 5.3  - -  PH, Arterial 7.35 - 7.45 - 7.271  -  PCO2 arterial 32 - 48 mmHg - 41.2  -  Bicarbonate 20.0 - 28.0 mmol/L - 19.0  -  TCO2 22 - 32 mmol/L - 20  -  Acid-base deficit 0.0 - 2.0 mmol/L - 8.0  -  O2 Saturation % - 96  -    Capillary Blood Glucose: No results found for: "GLUCAP"   Exercise Target Goals: Exercise Program Goal: Individual exercise prescription set using results from initial 6 min walk test and THRR while considering  patient's activity barriers and safety.   Exercise Prescription Goal: Initial exercise prescription builds to 30-45 minutes a day of aerobic activity, 2-3 days per week.  Home exercise guidelines will be given to patient during  program as part of exercise prescription that the participant will acknowledge.  Activity Barriers & Risk Stratification:  Activity Barriers & Cardiac Risk Stratification - 02/23/23 0841       Activity Barriers & Cardiac Risk Stratification   Activity Barriers Arthritis;Right Knee Replacement;Decreased Ventricular Function    Cardiac Risk Stratification High   <5 METs on            6 Minute Walk:  6 Minute Walk     Row Name 02/23/23 0958         6 Minute Walk   Phase Initial     Distance 1200 feet     Walk Time 6 minutes     # of Rest Breaks 0     MPH 2.27     METS 2.67     RPE 10     Perceived Dyspnea  0     VO2 Peak 9.35     Symptoms No     Resting HR 64 bpm     Resting BP 122/64     Resting Oxygen Saturation  98 %     Exercise Oxygen Saturation  during 6 min walk 100 %     Max Ex. HR 75 bpm     Max Ex. BP 138/72     2 Minute Post BP 118/68              Oxygen Initial Assessment:   Oxygen Re-Evaluation:   Oxygen Discharge (Final Oxygen Re-Evaluation):   Initial Exercise Prescription:  Initial Exercise Prescription - 02/23/23 0900       Date of Initial Exercise RX and Referring Provider   Date 02/23/23    Referring Provider Alyssa Backbone, MD    Expected Discharge Date 05/16/23      Treadmill   MPH 2    Grade 0    Minutes 15    METs 2.5      NuStep   Level 2    SPM 60    Minutes 15    METs 2.5      Prescription Details   Frequency (times per week) 3    Duration Progress to 30 minutes of continuous aerobic without signs/symptoms of physical distress      Intensity   THRR 40-80% of Max Heartrate 58-117    Ratings of Perceived Exertion 11-13    Perceived Dyspnea 0-4      Progression   Progression Continue progressive overload as per policy without signs/symptoms or physical distress.      Paramedic  Prescription Yes    Weight 4    Reps 10-15             Perform Capillary Blood Glucose checks as  needed.  Exercise Prescription Changes:   Exercise Prescription Changes     Row Name 02/28/23 1200 03/05/23 1029 03/19/23 1027 04/02/23 1030 04/16/23 1017     Response to Exercise   Blood Pressure (Admit) 100/60 118/62 120/62 120/60 118/70   Blood Pressure (Exercise) 120/70 128/78 -- -- --   Blood Pressure (Exit) 120/80 102/64 106/60 138/80 110/60   Heart Rate (Admit) 85 bpm 55 bpm 50 bpm 54 bpm 53 bpm   Heart Rate (Exercise) 108 bpm 108 bpm 100 bpm 120 bpm 125 bpm   Heart Rate (Exit) 94 bpm 68 bpm 59 bpm 68 bpm 65 bpm   Rating of Perceived Exertion (Exercise) 11 10 10 11 11    Symptoms None None None None None   Comments Pt's first day in the CRP2 program -- Reviewed goals with patient. Increased workloads on TM and NuStep. Increased hand weights. -- Reviewed METs with Brendan Holland.   Duration Continue with 30 min of aerobic exercise without signs/symptoms of physical distress. Continue with 30 min of aerobic exercise without signs/symptoms of physical distress. Continue with 30 min of aerobic exercise without signs/symptoms of physical distress. Continue with 30 min of aerobic exercise without signs/symptoms of physical distress. Continue with 30 min of aerobic exercise without signs/symptoms of physical distress.   Intensity THRR unchanged THRR unchanged THRR unchanged THRR unchanged THRR unchanged     Progression   Progression Continue to progress workloads to maintain intensity without signs/symptoms of physical distress. Continue to progress workloads to maintain intensity without signs/symptoms of physical distress. Continue to progress workloads to maintain intensity without signs/symptoms of physical distress. Continue to progress workloads to maintain intensity without signs/symptoms of physical distress. Continue to progress workloads to maintain intensity without signs/symptoms of physical distress.   Average METs 3.1 3.4 4.1 4.8 4.9     Resistance Training   Training Prescription No  Yes Yes Yes Yes   Weight No weights on Wednesdays 4 lbs 5 lbs 6 lbs 6 lbs   Reps -- 10-15 10-15 10-15 10-15   Time -- 10 Minutes 10 Minutes 10 Minutes 10 Minutes     Interval Training   Interval Training No No No No No     Treadmill   MPH 2.5 2.5 3.2 3.5 3.5   Grade 0 0 1.5 2 2    Minutes 15 15 15 15 15    METs 2.91 2.91 4.11 4.65 4.65     NuStep   Level 2 3 5 6 6    SPM 112 112 113 115 123   Minutes 15 15 15 15 15    METs 3.9 4 4.1 5 5.1     Home Exercise Plan   Plans to continue exercise at -- -- Home (comment)  Walking Home (comment)  Walking Home (comment)  Walking   Frequency -- -- Add 3 additional days to program exercise sessions. Add 3 additional days to program exercise sessions. Add 3 additional days to program exercise sessions.   Initial Home Exercises Provided -- -- -- 03/23/23 03/23/23    Row Name 05/04/23 1015             Response to Exercise   Blood Pressure (Admit) 130/60       Blood Pressure (Exit) 98/62       Heart Rate (Admit) 52 bpm  Heart Rate (Exercise) 126 bpm       Heart Rate (Exit) 65 bpm       Rating of Perceived Exertion (Exercise) 11.5       Symptoms None       Comments Increased WL on TM.       Duration Continue with 30 min of aerobic exercise without signs/symptoms of physical distress.       Intensity THRR unchanged         Progression   Progression Continue to progress workloads to maintain intensity without signs/symptoms of physical distress.       Average METs 5.9         Resistance Training   Training Prescription Yes       Weight 6 lbs       Reps 10-15       Time 10 Minutes         Interval Training   Interval Training No         Treadmill   MPH 3.7       Grade 3.5       Minutes 15       METs 5.62         NuStep   Level 6       SPM 121       Minutes 15       METs 6.1         Home Exercise Plan   Plans to continue exercise at Home (comment)  Walking       Frequency Add 3 additional days to program exercise  sessions.       Initial Home Exercises Provided 03/23/23                Exercise Comments:   Exercise Comments     Row Name 02/28/23 1206 03/19/23 1059 03/23/23 1035 04/16/23 1058 04/18/23 1537   Exercise Comments Pt's first day in the CRP2 program. Pt did well with no complaints. Increased workload on treadmill as pt felt it was too easy. Reviewed goals with Brendan Holland. Increased workloads on the treadmill and Nustep. Increased hand weights to 5 lbs. Reviewed home exercise guideines and goals with Brendan Holland. Reviewed METs with Brendan Holland. Reviewed goals with Brendan Holland.            Exercise Goals and Review:   Exercise Goals     Row Name 02/23/23 0841             Exercise Goals   Increase Physical Activity Yes       Intervention Provide advice, education, support and counseling about physical activity/exercise needs.;Develop an individualized exercise prescription for aerobic and resistive training based on initial evaluation findings, risk stratification, comorbidities and participant's personal goals.       Expected Outcomes Short Term: Attend rehab on a regular basis to increase amount of physical activity.;Long Term: Exercising regularly at least 3-5 days a week.;Long Term: Add in home exercise to make exercise part of routine and to increase amount of physical activity.       Increase Strength and Stamina Yes       Intervention Provide advice, education, support and counseling about physical activity/exercise needs.;Develop an individualized exercise prescription for aerobic and resistive training based on initial evaluation findings, risk stratification, comorbidities and participant's personal goals.       Expected Outcomes Short Term: Increase workloads from initial exercise prescription for resistance, speed, and METs.;Short Term: Perform resistance training exercises routinely during rehab and add in  resistance training at home;Long Term: Improve cardiorespiratory fitness, muscular  endurance and strength as measured by increased METs and functional capacity ( )       Able to understand and use rate of perceived exertion (RPE) scale Yes       Intervention Provide education and explanation on how to use RPE scale       Expected Outcomes Short Term: Able to use RPE daily in rehab to express subjective intensity level;Long Term:  Able to use RPE to guide intensity level when exercising independently       Knowledge and understanding of Target Heart Rate Range (THRR) Yes       Intervention Provide education and explanation of THRR including how the numbers were predicted and where they are located for reference       Expected Outcomes Short Term: Able to state/look up THRR;Short Term: Able to use daily as guideline for intensity in rehab;Long Term: Able to use THRR to govern intensity when exercising independently       Understanding of Exercise Prescription Yes       Intervention Provide education, explanation, and written materials on patient's individual exercise prescription       Expected Outcomes Short Term: Able to explain program exercise prescription;Long Term: Able to explain home exercise prescription to exercise independently                Exercise Goals Re-Evaluation :  Exercise Goals Re-Evaluation     Row Name 02/28/23 1205 03/19/23 1059 03/23/23 1035 04/18/23 1537       Exercise Goal Re-Evaluation   Exercise Goals Review Increase Physical Activity;Understanding of Exercise Prescription;Increase Strength and Stamina;Knowledge and understanding of Target Heart Rate Range (THRR);Able to understand and use rate of perceived exertion (RPE) scale Increase Physical Activity;Understanding of Exercise Prescription;Increase Strength and Stamina;Knowledge and understanding of Target Heart Rate Range (THRR);Able to understand and use rate of perceived exertion (RPE) scale Increase Physical Activity;Understanding of Exercise Prescription;Increase Strength and  Stamina;Knowledge and understanding of Target Heart Rate Range (THRR);Able to understand and use rate of perceived exertion (RPE) scale Increase Physical Activity;Understanding of Exercise Prescription;Increase Strength and Stamina;Knowledge and understanding of Target Heart Rate Range (THRR);Able to understand and use rate of perceived exertion (RPE) scale    Comments Pt's first day in the CRP2 program. Pt understands the exercise Rx, RPE scale and THRR. Brendan Holland continues to increase workloads appropriately at cardiac rehab. He is walking at home 20 minutes 4 days/week. His goal is to increase his endurance and stamina. He would like to resume body weight exercises, such as leg lifts and sit-ups. I instructed him to check with his cardiologist to see if he has any lifting restrictions before resuming body weight exercises, and he is agreeable. Reviewed exercise prescription with Brendan Holland. He received clearance to return to low to moderate intensity resistance training up to 15 lb dumb bells and leg lifts from Charles Connor, Marieta Shorten, NP. His goal is to get back to running and resume his usual routine. He has a meeting at [facility] to check out the facility and may exercise there as well. Brendan Holland continues to progess well with exercise. He plans to join [employer] and in the meantime continue home exercise routine. He discussed his returning to his previous weight training routine with his cardiologist as was cleared to resume starting lighter and progressing from there. He is doing sit-ups, push ups and leg lifts as part of his home exercise routine.    Expected Outcomes  Will continue to monitor the patient and increase exercise workloads as tolerated. Continue to progress workloads as tolerated to help increase endurance and stamina. Continue daily exercise at low to moderate intensity. Progress workloads gradually as tolerated. Brendan Holland will resume his weight training routine and join local fitness center.              Discharge Exercise Prescription (Final Exercise Prescription Changes):  Exercise Prescription Changes - 05/04/23 1015       Response to Exercise   Blood Pressure (Admit) 130/60    Blood Pressure (Exit) 98/62    Heart Rate (Admit) 52 bpm    Heart Rate (Exercise) 126 bpm    Heart Rate (Exit) 65 bpm    Rating of Perceived Exertion (Exercise) 11.5    Symptoms None    Comments Increased WL on TM.    Duration Continue with 30 min of aerobic exercise without signs/symptoms of physical distress.    Intensity THRR unchanged      Progression   Progression Continue to progress workloads to maintain intensity without signs/symptoms of physical distress.    Average METs 5.9      Resistance Training   Training Prescription Yes    Weight 6 lbs    Reps 10-15    Time 10 Minutes      Interval Training   Interval Training No      Treadmill   MPH 3.7    Grade 3.5    Minutes 15    METs 5.62      NuStep   Level 6    SPM 121    Minutes 15    METs 6.1      Home Exercise Plan   Plans to continue exercise at Home (comment)   Walking   Frequency Add 3 additional days to program exercise sessions.    Initial Home Exercises Provided 03/23/23             Nutrition:  Target Goals: Understanding of nutrition guidelines, daily intake of sodium 1500mg , cholesterol 200mg , calories 30% from fat and 7% or less from saturated fats, daily to have 5 or more servings of fruits and vegetables.  Biometrics:  Pre Biometrics - 02/23/23 0835       Pre Biometrics   Waist Circumference 34 inches    Hip Circumference 37 inches    Waist to Hip Ratio 0.92 %    Triceps Skinfold 10 mm    % Body Fat 42 %    Grip Strength 42 kg    Flexibility 15 in    Single Leg Stand 17.06 seconds              Nutrition Therapy Plan and Nutrition Goals:  Nutrition Therapy & Goals - 04/13/23 1026       Nutrition Therapy   Diet heart healthy diet    Drug/Food Interactions Statins/Certain Fruits       Personal Nutrition Goals   Nutrition Goal Patient to identify strategies for reducing cardiovascular risk by attending the [employer] education and nutrition series weekly.   goal not met.   Personal Goal #2 Patient to improve diet quality by using the plate method as a guide for meal planning to include lean protein/plant protein, fruits, vegetables, whole grains, nonfat dairy as part of a well-balanced diet.   goal in progress.   Comments Goals in progress. Brendan Holland has medical history of STEMI, HTN, hyperlipidemia, dementia. His blood pressure remains well controlled. Lipids WNL, LDL has improved to 72.  He is down 2.2# since starting with our program. He does not regulalrly attend the [employer] education/nutrition series.. Patient will benefit from participation in intensive cardiac rehab for nutrition, exercise, and lifestyle modification.      Intervention Plan   Intervention Prescribe, educate and counsel regarding individualized specific dietary modifications aiming towards targeted core components such as weight, hypertension, lipid management, diabetes, heart failure and other comorbidities.;Nutrition handout(s) given to patient.    Expected Outcomes Short Term Goal: Understand basic principles of dietary content, such as calories, fat, sodium, cholesterol and nutrients.;Long Term Goal: Adherence to prescribed nutrition plan.             Nutrition Assessments:  MEDIFICTS Score Key: >=70 Need to make dietary changes  40-70 Heart Healthy Diet <= 40 Therapeutic Level Cholesterol Diet   Flowsheet Row INTENSIVE CARDIAC REHAB from 02/28/2023 in [facility] Heart, Vascular, & Lung Health  Picture Your Plate Total Score on Admission 56      Picture Your Plate Scores: <16 Unhealthy dietary pattern with much room for improvement. 41-50 Dietary pattern unlikely to meet recommendations for good health and room for improvement. 51-60 More healthful dietary  pattern, with some room for improvement.  >60 Healthy dietary pattern, although there may be some specific behaviors that could be improved.    Nutrition Goals Re-Evaluation:  Nutrition Goals Re-Evaluation     Row Name 03/15/23 0845 04/13/23 1026           Goals   Current Weight 171 lb 4.8 oz (77.7 kg) 168 lb 14 oz (76.6 kg)      Comment LDL 88, HDL 40, Lpa WNL, A1c WNL LDL 72, HDL 52; other most recentl labs A1c WNL, Lpa WNL      Expected Outcome Goals in progress. Brendan Holland has medical history of STEMI, HTN, hyperlipidemia, dementia. His blood pressure remains well controlled. Lipids WNL, LDL 88. He has maintained his weight since starting with our program. He has attended 3 [facility] education sessions. Patient will benefit from participation in intensive cardiac rehab for nutrition, exercise, and lifestyle modification.l Goals in progress. Nakai has medical history of STEMI, HTN, hyperlipidemia, dementia. His blood pressure remains well controlled. Lipids WNL, LDL has improved to 72. He is down 2.2# since starting with our program. He does not regulalrly attend the [facility] education/nutrition series.. Patient will benefit from participation in intensive cardiac rehab for nutrition, exercise, and lifestyle modification.               Nutrition Goals Re-Evaluation:  Nutrition Goals Re-Evaluation     Row Name 03/15/23 0845 04/13/23 1026           Goals   Current Weight 171 lb 4.8 oz (77.7 kg) 168 lb 14 oz (76.6 kg)      Comment LDL 88, HDL 40, Lpa WNL, A1c WNL LDL 72, HDL 52; other most recentl labs A1c WNL, Lpa WNL      Expected Outcome Goals in progress. Brendan Holland has medical history of STEMI, HTN, hyperlipidemia, dementia. His blood pressure remains well controlled. Lipids WNL, LDL 88. He has maintained his weight since starting with our program. He has attended 3 [facility] education sessions. Patient will benefit from participation in intensive cardiac rehab for nutrition, exercise,  and lifestyle modification.l Goals in progress. Brendan Holland has medical history of STEMI, HTN, hyperlipidemia, dementia. His blood pressure remains well controlled. Lipids WNL, LDL has improved to 72. He is down 2.2# since starting with our program. He does  not regulalrly attend the [employer] education/nutrition series.. Patient will benefit from participation in intensive cardiac rehab for nutrition, exercise, and lifestyle modification.               Nutrition Goals Discharge (Final Nutrition Goals Re-Evaluation):  Nutrition Goals Re-Evaluation - 04/13/23 1026       Goals   Current Weight 168 lb 14 oz (76.6 kg)    Comment LDL 72, HDL 52; other most recentl labs A1c WNL, Lpa WNL    Expected Outcome Goals in progress. Brendan Holland has medical history of STEMI, HTN, hyperlipidemia, dementia. His blood pressure remains well controlled. Lipids WNL, LDL has improved to 72. He is down 2.2# since starting with our program. He does not regulalrly attend the [facility] education/nutrition series.. Patient will benefit from participation in intensive cardiac rehab for nutrition, exercise, and lifestyle modification.             Psychosocial: Target Goals: Acknowledge presence or absence of significant depression and/or stress, maximize coping skills, provide positive support system. Participant is able to verbalize types and ability to use techniques and skills needed for reducing stress and depression.  Initial Review & Psychosocial Screening:  Initial Psych Review & Screening - 02/23/23 0845       Initial Review   Current issues with None Identified      Family Dynamics   Good Support System? Yes   Wife for support   Comments Brendan Holland denies any feelings of depression/stress/anxiety. However, he shared that he has some days when he feels down because he is unable to participate in former activities and is starting to get bored at home. Brendan Holland is eager to exercise and begin CRP2.      Barriers    Psychosocial barriers to participate in program There are no identifiable barriers or psychosocial needs.      Screening Interventions   Interventions Encouraged to exercise;Provide feedback about the scores to participant    Expected Outcomes Long Term goal: The participant improves quality of Life and PHQ9 Scores as seen by post scores and/or verbalization of changes;Short Term goal: Identification and review with participant of any Quality of Life or Depression concerns found by scoring the questionnaire.             Quality of Life Scores:  Quality of Life - 02/23/23 1012       Quality of Life   Select Quality of Life      Quality of Life Scores   Health/Function Pre 27.47 %    Socioeconomic Pre 28 %    Psych/Spiritual Pre 30 %    Family Pre 28.8 %    GLOBAL Pre 28.25 %            Scores of 19 and below usually indicate a poorer quality of life in these areas.  A difference of  2-3 points is a clinically meaningful difference.  A difference of 2-3 points in the total score of the Quality of Life Index has been associated with significant improvement in overall quality of life, self-image, physical symptoms, and general health in studies assessing change in quality of life.  PHQ-9: Review Flowsheet       02/23/2023  Depression screen PHQ 2/9  Decreased Interest 1  Down, Depressed, Hopeless 1  PHQ - 2 Score 2  Altered sleeping 0  Tired, decreased energy 1  Change in appetite 0  Feeling bad or failure about yourself  0  Trouble concentrating 0  Moving slowly or  fidgety/restless 0  Suicidal thoughts 0  PHQ-9 Score 3  Difficult doing work/chores Not difficult at all   Interpretation of Total Score  Total Score Depression Severity:  1-4 = Minimal depression, 5-9 = Mild depression, 10-14 = Moderate depression, 15-19 = Moderately severe depression, 20-27 = Severe depression   Psychosocial Evaluation and Intervention:   Psychosocial Re-Evaluation:  Psychosocial  Re-Evaluation     Row Name 02/28/23 1435 03/16/23 1530 04/17/23 0737 05/04/23 0934       Psychosocial Re-Evaluation   Current issues with None Identified None Identified None Identified None Identified    Interventions Encouraged to attend Cardiac Rehabilitation for the exercise Encouraged to attend Cardiac Rehabilitation for the exercise Encouraged to attend Cardiac Rehabilitation for the exercise Encouraged to attend Cardiac Rehabilitation for the exercise    Continue Psychosocial Services  No Follow up required No Follow up required No Follow up required No Follow up required             Psychosocial Discharge (Final Psychosocial Re-Evaluation):  Psychosocial Re-Evaluation - 05/04/23 0934       Psychosocial Re-Evaluation   Current issues with None Identified    Interventions Encouraged to attend Cardiac Rehabilitation for the exercise    Continue Psychosocial Services  No Follow up required             Vocational Rehabilitation: Provide vocational rehab assistance to qualifying candidates.   Vocational Rehab Evaluation & Intervention:  Vocational Rehab - 02/23/23 0844       Initial Vocational Rehab Evaluation & Intervention   Assessment shows need for Vocational Rehabilitation No   Brendan Holland is retired            Education: Education Goals: Education classes will be provided on a weekly basis, covering required topics. Participant will state understanding/return demonstration of topics presented.    Education     Row Name 02/28/23 1000     Education   Cardiac Education Topics Pritikin   Orthoptist   Educator Dietitian   Weekly Topic Adding Flavor - Sodium-Free   Instruction Review Code 1- Verbalizes Understanding   Class Start Time 1145   Class Stop Time 1225   Class Time Calculation (min) 40 min    Row Name 03/02/23 1500     Education   Cardiac Education Topics Pritikin   Select Core Videos     Core Videos    Educator Dietitian   Select Nutrition   Nutrition Overview of the Pritikin Eating Plan   Instruction Review Code 1- Verbalizes Understanding   Class Start Time 1145   Class Stop Time 1238   Class Time Calculation (min) 53 min    Row Name 03/09/23 1200     Education   Cardiac Education Topics Pritikin   Select Core Videos     Core Videos   Educator Dietitian   Select Nutrition   Nutrition Other  Label Reading   Instruction Review Code 1- Verbalizes Understanding   Class Start Time 1152   Class Stop Time 1233   Class Time Calculation (min) 41 min    Row Name 03/19/23 1100     Education   Cardiac Education Topics Pritikin   [employer]     Workshops   Educator Exercise Physiologist   Select Psychosocial   Psychosocial Workshop Recognizing and Reducing Stress   Instruction Review Code 1- Verbalizes Understanding   Class Start Time 1145   Class Stop Time 1228  Class Time Calculation (min) 43 min    Row Name 03/21/23 1400     Education   Cardiac Education Topics --   Select --     Hospital doctor --   Weekly Topic --   Instruction Review Code --   Class Start Time --   Class Stop Time --   Class Time Calculation (min) --    Row Name 03/30/23 1200     Education   Cardiac Education Topics Pritikin   Psychologist, forensic Exercise Education   Exercise Education Move It!   Instruction Review Code 1- Verbalizes Understanding   Class Start Time 1146   Class Stop Time 1222   Class Time Calculation (min) 36 min    Row Name 04/18/23 1400     Education   Cardiac Education Topics Pritikin   Actor --   Physiological scientist --   Instruction Review Code --     Secondary school teacher   Weekly Topic Fast Evening Meals   Instruction Review Code 1- Verbalizes Understanding   Class Start Time 1400   Class Stop Time 1440   Class Time Calculation  (min) 40 min    Row Name 05/02/23 1500     Education   Cardiac Education Topics Pritikin   Customer service manager   Weekly Topic Simple Sides and Sauces   Instruction Review Code 1- Verbalizes Understanding   Class Start Time 1355   Class Stop Time 1431   Class Time Calculation (min) 36 min            Core Videos: Exercise    Move It!  Clinical staff conducted group or individual video education with verbal and written material and guidebook.  Patient learns the recommended Pritikin exercise program. Exercise with the goal of living a long, healthy life. Some of the health benefits of exercise include controlled diabetes, healthier blood pressure levels, improved cholesterol levels, improved heart and lung capacity, improved sleep, and better body composition. Everyone should speak with their doctor before starting or changing an exercise routine.  Biomechanical Limitations Clinical staff conducted group or individual video education with verbal and written material and guidebook.  Patient learns how biomechanical limitations can impact exercise and how we can mitigate and possibly overcome limitations to have an impactful and balanced exercise routine.  Body Composition Clinical staff conducted group or individual video education with verbal and written material and guidebook.  Patient learns that body composition (ratio of muscle mass to fat mass) is a key component to assessing overall fitness, rather than body weight alone. Increased fat mass, especially visceral belly fat, can put us  at increased risk for metabolic syndrome, type 2 diabetes, heart disease, and even death. It is recommended to combine diet and exercise (cardiovascular and resistance training) to improve your body composition. Seek guidance from your physician and exercise physiologist before implementing an exercise routine.  Exercise Action Plan Clinical staff conducted  group or individual video education with verbal and written material and guidebook.  Patient learns the recommended strategies to achieve and enjoy long-term exercise adherence, including variety, self-motivation, self-efficacy, and positive decision making. Benefits of exercise include fitness, good health, weight management, more energy, better sleep, less stress, and overall well-being.  Medical   Heart Disease  Risk Reduction Clinical staff conducted group or individual video education with verbal and written material and guidebook.  Patient learns our heart is our most vital organ as it circulates oxygen, nutrients, white blood cells, and hormones throughout the entire body, and carries waste away. Data supports a plant-based eating plan like the Pritikin Program for its effectiveness in slowing progression of and reversing heart disease. The video provides a number of recommendations to address heart disease.   Metabolic Syndrome and Belly Fat  Clinical staff conducted group or individual video education with verbal and written material and guidebook.  Patient learns what metabolic syndrome is, how it leads to heart disease, and how one can reverse it and keep it from coming back. You have metabolic syndrome if you have 3 of the following 5 criteria: abdominal obesity, high blood pressure, high triglycerides, low HDL cholesterol, and high blood sugar.  Hypertension and Heart Disease Clinical staff conducted group or individual video education with verbal and written material and guidebook.  Patient learns that high blood pressure, or hypertension, is very common in the [place] . Hypertension is largely due to excessive salt intake, but other important risk factors include being overweight, physical inactivity, drinking too much alcohol, smoking, and not eating enough potassium from fruits and vegetables. High blood pressure is a leading risk factor for heart attack, stroke, congestive heart  failure, dementia, kidney failure, and premature death. Long-term effects of excessive salt intake include stiffening of the arteries and thickening of heart muscle and organ damage. Recommendations include ways to reduce hypertension and the risk of heart disease.  Diseases of Our Time - Focusing on Diabetes Clinical staff conducted group or individual video education with verbal and written material and guidebook.  Patient learns why the best way to stop diseases of our time is prevention, through food and other lifestyle changes. Medicine (such as prescription pills and surgeries) is often only a Band-Aid on the problem, not a long-term solution. Most common diseases of our time include obesity, type 2 diabetes, hypertension, heart disease, and cancer. The Pritikin Program is recommended and has been proven to help reduce, reverse, and/or prevent the damaging effects of metabolic syndrome.  Nutrition   Overview of the Pritikin Eating Plan  Clinical staff conducted group or individual video education with verbal and written material and guidebook.  Patient learns about the Pritikin Eating Plan for disease risk reduction. The Pritikin Eating Plan emphasizes a wide variety of unrefined, minimally-processed carbohydrates, like fruits, vegetables, whole grains, and legumes. Go, Caution, and Stop food choices are explained. Plant-based and lean animal proteins are emphasized. Rationale provided for low sodium intake for blood pressure control, low added sugars for blood sugar stabilization, and low added fats and oils for coronary artery disease risk reduction and weight management.  Calorie Density  Clinical staff conducted group or individual video education with verbal and written material and guidebook.  Patient learns about calorie density and how it impacts the Pritikin Eating Plan. Knowing the characteristics of the food you choose will help you decide whether those foods will lead to weight gain or  weight loss, and whether you want to consume more or less of them. Weight loss is usually a side effect of the Pritikin Eating Plan because of its focus on low calorie-dense foods.  Label Reading  Clinical staff conducted group or individual video education with verbal and written material and guidebook.  Patient learns about the Pritikin recommended label reading guidelines and corresponding recommendations regarding calorie  density, added sugars, sodium content, and whole grains.  Dining Out - Part 1  Clinical staff conducted group or individual video education with verbal and written material and guidebook.  Patient learns that restaurant meals can be sabotaging because they can be so high in calories, fat, sodium, and/or sugar. Patient learns recommended strategies on how to positively address this and avoid unhealthy pitfalls.  Facts on Fats  Clinical staff conducted group or individual video education with verbal and written material and guidebook.  Patient learns that lifestyle modifications can be just as effective, if not more so, as many medications for lowering your risk of heart disease. A Pritikin lifestyle can help to reduce your risk of inflammation and atherosclerosis (cholesterol build-up, or plaque, in the artery walls). Lifestyle interventions such as dietary choices and physical activity address the cause of atherosclerosis. A review of the types of fats and their impact on blood cholesterol levels, along with dietary recommendations to reduce fat intake is also included.  Nutrition Action Plan  Clinical staff conducted group or individual video education with verbal and written material and guidebook.  Patient learns how to incorporate Pritikin recommendations into their lifestyle. Recommendations include planning and keeping personal health goals in mind as an important part of their success.  Healthy Mind-Set    Healthy Minds, Bodies, Hearts  Clinical staff conducted group  or individual video education with verbal and written material and guidebook.  Patient learns how to identify when they are stressed. Video will discuss the impact of that stress, as well as the many benefits of stress management. Patient will also be introduced to stress management techniques. The way we think, act, and feel has an impact on our hearts.  How Our Thoughts Can Heal Our Hearts  Clinical staff conducted group or individual video education with verbal and written material and guidebook.  Patient learns that negative thoughts can cause depression and anxiety. This can result in negative lifestyle behavior and serious health problems. Cognitive behavioral therapy is an effective method to help control our thoughts in order to change and improve our emotional outlook.  Additional Videos:  Exercise    Improving Performance  Clinical staff conducted group or individual video education with verbal and written material and guidebook.  Patient learns to use a non-linear approach by alternating intensity levels and lengths of time spent exercising to help burn more calories and lose more body fat. Cardiovascular exercise helps improve heart health, metabolism, hormonal balance, blood sugar control, and recovery from fatigue. Resistance training improves strength, endurance, balance, coordination, reaction time, metabolism, and muscle mass. Flexibility exercise improves circulation, posture, and balance. Seek guidance from your physician and exercise physiologist before implementing an exercise routine and learn your capabilities and proper form for all exercise.  Introduction to Yoga  Clinical staff conducted group or individual video education with verbal and written material and guidebook.  Patient learns about yoga, a discipline of the coming together of mind, breath, and body. The benefits of yoga include improved flexibility, improved range of motion, better posture and core strength,  increased lung function, weight loss, and positive self-image. Yoga's heart health benefits include lowered blood pressure, healthier heart rate, decreased cholesterol and triglyceride levels, improved immune function, and reduced stress. Seek guidance from your physician and exercise physiologist before implementing an exercise routine and learn your capabilities and proper form for all exercise.  Medical   Aging: Enhancing Your Quality of Life  Clinical staff conducted group or individual video education with  verbal and written material and guidebook.  Patient learns key strategies and recommendations to stay in good physical health and enhance quality of life, such as prevention strategies, having an advocate, securing a Health Care Proxy and Power of Attorney, and keeping a list of medications and system for tracking them. It also discusses how to avoid risk for bone loss.  Biology of Weight Control  Clinical staff conducted group or individual video education with verbal and written material and guidebook.  Patient learns that weight gain occurs because we consume more calories than we burn (eating more, moving less). Even if your body weight is normal, you may have higher ratios of fat compared to muscle mass. Too much body fat puts you at increased risk for cardiovascular disease, heart attack, stroke, type 2 diabetes, and obesity-related cancers. In addition to exercise, following the Pritikin Eating Plan can help reduce your risk.  Decoding Lab Results  Clinical staff conducted group or individual video education with verbal and written material and guidebook.  Patient learns that lab test reflects one measurement whose values change over time and are influenced by many factors, including medication, stress, sleep, exercise, food, hydration, pre-existing medical conditions, and more. It is recommended to use the knowledge from this video to become more involved with your lab results and  evaluate your numbers to speak with your doctor.   Diseases of Our Time - Overview  Clinical staff conducted group or individual video education with verbal and written material and guidebook.  Patient learns that according to the CDC, 50% to 70% of chronic diseases (such as obesity, type 2 diabetes, elevated lipids, hypertension, and heart disease) are avoidable through lifestyle improvements including healthier food choices, listening to satiety cues, and increased physical activity.  Sleep Disorders Clinical staff conducted group or individual video education with verbal and written material and guidebook.  Patient learns how good quality and duration of sleep are important to overall health and well-being. Patient also learns about sleep disorders and how they impact health along with recommendations to address them, including discussing with a physician.  Nutrition  Dining Out - Part 2 Clinical staff conducted group or individual video education with verbal and written material and guidebook.  Patient learns how to plan ahead and communicate in order to maximize their dining experience in a healthy and nutritious manner. Included are recommended food choices based on the type of restaurant the patient is visiting.   Fueling a Banker conducted group or individual video education with verbal and written material and guidebook.  There is a strong connection between our food choices and our health. Diseases like obesity and type 2 diabetes are very prevalent and are in large-part due to lifestyle choices. The Pritikin Eating Plan provides plenty of food and hunger-curbing satisfaction. It is easy to follow, affordable, and helps reduce health risks.  Menu Workshop  Clinical staff conducted group or individual video education with verbal and written material and guidebook.  Patient learns that restaurant meals can sabotage health goals because they are often packed with  calories, fat, sodium, and sugar. Recommendations include strategies to plan ahead and to communicate with the manager, chef, or server to help order a healthier meal.  Planning Your Eating Strategy  Clinical staff conducted group or individual video education with verbal and written material and guidebook.  Patient learns about the Pritikin Eating Plan and its benefit of reducing the risk of disease. The Pritikin Eating Plan does not focus  on calories. Instead, it emphasizes high-quality, nutrient-rich foods. By knowing the characteristics of the foods, we choose, we can determine their calorie density and make informed decisions.  Targeting Your Nutrition Priorities  Clinical staff conducted group or individual video education with verbal and written material and guidebook.  Patient learns that lifestyle habits have a tremendous impact on disease risk and progression. This video provides eating and physical activity recommendations based on your personal health goals, such as reducing LDL cholesterol, losing weight, preventing or controlling type 2 diabetes, and reducing high blood pressure.  Vitamins and Minerals  Clinical staff conducted group or individual video education with verbal and written material and guidebook.  Patient learns different ways to obtain key vitamins and minerals, including through a recommended healthy diet. It is important to discuss all supplements you take with your doctor.   Healthy Mind-Set    Smoking Cessation  Clinical staff conducted group or individual video education with verbal and written material and guidebook.  Patient learns that cigarette smoking and tobacco addiction pose a serious health risk which affects millions of people. Stopping smoking will significantly reduce the risk of heart disease, lung disease, and many forms of cancer. Recommended strategies for quitting are covered, including working with your doctor to develop a successful  plan.  Culinary   Becoming a Set designer conducted group or individual video education with verbal and written material and guidebook.  Patient learns that cooking at home can be healthy, cost-effective, quick, and puts them in control. Keys to cooking healthy recipes will include looking at your recipe, assessing your equipment needs, planning ahead, making it simple, choosing cost-effective seasonal ingredients, and limiting the use of added fats, salts, and sugars.  Cooking - Breakfast and Snacks  Clinical staff conducted group or individual video education with verbal and written material and guidebook.  Patient learns how important breakfast is to satiety and nutrition through the entire day. Recommendations include key foods to eat during breakfast to help stabilize blood sugar levels and to prevent overeating at meals later in the day. Planning ahead is also a key component.  Cooking - Educational psychologist conducted group or individual video education with verbal and written material and guidebook.  Patient learns eating strategies to improve overall health, including an approach to cook more at home. Recommendations include thinking of animal protein as a side on your plate rather than center stage and focusing instead on lower calorie dense options like vegetables, fruits, whole grains, and plant-based proteins, such as beans. Making sauces in large quantities to freeze for later and leaving the skin on your vegetables are also recommended to maximize your experience.  Cooking - Healthy Salads and Dressing Clinical staff conducted group or individual video education with verbal and written material and guidebook.  Patient learns that vegetables, fruits, whole grains, and legumes are the foundations of the Pritikin Eating Plan. Recommendations include how to incorporate each of these in flavorful and healthy salads, and how to create homemade salad dressings.  Proper handling of ingredients is also covered. Cooking - Soups and [employer] - Soups and Desserts Clinical staff conducted group or individual video education with verbal and written material and guidebook.  Patient learns that Pritikin soups and desserts make for easy, nutritious, and delicious snacks and meal components that are low in sodium, fat, sugar, and calorie density, while high in vitamins, minerals, and filling fiber. Recommendations include simple and healthy ideas for soups  and desserts.   Overview     The Pritikin Solution Program Overview Clinical staff conducted group or individual video education with verbal and written material and guidebook.  Patient learns that the results of the Pritikin Program have been documented in more than 100 articles published in peer-reviewed journals, and the benefits include reducing risk factors for (and, in some cases, even reversing) high cholesterol, high blood pressure, type 2 diabetes, obesity, and more! An overview of the three key pillars of the Pritikin Program will be covered: eating well, doing regular exercise, and having a healthy mind-set.  WORKSHOPS  Exercise: Exercise Basics: Building Your Action Plan Clinical staff led group instruction and group discussion with PowerPoint presentation and patient guidebook. To enhance the learning environment the use of posters, models and videos may be added. At the conclusion of this workshop, patients will comprehend the difference between physical activity and exercise, as well as the benefits of incorporating both, into their routine. Patients will understand the FITT (Frequency, Intensity, Time, and Type) principle and how to use it to build an exercise action plan. In addition, safety concerns and other considerations for exercise and cardiac rehab will be addressed by the presenter. The purpose of this lesson is to promote a comprehensive and effective weekly exercise routine in  order to improve patients' overall level of fitness.   Managing Heart Disease: Your Path to a Healthier Heart Clinical staff led group instruction and group discussion with PowerPoint presentation and patient guidebook. To enhance the learning environment the use of posters, models and videos may be added.At the conclusion of this workshop, patients will understand the anatomy and physiology of the heart. Additionally, they will understand how Pritikin's three pillars impact the risk factors, the progression, and the management of heart disease.  The purpose of this lesson is to provide a high-level overview of the heart, heart disease, and how the Pritikin lifestyle positively impacts risk factors.  Exercise Biomechanics Clinical staff led group instruction and group discussion with PowerPoint presentation and patient guidebook. To enhance the learning environment the use of posters, models and videos may be added. Patients will learn how the structural parts of their bodies function and how these functions impact their daily activities, movement, and exercise. Patients will learn how to promote a neutral spine, learn how to manage pain, and identify ways to improve their physical movement in order to promote healthy living. The purpose of this lesson is to expose patients to common physical limitations that impact physical activity. Participants will learn practical ways to adapt and manage aches and pains, and to minimize their effect on regular exercise. Patients will learn how to maintain good posture while sitting, walking, and lifting.  Balance Training and Fall Prevention  Clinical staff led group instruction and group discussion with PowerPoint presentation and patient guidebook. To enhance the learning environment the use of posters, models and videos may be added. At the conclusion of this workshop, patients will understand the importance of their sensorimotor skills (vision,  proprioception, and the vestibular system) in maintaining their ability to balance as they age. Patients will apply a variety of balancing exercises that are appropriate for their current level of function. Patients will understand the common causes for poor balance, possible solutions to these problems, and ways to modify their physical environment in order to minimize their fall risk. The purpose of this lesson is to teach patients about the importance of maintaining balance as they age and ways to minimize their risk  of falling.  WORKSHOPS   Nutrition:  Fueling a Ship broker led group instruction and group discussion with PowerPoint presentation and patient guidebook. To enhance the learning environment the use of posters, models and videos may be added. Patients will review the foundational principles of the Pritikin Eating Plan and understand what constitutes a serving size in each of the food groups. Patients will also learn Pritikin-friendly foods that are better choices when away from home and review make-ahead meal and snack options. Calorie density will be reviewed and applied to three nutrition priorities: weight maintenance, weight loss, and weight gain. The purpose of this lesson is to reinforce (in a group setting) the key concepts around what patients are recommended to eat and how to apply these guidelines when away from home by planning and selecting Pritikin-friendly options. Patients will understand how calorie density may be adjusted for different weight management goals.  Mindful Eating  Clinical staff led group instruction and group discussion with PowerPoint presentation and patient guidebook. To enhance the learning environment the use of posters, models and videos may be added. Patients will briefly review the concepts of the Pritikin Eating Plan and the importance of low-calorie dense foods. The concept of mindful eating will be introduced as well as the  importance of paying attention to internal hunger signals. Triggers for non-hunger eating and techniques for dealing with triggers will be explored. The purpose of this lesson is to provide patients with the opportunity to review the basic principles of the Pritikin Eating Plan, discuss the value of eating mindfully and how to measure internal cues of hunger and fullness using the Hunger Scale. Patients will also discuss reasons for non-hunger eating and learn strategies to use for controlling emotional eating.  Targeting Your Nutrition Priorities Clinical staff led group instruction and group discussion with PowerPoint presentation and patient guidebook. To enhance the learning environment the use of posters, models and videos may be added. Patients will learn how to determine their genetic susceptibility to disease by reviewing their family history. Patients will gain insight into the importance of diet as part of an overall healthy lifestyle in mitigating the impact of genetics and other environmental insults. The purpose of this lesson is to provide patients with the opportunity to assess their personal nutrition priorities by looking at their family history, their own health history and current risk factors. Patients will also be able to discuss ways of prioritizing and modifying the Pritikin Eating Plan for their highest risk areas  Menu  Clinical staff led group instruction and group discussion with PowerPoint presentation and patient guidebook. To enhance the learning environment the use of posters, models and videos may be added. Using menus brought in from [employer], or printed from [employer], patients will apply the Pritikin dining out guidelines that were presented in the [employer] video. Patients will also be able to practice these guidelines in a variety of provided scenarios. The purpose of this lesson is to provide patients with the opportunity to practice  hands-on learning of the Pritikin Dining Out guidelines with actual menus and practice scenarios.  Label Reading Clinical staff led group instruction and group discussion with PowerPoint presentation and patient guidebook. To enhance the learning environment the use of posters, models and videos may be added. Patients will review and discuss the Pritikin label reading guidelines presented in Pritikin's Label Reading Educational series video. Using fool labels brought in from local grocery stores and markets, patients will apply the  label reading guidelines and determine if the packaged food meet the Pritikin guidelines. The purpose of this lesson is to provide patients with the opportunity to review, discuss, and practice hands-on learning of the Pritikin Label Reading guidelines with actual packaged food labels. Cooking School  Pritikin's [employer] are designed to teach patients ways to prepare quick, simple, and affordable recipes at home. The importance of nutrition's role in chronic disease risk reduction is reflected in its emphasis in the overall Pritikin program. By learning how to prepare essential core Pritikin Eating Plan recipes, patients will increase control over what they eat; be able to customize the flavor of foods without the use of added salt, sugar, or fat; and improve the quality of the food they consume. By learning a set of core recipes which are easily assembled, quickly prepared, and affordable, patients are more likely to prepare more healthy foods at home. These workshops focus on convenient breakfasts, simple entres, side dishes, and desserts which can be prepared with minimal effort and are consistent with nutrition recommendations for cardiovascular risk reduction. Cooking [employer] are taught by a Armed forces logistics/support/administrative officer (RD) who has been trained by the [employer]. The chef or RD has a clear understanding of the importance of minimizing -  if not completely eliminating - added fat, sugar, and sodium in recipes. Throughout the series of Cooking School Workshop sessions, patients will learn about healthy ingredients and efficient methods of cooking to build confidence in their capability to prepare    Cooking School weekly topics:  Adding Flavor- Sodium-Free  Fast and Healthy Breakfasts  Powerhouse Plant-Based Proteins  Satisfying Salads and Dressings  Simple Sides and Sauces  International Cuisine-Spotlight on the [employer] Zones  Delicious Desserts  Savory Soups  [employer] - Meals in a Astronomer Appetizers and Snacks  Comforting Weekend Breakfasts  One-Pot Wonders   Fast Evening Meals  Landscape architect Your Pritikin Plate  WORKSHOPS   Healthy Mindset (Psychosocial):  Focused Goals, Sustainable Changes Clinical staff led group instruction and group discussion with PowerPoint presentation and patient guidebook. To enhance the learning environment the use of posters, models and videos may be added. Patients will be able to apply effective goal setting strategies to establish at least one personal goal, and then take consistent, meaningful action toward that goal. They will learn to identify common barriers to achieving personal goals and develop strategies to overcome them. Patients will also gain an understanding of how our mind-set can impact our ability to achieve goals and the importance of cultivating a positive and growth-oriented mind-set. The purpose of this lesson is to provide patients with a deeper understanding of how to set and achieve personal goals, as well as the tools and strategies needed to overcome common obstacles which may arise along the way.  From Head to Heart: The Power of a Healthy Outlook  Clinical staff led group instruction and group discussion with PowerPoint presentation and patient guidebook. To enhance the learning environment the use of posters, models and videos may be  added. Patients will be able to recognize and describe the impact of emotions and mood on physical health. They will discover the importance of self-care and explore self-care practices which may work for them. Patients will also learn how to utilize the 4 C's to cultivate a healthier outlook and better manage stress and challenges. The purpose of this lesson is to demonstrate to patients how a healthy outlook is an essential  part of maintaining good health, especially as they continue their cardiac rehab journey.  Healthy Sleep for a Healthy Heart Clinical staff led group instruction and group discussion with PowerPoint presentation and patient guidebook. To enhance the learning environment the use of posters, models and videos may be added. At the conclusion of this workshop, patients will be able to demonstrate knowledge of the importance of sleep to overall health, well-being, and quality of life. They will understand the symptoms of, and treatments for, common sleep disorders. Patients will also be able to identify daytime and nighttime behaviors which impact sleep, and they will be able to apply these tools to help manage sleep-related challenges. The purpose of this lesson is to provide patients with a general overview of sleep and outline the importance of quality sleep. Patients will learn about a few of the most common sleep disorders. Patients will also be introduced to the concept of "sleep hygiene," and discover ways to self-manage certain sleeping problems through simple daily behavior changes. Finally, the workshop will motivate patients by clarifying the links between quality sleep and their goals of heart-healthy living.   Recognizing and Reducing Stress Clinical staff led group instruction and group discussion with PowerPoint presentation and patient guidebook. To enhance the learning environment the use of posters, models and videos may be added. At the conclusion of this workshop, patients  will be able to understand the types of stress reactions, differentiate between acute and chronic stress, and recognize the impact that chronic stress has on their health. They will also be able to apply different coping mechanisms, such as reframing negative self-talk. Patients will have the opportunity to practice a variety of stress management techniques, such as deep abdominal breathing, progressive muscle relaxation, and/or guided imagery.  The purpose of this lesson is to educate patients on the role of stress in their lives and to provide healthy techniques for coping with it.  Learning Barriers/Preferences:  Learning Barriers/Preferences - 02/23/23 0843       Learning Barriers/Preferences   Learning Barriers Hearing   hearing aids   Learning Preferences Audio;Computer/Internet;Group Instruction;Individual Instruction;Skilled Demonstration;Verbal Instruction;Video;Written Material;Pictoral             Education Topics:  Knowledge Questionnaire Score:  Knowledge Questionnaire Score - 02/23/23 0843       Knowledge Questionnaire Score   Pre Score 22/24             Core Components/Risk Factors/Patient Goals at Admission:  Personal Goals and Risk Factors at Admission - 02/23/23 0845       Core Components/Risk Factors/Patient Goals on Admission    Weight Management Yes;Weight Maintenance    Intervention Weight Management: Develop a combined nutrition and exercise program designed to reach desired caloric intake, while maintaining appropriate intake of nutrient and fiber, sodium and fats, and appropriate energy expenditure required for the weight goal.;Weight Management: Provide education and appropriate resources to help participant work on and attain dietary goals.    Expected Outcomes Short Term: Continue to assess and modify interventions until short term weight is achieved;Long Term: Adherence to nutrition and physical activity/exercise program aimed toward attainment of  established weight goal;Weight Maintenance: Understanding of the daily nutrition guidelines, which includes 25-35% calories from fat, 7% or less cal from saturated fats, less than 200mg  cholesterol, less than 1.5gm of sodium, & 5 or more servings of fruits and vegetables daily;Understanding recommendations for meals to include 15-35% energy as protein, 25-35% energy from fat, 35-60% energy from carbohydrates, less than 200mg  of  dietary cholesterol, 20-35 gm of total fiber daily;Understanding of distribution of calorie intake throughout the day with the consumption of 4-5 meals/snacks    Heart Failure Yes    Intervention Provide a combined exercise and nutrition program that is supplemented with education, support and counseling about heart failure. Directed toward relieving symptoms such as shortness of breath, decreased exercise tolerance, and extremity edema.    Expected Outcomes Improve functional capacity of life;Short term: Attendance in program 2-3 days a week with increased exercise capacity. Reported lower sodium intake. Reported increased fruit and vegetable intake. Reports medication compliance.;Short term: Daily weights obtained and reported for increase. Utilizing diuretic protocols set by physician.;Long term: Adoption of self-care skills and reduction of barriers for early signs and symptoms recognition and intervention leading to self-care maintenance.    Hypertension Yes    Intervention Provide education on lifestyle modifcations including regular physical activity/exercise, weight management, moderate sodium restriction and increased consumption of fresh fruit, vegetables, and low fat dairy, alcohol moderation, and smoking cessation.;Monitor prescription use compliance.    Expected Outcomes Short Term: Continued assessment and intervention until BP is < 140/27mm HG in hypertensive participants. < 130/1mm HG in hypertensive participants with diabetes, heart failure or chronic kidney  disease.;Long Term: Maintenance of blood pressure at goal levels.    Lipids Yes    Intervention Provide education and support for participant on nutrition & aerobic/resistive exercise along with prescribed medications to achieve LDL 70mg , HDL >40mg .    Expected Outcomes Short Term: Participant states understanding of desired cholesterol values and is compliant with medications prescribed. Participant is following exercise prescription and nutrition guidelines.;Long Term: Cholesterol controlled with medications as prescribed, with individualized exercise RX and with personalized nutrition plan. Value goals: LDL < 70mg , HDL > 40 mg.             Core Components/Risk Factors/Patient Goals Review:   Goals and Risk Factor Review     Row Name 02/28/23 1440 03/16/23 1531 04/17/23 0738 05/04/23 0938       Core Components/Risk Factors/Patient Goals Review   Personal Goals Review Weight Management/Obesity;Heart Failure;Hypertension;Lipids Weight Management/Obesity;Heart Failure;Hypertension;Lipids Weight Management/Obesity;Heart Failure;Hypertension;Lipids Weight Management/Obesity;Heart Failure;Hypertension;Lipids    Review Brendan Holland started cardiac rehab on 02/28/23. Brendan Holland did well with exercise. Vital signs were stable. Brendan Holland is doing  well with exercise at cardiac rehab. Vital signs have been stable. Brendan Holland has been increasing his workloads without difficulty. Brendan Holland continues to do  well with exercise at cardiac rehab. Vital signs have been stable. Brendan Holland has been increasing his workloads without difficulty. Brendan Holland continues to do  well with exercise at cardiac rehab. Vital signs remain been stable. Brendan Holland has been increasing his workloads without difficulty. Brendan Holland has lost 1.8 kg since starting cardiac rehab    Expected Outcomes Brendan Holland Will continue to participate in cardiac rehab for exercise, nutrtion and lifestyle modifications. Brendan Holland Will continue to participate in cardiac rehab for exercise, nutrtion and  lifestyle modifications. Brendan Holland Will continue to participate in cardiac rehab for exercise, nutrtion and lifestyle modifications. Brendan Holland Will continue to participate in cardiac rehab for exercise, nutrtion and lifestyle modifications.             Core Components/Risk Factors/Patient Goals at Discharge (Final Review):   Goals and Risk Factor Review - 05/04/23 0938       Core Components/Risk Factors/Patient Goals Review   Personal Goals Review Weight Management/Obesity;Heart Failure;Hypertension;Lipids    Review Brendan Holland continues to do  well with exercise at cardiac rehab. Vital signs remain been stable. Brendan Holland  has been increasing his workloads without difficulty. Brendan Holland has lost 1.8 kg since starting cardiac rehab    Expected Outcomes Brendan Holland Will continue to participate in cardiac rehab for exercise, nutrtion and lifestyle modifications.             ITP Comments:  ITP Comments     Row Name 02/23/23 [phone] 02/28/23 1428 03/16/23 1529 04/17/23 0736 05/04/23 0934   ITP Comments Dr. Gaylyn Keas medical director. Introduction to pritikin education/intensive cardiac rehab. Initial orientation packet reviewed with patient. 30 Day ITP Review. Brendan Holland started cardiac rehab on 02/28/23. Brendan Holland did well with exercise. 30 Day ITP Review. Brendan Holland has good attendance and participation with exercise at cardiac rehab 30 Day ITP Review. Brendan Holland continues to have good attendance and participation with exercise at cardiac rehab 30 Day ITP Review. Brendan Holland continues to have good attendance and participation with exercise at cardiac rehab            Comments: See ITP Comments

## 2023-05-09 ENCOUNTER — Telehealth (HOSPITAL_COMMUNITY): Payer: Self-pay

## 2023-05-09 ENCOUNTER — Encounter (HOSPITAL_COMMUNITY): Admission: RE | Admit: 2023-05-09 | Payer: PPO | Source: Ambulatory Visit

## 2023-05-09 NOTE — Telephone Encounter (Signed)
 Patient c/o for 10:15am class due to wife in hospital, will be back Friday.

## 2023-05-11 ENCOUNTER — Encounter (HOSPITAL_COMMUNITY): Admission: RE | Admit: 2023-05-11 | Payer: PPO | Source: Ambulatory Visit

## 2023-05-11 ENCOUNTER — Telehealth (HOSPITAL_COMMUNITY): Payer: Self-pay

## 2023-05-11 ENCOUNTER — Other Ambulatory Visit (HOSPITAL_COMMUNITY): Payer: Self-pay | Admitting: Adult Health

## 2023-05-11 NOTE — Telephone Encounter (Signed)
 Patient left message stating that he fell this morning and is getting stitches in his eye, but will be back Monday for class.

## 2023-05-14 ENCOUNTER — Telehealth (HOSPITAL_COMMUNITY): Payer: Self-pay

## 2023-05-14 ENCOUNTER — Telehealth (HOSPITAL_COMMUNITY): Payer: Self-pay | Admitting: *Deleted

## 2023-05-14 ENCOUNTER — Encounter (HOSPITAL_COMMUNITY): Admission: RE | Admit: 2023-05-14 | Payer: PPO | Source: Ambulatory Visit

## 2023-05-14 NOTE — Telephone Encounter (Signed)
 Left message to call cardiac rehab regarding recent fall and return to exercise at cardiac rehab.Monte Antonio RN BSN

## 2023-05-14 NOTE — Telephone Encounter (Signed)
 Patient fell on Friday and received stitches in his eye and lip, can't see to drive. Call out for class today and Wednesday, hopes to be in Friday. Is concerned his fall might be related to one of his medications.

## 2023-05-16 ENCOUNTER — Encounter (HOSPITAL_COMMUNITY): Admission: RE | Admit: 2023-05-16 | Payer: PPO | Source: Ambulatory Visit

## 2023-05-18 ENCOUNTER — Telehealth (HOSPITAL_COMMUNITY): Payer: Self-pay | Admitting: *Deleted

## 2023-05-18 ENCOUNTER — Encounter (HOSPITAL_COMMUNITY)
Admission: RE | Admit: 2023-05-18 | Discharge: 2023-05-18 | Disposition: A | Discharge status: Home / Self Care | Source: Ambulatory Visit | Attending: Internal Medicine | Admitting: Internal Medicine

## 2023-05-18 DIAGNOSIS — Z955 Presence of coronary angioplasty implant and graft: Secondary | ICD-10-CM | POA: Insufficient documentation

## 2023-05-18 DIAGNOSIS — I2102 ST elevation (STEMI) myocardial infarction involving left anterior descending coronary artery: Secondary | ICD-10-CM | POA: Insufficient documentation

## 2023-05-18 NOTE — Telephone Encounter (Signed)
 Left message to call cardiac rehab.Received message that Brendan Holland called due to his recent fall. Checking to see if Brendan Holland has followed up with his primary care provider for follow up.Monte Antonio RN BSN

## 2023-05-21 ENCOUNTER — Encounter (HOSPITAL_COMMUNITY)
Admission: RE | Admit: 2023-05-21 | Discharge: 2023-05-21 | Disposition: A | Discharge status: Home / Self Care | Source: Ambulatory Visit | Attending: Internal Medicine | Admitting: Internal Medicine

## 2023-05-21 DIAGNOSIS — I2102 ST elevation (STEMI) myocardial infarction involving left anterior descending coronary artery: Secondary | ICD-10-CM

## 2023-05-21 DIAGNOSIS — Z955 Presence of coronary angioplasty implant and graft: Secondary | ICD-10-CM

## 2023-05-21 NOTE — Progress Notes (Signed)
 Patient reported to resume exercise post fall at home saw PCP at [facility] this AM. Has resolving bruise to face. No complaints this morning. Blood pressure 132/80 this AM. Telemetry rhythm sinus 61. Consuelo Denmark said he actually participated in a 5 K with his daughter this weekend. Discussed with onsite provider Slater Duncan NP. Okay to resume exercise.Will continue to monitor the patient throughout  the program.

## 2023-05-23 ENCOUNTER — Encounter (HOSPITAL_COMMUNITY)
Admission: RE | Admit: 2023-05-23 | Discharge: 2023-05-23 | Discharge status: Home / Self Care | Source: Ambulatory Visit | Attending: Internal Medicine

## 2023-05-23 DIAGNOSIS — I2102 ST elevation (STEMI) myocardial infarction involving left anterior descending coronary artery: Secondary | ICD-10-CM | POA: Diagnosis not present

## 2023-05-23 DIAGNOSIS — Z955 Presence of coronary angioplasty implant and graft: Secondary | ICD-10-CM

## 2023-05-23 NOTE — Progress Notes (Signed)
 " Cardiology Office Note    Patient Name: Brendan Holland Date of Encounter: 05/23/2023  Primary Care Provider:  Leonce Sink, MD Primary Cardiologist:  Lurena MARLA Red, MD Primary Electrophysiologist: None   Past Medical History    Past Medical History:  Diagnosis Date   Arthritis    arthritis right knee   Cancer (HCC)    skin cancer of scalp -tx with topical meds-all clear now   Dementia (HCC)    pre Alzheimers MCIconitive impairment.  Negative on latest testing   DVT of lower extremity (deep venous thrombosis) (HCC)    GERD (gastroesophageal reflux disease)    Headache(784.0)    Heart rate slow    Avid runner 30 miles per week   Hepatitis C    Tx. Harvoni- 3 yrs ago- now Clear   Sleep apnea    no cpap use todaycondition improved.  Most recent study neg   Stroke [facility])     History of Present Illness  Brendan Holland is a 73 y.o. male with a PMH of CAD s/p anterior STEMI with cath showing occluded mid LAD, 90% stenosis OM1 branch, 70% dRCA.  DES to LAD.  Returned to the lab for PCI DES OM and RCA  HLD, early onset Alzheimer's, bilateral carotid stenosis (1-39%) HFrEF, ICM, VT s/p DCCV x 2, OSA DVT who presents today for 3 month follow up.  Brendan Holland was seen following his STEMI on 02/15/2023 and reported tolerating all of his prescribed medications and denies any adverse reactions. He started cardiac rehab and underwent carotid doppler that showed bilateral (1-39% stenosis).  He underwent a repeat 2D echo on 04/2023 that showed improved EF of 55 to 60%.  Brendan Holland presents today for three month follow up. He experienced a syncopal episode on May 2nd at 6:00 AM at home. after waking up with a cramp in his left leg, he felt lightheaded and subsequently lost consciousness, resulting in a hematoma and four stitches in his lip, as well as a knee injury. He was taken to the ER where a CT scan showed no fractures. He later had a follow-up to remove stitches.  He reports  no palpitations, skipped heartbeats, or dizziness were noted prior to the episode. He has a history of a myocardial infarction on January 30th, after which he was started on cardiac rehabilitation and medications including aspirin , cholesterol medication, metoprolol , Brilinta , and Jardiance . His heart function, previously reduced to 30%, has improved to 65%. He experiences some fatigue and acknowledges he cannot perform as he did prior to the heart attack. He takes his medications in the morning and at night, but sometimes forgets to take his blood pressure at night. He is on a half tablet of spironolactone . He has been participating in cardiac rehab and resumed exercise after resolving facial bruises. He missed some sessions due to swelling from the hematoma but has since returned. He was supposed to graduate from rehab but extended it by a week. He plans to participate in a 5K with his daughter. He has a history of knee replacement surgery and reports knee pain from the fall. He has varicose veins and has previously undergone vein surgery. He has not experienced pain in his calves or legs while walking, nor has he had to sit down due to pain. He recalls a similar episode in 2007 when he had a leg cramp, got up, and fell, hitting his head. He has had ultrasounds on his legs and carotids, with the latter  showing some narrowing. Patient denies chest pain, palpitations, dyspnea, PND, orthopnea, nausea, vomiting, dizziness, syncope, edema, weight gain, or early satiety.  Discussed the use of AI scribe software for clinical note transcription with the patient, who gave verbal consent to proceed.  History of Present Illness    Review of Systems  Please see the history of present illness.    All other systems reviewed and are otherwise negative except as noted above.  Physical Exam    Wt Readings from Last 3 Encounters:  02/23/23 171 lb 1.2 oz (77.6 kg)  02/15/23 172 lb (78 kg)  02/05/23 170 lb 6.7 oz  (77.3 kg)   CD:Uyzmz were no vitals filed for this visit.,There is no height or weight on file to calculate BMI. GEN: Well nourished, well developed in no acute distress Neck: No JVD; No carotid bruits Pulmonary: Clear to auscultation without rales, wheezing or rhonchi  Cardiovascular: Normal rate. Regular rhythm. Normal S1. Normal S2.   Murmurs: There is no murmur.  ABDOMEN: Soft, non-tender, non-distended EXTREMITIES:  No edema; No deformity   EKG/LABS/ Recent Cardiac Studies   ECG personally reviewed by me today - None completed today   Risk Assessment/Calculations:          Lab Results  Component Value Date   WBC 8.2 02/06/2023   HGB 13.6 02/06/2023   HCT 37.7 (L) 02/06/2023   MCV 92.6 02/06/2023   PLT 141 (L) 02/06/2023   Lab Results  Component Value Date   CREATININE 1.20 02/15/2023   BUN 17 02/15/2023   NA 140 02/15/2023   K 4.7 02/15/2023   CL 100 02/15/2023   CO2 23 02/15/2023   Lab Results  Component Value Date   CHOL 140 04/12/2023   HDL 52 04/12/2023   LDLCALC 72 04/12/2023   TRIG 85 04/12/2023   CHOLHDL 2.7 04/12/2023    Lab Results  Component Value Date   HGBA1C 5.3 02/03/2023   Assessment & Plan   Assessment & Plan   1.  Coronary artery disease: -s/p anterior STEMI with cath showing occluded mid LAD, 90% stenosis OM1 branch, 70% dRCA.  DES to LAD.  Returned to the lab for PCI DES OM and RCA  -Today patient reports no chest pain or shortness of breath since his PCI. -He is fine to proceed with cardiac rehab. -He does report some possible cost prohibition with Brilinta  and was provided patient assistance paperwork -Continue current GDMT with ASA 81 mg, Toprol  25 mg daily, Brilinta  90 mg twice daily, Lipitor 80 mg daily   2.  HFpEF/ICM:  -Heart function improved from 30% to 65% following most recent 2D echo. -Today patient is euvolemic on examination - Discontinue spironolactone  25 mg daily due to orthostasis, and reduce Toprol -XL 12.5 mg -  Continue Jardiance  10 mg daily  3.  NSVT/junctional rhythm: -Patient had VT during initial PCI requiring DCCV x 2 with bouts of intermittent junctional rhythm that resolved with amiodarone . - Patient reports episode of syncope on May 2nd, possibly due to orthostatic hypotension or arrhythmia. CT scan normal.  -Discontinuing spironolactone  may reduce hypotension risk. Heart monitor to evaluate arrhythmias. - Order 14-day heart monitor to evaluate for arrhythmias. - Discontinue spironolactone  and reduce Toprol -XL to 12.5 mg to reduce risk of hypotension.   4.  Orthostatic hypotension Potential contributor to syncope. Blood pressure may be too low due to medications.  - Orthostatic BPs completed and were positive from sitting to standing - We will discontinue spironolactone  25 mg and reduced  Toprol -XL to 12.5 mg - Per [place]  DMV patient should abstain from driving for least 6 months until determination is made regarding cause of syncopal episode.  Orthostatic VS for the past 24 hrs (Last 3 readings):  BP- Lying Pulse- Lying BP- Sitting Pulse- Sitting BP- Standing at 0 minutes Pulse- Standing at 0 minutes BP- Standing at 3 minutes Pulse- Standing at 3 minutes  05/24/23 1139 120/67 71 117/71 71 98/64 71 110/71 71      5.  Hyperlipidemia: -Patient's last LDL cholesterol was 72 -Continue atorvastatin  80 mg daily  6. Carotid artery stenosis: Carotid doppler showed mild narrowing. Current medications are optimal treatment. - Continue current medications including aspirin  and cholesterol medication.  Disposition: Follow-up with Arun K Thukkani, MD or APP in 6 months    Signed, Wyn Raddle, Jackee Shove, NP 05/23/2023, 12:44 PM [facility] "

## 2023-05-24 ENCOUNTER — Ambulatory Visit

## 2023-05-24 ENCOUNTER — Ambulatory Visit: Payer: PPO | Attending: Nurse Practitioner | Admitting: Nurse Practitioner

## 2023-05-24 ENCOUNTER — Encounter: Payer: Self-pay | Admitting: Nurse Practitioner

## 2023-05-24 VITALS — BP 108/62 | HR 54 | Ht 72.0 in | Wt 168.6 lb

## 2023-05-24 DIAGNOSIS — R55 Syncope and collapse: Secondary | ICD-10-CM

## 2023-05-24 DIAGNOSIS — I6523 Occlusion and stenosis of bilateral carotid arteries: Secondary | ICD-10-CM

## 2023-05-24 DIAGNOSIS — I5020 Unspecified systolic (congestive) heart failure: Secondary | ICD-10-CM

## 2023-05-24 DIAGNOSIS — I4729 Other ventricular tachycardia: Secondary | ICD-10-CM | POA: Diagnosis not present

## 2023-05-24 DIAGNOSIS — E785 Hyperlipidemia, unspecified: Secondary | ICD-10-CM

## 2023-05-24 DIAGNOSIS — I2510 Atherosclerotic heart disease of native coronary artery without angina pectoris: Secondary | ICD-10-CM | POA: Diagnosis not present

## 2023-05-24 DIAGNOSIS — F0280 Dementia in other diseases classified elsewhere without behavioral disturbance: Secondary | ICD-10-CM

## 2023-05-24 MED ORDER — METOPROLOL SUCCINATE ER 25 MG PO TB24: 12.5000 mg | ORAL_TABLET | Freq: Every day | ORAL | Status: AC

## 2023-05-24 NOTE — Progress Notes (Unsigned)
Enrolled patient for a 14 day Zio XT monitor to be mailed to patients home  Brendan Holland to read

## 2023-05-24 NOTE — Patient Instructions (Signed)
 Medication Instructions:  Your physician has recommended you make the following change in your medication:   1) STOP spironolactone  2) DECREASE metoprolol  succinate (Toprol  XL) to 12.5 mg daily  *If you need a refill on your cardiac medications before your next appointment, please call your pharmacy*  Testing/Procedures: Your physician has requested that you wear a Zio heart monitor for 14 days. This will be mailed to your home with instructions on how to apply the monitor and how to return it when finished. Please allow 2 weeks after returning the heart monitor before our office calls you with the results.   Follow-Up: At [facility], you and your health needs are our priority.  As part of our continuing mission to provide you with exceptional heart care, our providers are all part of one team.  This team includes your primary Cardiologist (physician) and Advanced Practice Providers or APPs (Physician Assistants and Nurse Practitioners) who all work together to provide you with the care you need, when you need it.  Your next appointment:   6 month(s)  The format for your next appointment:   In Person  Provider:   Arun K Thukkani, MD{  We recommend signing up for the patient portal called "MyChart".  Sign up information is provided on this After Visit Summary.  MyChart is used to connect with patients for Virtual Visits (Telemedicine).  Patients are able to view lab/test results, encounter notes, upcoming appointments, etc.  Non-urgent messages can be sent to your provider as well.   To learn more about what you can do with MyChart, go to [email or web address].   Other Instructions No driving for the next 6 months.   ZIO XT- Long Term Monitor Instructions     Your physician has requested you wear a ZIO patch monitor for 14 days.  This is a single patch monitor. [employer] supplies one patch monitor per enrollment. Additional  stickers are not available. Please do not apply  patch if you will be having a Nuclear Stress Test,  Echocardiogram, Cardiac CT, MRI, or Chest Xray during the period you would be wearing the  monitor. The patch cannot be worn during these tests. You cannot remove and re-apply the  ZIO XT patch monitor.  Your ZIO patch monitor will be mailed 3 day USPS to your address on file. It may take 3-5 days  to receive your monitor after you have been enrolled.  Once you have received your monitor, please review the enclosed instructions. Your monitor  has already been registered assigning a specific monitor serial # to you.     Billing and Patient Assistance Program Information     We have supplied [employer] with any of your insurance information on file for billing purposes.  [employer] offers a sliding scale Patient Assistance Program for patients that do not have  insurance, or whose insurance does not completely cover the cost of the ZIO monitor.  You must apply for the Patient Assistance Program to qualify for this discounted rate.  To apply, please call [employer] at [phone], select option 4, select option 2, ask to apply for  Patient Assistance Program. Sanna Crystal will ask your household income, and how many people  are in your household. They will quote your out-of-pocket cost based on that information.  [employer] will also be able to set up a 43-month, interest-free payment plan if needed.     Applying the monitor     Shave hair from upper left chest.  Hold abrader disc by  orange tab. Rub abrader in 40 strokes over the upper left chest as  indicated in your monitor instructions.  Clean area with 4 enclosed alcohol pads. Let dry.  Apply patch as indicated in monitor instructions. Patch will be placed under collarbone on left  side of chest with arrow pointing upward.  Rub patch adhesive wings for 2 minutes. Remove white label marked "1". Remove the white  label marked "2". Rub patch adhesive wings for 2 additional minutes.  While looking in a  mirror, press and release button in center of patch. A small green light will  flash 3-4 times. This will be your only indicator that the monitor has been turned on.  Do not shower for the first 24 hours. You may shower after the first 24 hours.  Press the button if you feel a symptom. You will hear a small click. Record Date, Time and  Symptom in the Patient Logbook.  When you are ready to remove the patch, follow instructions on the last 2 pages of Patient  Logbook. Stick patch monitor onto the last page of Patient Logbook.  Place Patient Logbook in the blue and white box. Use locking tab on box and tape box closed  securely. The blue and white box has prepaid postage on it. Please place it in the mailbox as  soon as possible. Your physician should have your test results approximately 7 days after the  monitor has been mailed back to [facility].  Call [facility] Customer Care at [phone] if you have questions regarding  your ZIO XT patch monitor. Call them immediately if you see an orange light blinking on your  monitor.  If your monitor falls off in less than 4 days, contact our Monitor department at [phone].  If your monitor becomes loose or falls off after 4 days call [employer] at [phone] for  suggestions on securing your monitor.

## 2023-05-25 ENCOUNTER — Encounter (HOSPITAL_COMMUNITY)
Admission: RE | Admit: 2023-05-25 | Discharge: 2023-05-25 | Disposition: A | Discharge status: Home / Self Care | Source: Ambulatory Visit | Attending: Internal Medicine

## 2023-05-25 DIAGNOSIS — Z955 Presence of coronary angioplasty implant and graft: Secondary | ICD-10-CM

## 2023-05-25 DIAGNOSIS — I2102 ST elevation (STEMI) myocardial infarction involving left anterior descending coronary artery: Secondary | ICD-10-CM

## 2023-05-28 ENCOUNTER — Encounter (HOSPITAL_COMMUNITY)
Admission: RE | Admit: 2023-05-28 | Discharge: 2023-05-28 | Disposition: A | Discharge status: Home / Self Care | Source: Ambulatory Visit | Attending: Internal Medicine | Admitting: Internal Medicine

## 2023-05-28 DIAGNOSIS — I2102 ST elevation (STEMI) myocardial infarction involving left anterior descending coronary artery: Secondary | ICD-10-CM

## 2023-05-28 DIAGNOSIS — Z955 Presence of coronary angioplasty implant and graft: Secondary | ICD-10-CM

## 2023-05-29 ENCOUNTER — Other Ambulatory Visit (INDEPENDENT_AMBULATORY_CARE_PROVIDER_SITE_OTHER): Payer: Self-pay

## 2023-05-29 ENCOUNTER — Ambulatory Visit: Admitting: Orthopaedic Surgery

## 2023-05-29 ENCOUNTER — Encounter: Payer: Self-pay | Admitting: Orthopaedic Surgery

## 2023-05-29 DIAGNOSIS — G8929 Other chronic pain: Secondary | ICD-10-CM | POA: Diagnosis not present

## 2023-05-29 DIAGNOSIS — M25561 Pain in right knee: Secondary | ICD-10-CM
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
## 2023-05-30 ENCOUNTER — Telehealth (HOSPITAL_COMMUNITY): Payer: Self-pay

## 2023-05-30 ENCOUNTER — Encounter (HOSPITAL_COMMUNITY): Admission: RE | Admit: 2023-05-30 | Source: Ambulatory Visit

## 2023-05-30 NOTE — Telephone Encounter (Signed)
 Patient c/o for 10:15am class due to wife's doc appt. Will be in for last class on Friday.

## 2023-06-01 ENCOUNTER — Encounter (HOSPITAL_COMMUNITY)
Admission: RE | Admit: 2023-06-01 | Discharge: 2023-06-01 | Discharge status: Home / Self Care | Source: Ambulatory Visit | Attending: Internal Medicine

## 2023-06-01 VITALS — BP 104/68 | HR 53 | Ht 72.0 in | Wt 168.4 lb

## 2023-06-01 DIAGNOSIS — I2102 ST elevation (STEMI) myocardial infarction involving left anterior descending coronary artery: Secondary | ICD-10-CM | POA: Diagnosis not present

## 2023-06-01 DIAGNOSIS — Z955 Presence of coronary angioplasty implant and graft: Secondary | ICD-10-CM

## 2023-06-01 NOTE — Progress Notes (Signed)
 Discharge Progress Report  Patient Details  Name: Brendan Holland MRN: [id number] Date of Birth: [birth year] Referring Provider:   Flowsheet Row INTENSIVE CARDIAC REHAB ORIENT from 02/23/2023 in [facility]  Referring Provider Alyssa Backbone, MD        Number of Visits: 72  Reason for Discharge:  Patient reached a stable level of exercise. Patient independent in their exercise. Patient has met program and personal goals.  Smoking History:  Social History   Tobacco Use  Smoking Status Former   Current packs/day: 0.00   Types: Cigarettes   Quit date: 12/20/1982   Years since quitting: 40.5  Smokeless Tobacco Never    Diagnosis:  02/03/23 STEMI  02/03/23 DES LAD  ADL UCSD:   Initial Exercise Prescription:  Initial Exercise Prescription - 02/23/23 0900       Date of Initial Exercise RX and Referring Provider   Date 02/23/23    Referring Provider Alyssa Backbone, MD    Expected Discharge Date 05/16/23      Treadmill   MPH 2    Grade 0    Minutes 15    METs 2.5      NuStep   Level 2    SPM 60    Minutes 15    METs 2.5      Prescription Details   Frequency (times per week) 3    Duration Progress to 30 minutes of continuous aerobic without signs/symptoms of physical distress      Intensity   THRR 40-80% of Max Heartrate 58-117    Ratings of Perceived Exertion 11-13    Perceived Dyspnea 0-4      Progression   Progression Continue progressive overload as per policy without signs/symptoms or physical distress.      Resistance Training   Training Prescription Yes    Weight 4    Reps 10-15             Discharge Exercise Prescription (Final Exercise Prescription Changes):  Exercise Prescription Changes - 06/01/23 1021       Response to Exercise   Blood Pressure (Admit) 104/68    Blood Pressure (Exit) 100/62    Heart Rate (Admit) 53 bpm    Heart Rate (Exercise) 107 bpm    Heart Rate (Exit)  71 bpm    Rating of Perceived Exertion (Exercise) 11    Symptoms None    Comments Brendan Holland completed the cardiac rehab program today.    Duration Continue with 30 min of aerobic exercise without signs/symptoms of physical distress.    Intensity THRR unchanged      Progression   Progression Continue to progress workloads to maintain intensity without signs/symptoms of physical distress.    Average METs 5.2      Resistance Training   Training Prescription Yes    Weight 6 lbs    Reps 10-15    Time 10 Minutes      Interval Training   Interval Training No      Treadmill   MPH 3.7    Grade 3.5    Minutes 15    METs 5.62      NuStep   Level 6    SPM 128    Minutes 15    METs 4.7      Home Exercise Plan   Plans to continue exercise at Home (comment)   Walking   Frequency Add 3 additional days to program exercise sessions.  Initial Home Exercises Provided 03/23/23             Functional Capacity:  6 Minute Walk     Row Name 02/23/23 0958 05/28/23 1044       6 Minute Walk   Phase Initial Discharge    Distance 1200 feet 1965 feet    Distance % Change -- 63.75 %    Distance Feet Change -- 765 ft    Walk Time 6 minutes 6 minutes    # of Rest Breaks 0 0    MPH 2.27 3.72    METS 2.67 4.27    RPE 10 11    Perceived Dyspnea  0 0    VO2 Peak 9.35 14.96    Symptoms No No    Resting HR 64 bpm 51 bpm    Resting BP 122/64 124/70    Resting Oxygen Saturation  98 % --    Exercise Oxygen Saturation  during 6 min walk 100 % 99 %    Max Ex. HR 75 bpm 83 bpm    Max Ex. BP 138/72 168/72    2 Minute Post BP 118/68 132/68             Psychological, QOL, Others - Outcomes: PHQ 2/9:    06/01/2023   10:09 AM 02/23/2023    8:41 AM  Depression screen PHQ 2/9  Decreased Interest 1 1  Down, Depressed, Hopeless 1 1  PHQ - 2 Score 2 2  Altered sleeping 1 0  Tired, decreased energy 2 1  Change in appetite 0 0  Feeling bad or failure about yourself  0 0  Trouble  concentrating 0 0  Moving slowly or fidgety/restless 0 0  Suicidal thoughts 0 0  PHQ-9 Score 5 3  Difficult doing work/chores Not difficult at all Not difficult at all    Quality of Life:  Quality of Life - 06/01/23 1440       Quality of Life   Select Quality of Life      Quality of Life Scores   Health/Function Pre 27.47 %    Health/Function Post 27.75 %    Health/Function % Change 1.02 %    Socioeconomic Pre 28 %    Socioeconomic Post 29.06 %    Socioeconomic % Change  3.79 %    Psych/Spiritual Pre 30 %    Psych/Spiritual Post 30 %    Psych/Spiritual % Change 0 %    Family Pre 28.8 %    Family Post 27.6 %    Family % Change -4.17 %    GLOBAL Pre 28.25 %    GLOBAL Post 28.5 %    GLOBAL % Change 0.88 %             Personal Goals: Goals established at orientation with interventions provided to work toward goal.  Personal Goals and Risk Factors at Admission - 02/23/23 0845       Core Components/Risk Factors/Patient Goals on Admission    Weight Management Yes;Weight Maintenance    Intervention Weight Management: Develop a combined nutrition and exercise program designed to reach desired caloric intake, while maintaining appropriate intake of nutrient and fiber, sodium and fats, and appropriate energy expenditure required for the weight goal.;Weight Management: Provide education and appropriate resources to help participant work on and attain dietary goals.    Expected Outcomes Short Term: Continue to assess and modify interventions until short term weight is achieved;Long Term: Adherence to nutrition and physical activity/exercise program aimed  toward attainment of established weight goal;Weight Maintenance: Understanding of the daily nutrition guidelines, which includes 25-35% calories from fat, 7% or less cal from saturated fats, less than 200mg  cholesterol, less than 1.5gm of sodium, & 5 or more servings of fruits and vegetables daily;Understanding recommendations for  meals to include 15-35% energy as protein, 25-35% energy from fat, 35-60% energy from carbohydrates, less than 200mg  of dietary cholesterol, 20-35 gm of total fiber daily;Understanding of distribution of calorie intake throughout the day with the consumption of 4-5 meals/snacks    Heart Failure Yes    Intervention Provide a combined exercise and nutrition program that is supplemented with education, support and counseling about heart failure. Directed toward relieving symptoms such as shortness of breath, decreased exercise tolerance, and extremity edema.    Expected Outcomes Improve functional capacity of life;Short term: Attendance in program 2-3 days a week with increased exercise capacity. Reported lower sodium intake. Reported increased fruit and vegetable intake. Reports medication compliance.;Short term: Daily weights obtained and reported for increase. Utilizing diuretic protocols set by physician.;Long term: Adoption of self-care skills and reduction of barriers for early signs and symptoms recognition and intervention leading to self-care maintenance.    Hypertension Yes    Intervention Provide education on lifestyle modifcations including regular physical activity/exercise, weight management, moderate sodium restriction and increased consumption of fresh fruit, vegetables, and low fat dairy, alcohol moderation, and smoking cessation.;Monitor prescription use compliance.    Expected Outcomes Short Term: Continued assessment and intervention until BP is < 140/109mm HG in hypertensive participants. < 130/12mm HG in hypertensive participants with diabetes, heart failure or chronic kidney disease.;Long Term: Maintenance of blood pressure at goal levels.    Lipids Yes    Intervention Provide education and support for participant on nutrition & aerobic/resistive exercise along with prescribed medications to achieve LDL 70mg , HDL >40mg .    Expected Outcomes Short Term: Participant states understanding of  desired cholesterol values and is compliant with medications prescribed. Participant is following exercise prescription and nutrition guidelines.;Long Term: Cholesterol controlled with medications as prescribed, with individualized exercise RX and with personalized nutrition plan. Value goals: LDL < 70mg , HDL > 40 mg.              Personal Goals Discharge:  Goals and Risk Factor Review     Row Name 02/28/23 1440 03/16/23 1531 04/17/23 0738 05/04/23 0938 06/01/23 1450     Core Components/Risk Factors/Patient Goals Review   Personal Goals Review Weight Management/Obesity;Heart Failure;Hypertension;Lipids Weight Management/Obesity;Heart Failure;Hypertension;Lipids Weight Management/Obesity;Heart Failure;Hypertension;Lipids Weight Management/Obesity;Heart Failure;Hypertension;Lipids Weight Management/Obesity;Heart Failure;Hypertension;Lipids   Review Brendan Holland started cardiac rehab on 02/28/23. Brendan Holland did well with exercise. Vital signs were stable. Consuelo Denmark is doing  well with exercise at cardiac rehab. Vital signs have been stable. Consuelo Denmark has been increasing his workloads without difficulty. Consuelo Denmark continues to do  well with exercise at cardiac rehab. Vital signs have been stable. Consuelo Denmark has been increasing his workloads without difficulty. Consuelo Denmark continues to do  well with exercise at cardiac rehab. Vital signs remain been stable. Consuelo Denmark has been increasing his workloads without difficulty. Consuelo Denmark has lost 1.8 kg since starting cardiac rehab Brendan Holland did well with exercise at cardiac rehab. Vital signs remain were stable. Brendan Holland completed cardiac rehab on 06/01/23. Consuelo Denmark has lost 1.2 kg since starting cardiac rehab   Expected Outcomes Brendan Holland Will continue to participate in cardiac rehab for exercise, nutrtion and lifestyle modifications. Consuelo Denmark Will continue to participate in cardiac rehab for exercise, nutrtion and lifestyle modifications. Consuelo Denmark Will continue to participate  in cardiac rehab for exercise, nutrtion and  lifestyle modifications. Consuelo Denmark Will continue to participate in cardiac rehab for exercise, nutrtion and lifestyle modifications. Consuelo Denmark Will continue to exercise, follow nutrtion and lifestyle modifications upon completion of cardiac rehab.            Exercise Goals and Review:  Exercise Goals     Row Name 02/23/23 0841             Exercise Goals   Increase Physical Activity Yes       Intervention Provide advice, education, support and counseling about physical activity/exercise needs.;Develop an individualized exercise prescription for aerobic and resistive training based on initial evaluation findings, risk stratification, comorbidities and participant's personal goals.       Expected Outcomes Short Term: Attend rehab on a regular basis to increase amount of physical activity.;Long Term: Exercising regularly at least 3-5 days a week.;Long Term: Add in home exercise to make exercise part of routine and to increase amount of physical activity.       Increase Strength and Stamina Yes       Intervention Provide advice, education, support and counseling about physical activity/exercise needs.;Develop an individualized exercise prescription for aerobic and resistive training based on initial evaluation findings, risk stratification, comorbidities and participant's personal goals.       Expected Outcomes Short Term: Increase workloads from initial exercise prescription for resistance, speed, and METs.;Short Term: Perform resistance training exercises routinely during rehab and add in resistance training at home;Long Term: Improve cardiorespiratory fitness, muscular endurance and strength as measured by increased METs and functional capacity ( )       Able to understand and use rate of perceived exertion (RPE) scale Yes       Intervention Provide education and explanation on how to use RPE scale       Expected Outcomes Short Term: Able to use RPE daily in rehab to express subjective intensity  level;Long Term:  Able to use RPE to guide intensity level when exercising independently       Knowledge and understanding of Target Heart Rate Range (THRR) Yes       Intervention Provide education and explanation of THRR including how the numbers were predicted and where they are located for reference       Expected Outcomes Short Term: Able to state/look up THRR;Short Term: Able to use daily as guideline for intensity in rehab;Long Term: Able to use THRR to govern intensity when exercising independently       Understanding of Exercise Prescription Yes       Intervention Provide education, explanation, and written materials on patient's individual exercise prescription       Expected Outcomes Short Term: Able to explain program exercise prescription;Long Term: Able to explain home exercise prescription to exercise independently                Exercise Goals Re-Evaluation:  Exercise Goals Re-Evaluation     Row Name 02/28/23 1205 03/19/23 1059 03/23/23 1035 04/18/23 1537 05/23/23 1027     Exercise Goal Re-Evaluation   Exercise Goals Review Increase Physical Activity;Understanding of Exercise Prescription;Increase Strength and Stamina;Knowledge and understanding of Target Heart Rate Range (THRR);Able to understand and use rate of perceived exertion (RPE) scale Increase Physical Activity;Understanding of Exercise Prescription;Increase Strength and Stamina;Knowledge and understanding of Target Heart Rate Range (THRR);Able to understand and use rate of perceived exertion (RPE) scale Increase Physical Activity;Understanding of Exercise Prescription;Increase Strength and Stamina;Knowledge and understanding of Target Heart Rate Range (THRR);Able  to understand and use rate of perceived exertion (RPE) scale Increase Physical Activity;Understanding of Exercise Prescription;Increase Strength and Stamina;Knowledge and understanding of Target Heart Rate Range (THRR);Able to understand and use rate of perceived  exertion (RPE) scale Increase Physical Activity;Understanding of Exercise Prescription;Increase Strength and Stamina;Knowledge and understanding of Target Heart Rate Range (THRR);Able to understand and use rate of perceived exertion (RPE) scale   Comments Pt's first day in the [facility] program. Pt understands the exercise Rx, RPE scale and THRR. Consuelo Denmark continues to increase workloads appropriately at cardiac rehab. He is walking at home 20 minutes 4 days/week. His goal is to increase his endurance and stamina. He would like to resume body weight exercises, such as leg lifts and sit-ups. I instructed him to check with his cardiologist to see if he has any lifting restrictions before resuming body weight exercises, and he is agreeable. Reviewed exercise prescription with Brendan Holland. He received clearance to return to low to moderate intensity resistance training up to 15 lb dumb bells and leg lifts from Charles Connor, Marieta Shorten, NP. His goal is to get back to running and resume his usual routine. He has a meeting at [facility] to check out the facility and may exercise there as well. Consuelo Denmark continues to progess well with exercise. He plans to join [employer] and in the meantime continue home exercise routine. He discussed his returning to his previous weight training routine with his cardiologist as was cleared to resume starting lighter and progressing from there. He is doing sit-ups, push ups and leg lifts as part of his home exercise routine. Brendan Holland returned to exercise this week after being out with a fall and family situation. He tolerated exercise well, without issues. He will extend his cardiac rehab sessions to make up missed days. He plans to continue exercise at [facility] upon completion of the CR program.   Expected Outcomes Will continue to monitor the patient and increase exercise workloads as tolerated. Continue to progress workloads as tolerated to help increase endurance and stamina. Continue daily  exercise at low to moderate intensity. Progress workloads gradually as tolerated. Consuelo Denmark will resume his weight training routine and join local fitness center. Continue current exercise routine.    Row Name 05/28/23 1057 06/01/23 1133           Exercise Goal Re-Evaluation   Exercise Goals Review Increase Physical Activity;Understanding of Exercise Prescription;Increase Strength and Stamina;Knowledge and understanding of Target Heart Rate Range (THRR);Able to understand and use rate of perceived exertion (RPE) scale Increase Physical Activity;Understanding of Exercise Prescription;Increase Strength and Stamina;Knowledge and understanding of Target Heart Rate Range (THRR);Able to understand and use rate of perceived exertion (RPE) scale      Comments Consuelo Denmark will complete the cardiac rehab program this week and has progressed well. His functional capacity increased 64% as measured by . He is almost back to his his pre heart attack exercise routine. He's walking, stretching, and using his free weights and exercise bands at home without issues. Brendan Holland completed the cardiac rehab program today and made excellent progress, achieving 5.2 METs with exercise. His functional capacity increased 64% as measured by . He walked two 5K's ~3.1 miles and wants to get back to walking 10K and greater ~7-9 miles. He will also workout at [facility].      Expected Outcomes Consuelo Denmark will continue his daily home exercise routine upon completion of the cardiace rehab program. Consuelo Denmark will continue daily exercise to maintain health and fitness gains.  Nutrition & Weight - Outcomes:  Pre Biometrics - 02/23/23 0835       Pre Biometrics   Waist Circumference 34 inches    Hip Circumference 37 inches    Waist to Hip Ratio 0.92 %    Triceps Skinfold 10 mm    % Body Fat 42 %    Grip Strength 42 kg    Flexibility 15 in    Single Leg Stand 17.06 seconds             Post Biometrics - 06/01/23 1023         Post  Biometrics   Height 6' (1.829 m)    Waist Circumference 34 inches    Hip Circumference 38.5 inches    Waist to Hip Ratio 0.88 %    Triceps Skinfold 6 mm    % Body Fat 19.2 %    Grip Strength 41 kg    Flexibility 16.13 in    Single Leg Stand 30 seconds             Nutrition:  Nutrition Therapy & Goals - 06/01/23 0957       Nutrition Therapy   Diet heart healthy diet    Drug/Food Interactions Statins/Certain Fruits      Personal Nutrition Goals   Nutrition Goal Patient to identify strategies for reducing cardiovascular risk by attending the [facility] education and nutrition series weekly.   goal not met.   Personal Goal #2 Patient to improve diet quality by using the plate method as a guide for meal planning to include lean protein/plant protein, fruits, vegetables, whole grains, nonfat dairy as part of a well-balanced diet.   goal in progress.   Comments Goals in progress. Geronimo has medical history of STEMI, HTN, hyperlipidemia, dementia. Per documenatation, he will begin monitoring blood pressure at home due to recent history of low blood pressure. Lipids WNL, LDL has improved to 72. He is down 2.6# since starting with our program. He does not regulalrly attend the [facility] education/nutrition series.Patient will benefit from adherence to nutrition, exercise, and lifestyle modifications.      Intervention Plan   Intervention Prescribe, educate and counsel regarding individualized specific dietary modifications aiming towards targeted core components such as weight, hypertension, lipid management, diabetes, heart failure and other comorbidities.;Nutrition handout(s) given to patient.    Expected Outcomes Short Term Goal: Understand basic principles of dietary content, such as calories, fat, sodium, cholesterol and nutrients.;Long Term Goal: Adherence to prescribed nutrition plan.             Nutrition Discharge:  Nutrition Assessments - 06/04/23 1002        Rate Your Plate Scores   Post Score 66             Education Questionnaire Score:  Knowledge Questionnaire Score - 06/01/23 1008       Knowledge Questionnaire Score   Pre Score 22/24    Post Score 23/24             Goals reviewed with patient; copy given to patient.Pt graduates from  Intensive/Traditional cardiac rehab program on 06/01/23 with completion of  34 exercise and  8 education sessions. Pt maintained good attendance and progressed nicely during his participation in rehab as evidenced by increased MET level. Brendan Holland increased his distance on his post exercise walk test by 765 feet.   Medication list reconciled. Repeat  PHQ score- 5 .  Pt has made significant lifestyle changes and should be commended for his success.  Brendan Holland  achieved their goals during cardiac rehab.   Pt plans to continue exercise by running, using weights, bands and stretching. Consuelo Denmark says that participating in cardiac rehab has been very helpful. We are proud of Brendan Holland's progress. [place] RN BSN

## 2023-06-16 ENCOUNTER — Ambulatory Visit: Payer: Self-pay | Admitting: Nurse Practitioner

## 2023-06-16 DIAGNOSIS — R55 Syncope and collapse: Secondary | ICD-10-CM

## 2023-06-18 NOTE — Telephone Encounter (Signed)
**Note De-identified  [place] Obfuscation** Please advise 

## 2023-07-19 ENCOUNTER — Telehealth: Payer: Self-pay | Admitting: Pharmacy Technician

## 2023-07-19 NOTE — Telephone Encounter (Signed)
brilinta

## 2023-08-24 ENCOUNTER — Other Ambulatory Visit (HOSPITAL_COMMUNITY): Payer: Self-pay | Admitting: Adult Health

## 2023-09-14 ENCOUNTER — Telehealth: Payer: Self-pay | Admitting: Pharmacy Technician

## 2023-09-14 MED ORDER — TICAGRELOR 90 MG PO TABS: 90.0000 mg | ORAL_TABLET | Freq: Two times a day (BID) | ORAL | 6 refills | Status: DC

## 2023-09-14 NOTE — Telephone Encounter (Signed)
 Received notification that Brilinta  needs a refill from [place] [facility]: Is in media

## 2023-09-14 NOTE — Telephone Encounter (Signed)
 Sent in refills

## 2023-09-14 NOTE — Addendum Note (Signed)
 Addended by: DRENA MARTINIS, Bandy Honaker L on: 09/14/2023 05:29 PM   Modules accepted: Orders

## 2023-09-19 ENCOUNTER — Other Ambulatory Visit: Payer: Self-pay | Admitting: Nurse Practitioner

## 2023-09-20 NOTE — Telephone Encounter (Signed)
 Hi, they are still saying they need this refill. Looks like 09/14/23 went to [facility] and then yesterday a refill was refused?

## 2023-09-21 NOTE — Telephone Encounter (Signed)
 Please send 30 day then needs refills from [facility]. We are not following him in the [facility]. Please let him know.   Thank you.   Kiyla Ringler NP-C

## 2023-09-24 MED ORDER — TICAGRELOR 90 MG PO TABS: 90.0000 mg | ORAL_TABLET | Freq: Two times a day (BID) | ORAL | 6 refills | Status: AC

## 2023-09-24 NOTE — Telephone Encounter (Signed)
 Brillinta filled under Dr. Wendel.

## 2023-09-24 NOTE — Addendum Note (Signed)
 Addended by: JANIT GENI CROME on: 09/24/2023 09:18 AM   Modules accepted: Orders

## 2023-11-06 NOTE — Progress Notes (Deleted)
 Cardiology Office Note:  .   Date:  11/06/2023  ID:  Brendan Holland, DOB 06-May-1950, MRN [id number] PCP: Leonce Sink, MD  [facility] Providers Cardiologist:  Arun K Thukkani, MD { Click to update primary MD,subspecialty MD or APP then REFRESH:1}   History of Present Illness: .   Brendan Holland is a 73 y.o. male  with a PMH of CAD s/p anterior STEMI with cath showing occluded mid LAD, 90% stenosis OM1 branch, 70% dRCA.  DES to LAD.  Returned to the lab for PCI DES OM and RCA  HLD, early onset Alzheimer's, bilateral carotid stenosis (1-39%) HFrEF, ICM, VT s/p DCCV x 2, OSA DVT.He underwent a repeat 2D echo on 04/2023 that showed improved EF of 55 to 60%.  Last seen 05/2023 and was orthostatic hypotension and syncope. Spiro stopped and toprol  reduced 12.5 mg daily.     ROS: ***  Studies Reviewed: SABRA         Prior CV Studies: {Select studies to display:26339}  ***  Risk Assessment/Calculations:   {Does this patient have ATRIAL FIBRILLATION?[phone]} No BP recorded.  {Refresh Note OR Click here to enter BP  :1}***       Physical Exam:   VS:  There were no vitals taken for this visit.   Orhtostatics: No data found. Wt Readings from Last 3 Encounters:  06/01/23 168 lb 6.9 oz (76.4 kg)  05/24/23 168 lb 9.6 oz (76.5 kg)  02/23/23 171 lb 1.2 oz (77.6 kg)    GEN: Well nourished, well developed in no acute distress NECK: No JVD; No carotid bruits CARDIAC: ***RRR, no murmurs, rubs, gallops RESPIRATORY:  Clear to auscultation without rales, wheezing or rhonchi  ABDOMEN: Soft, non-tender, non-distended EXTREMITIES:  No edema; No deformity   ASSESSMENT AND PLAN: .     Coronary artery disease: -s/p anterior STEMI with cath showing occluded mid LAD, 90% stenosis OM1 branch, 70% dRCA.  DES to LAD.  Returned to the lab for PCI DES OM and RCA  -Today patient reports no chest pain or shortness of breath since his PCI. -He is fine to proceed with cardiac rehab. -He does  report some possible cost prohibition with Brilinta  and was provided patient assistance paperwork -Continue current GDMT with ASA 81 mg, Toprol  25 mg daily, Brilinta  90 mg twice daily, Lipitor 80 mg daily   2.  HFpEF/ICM:  -Heart function improved from 30% to 65% following most recent 2D echo. -Today patient is euvolemic on examination - Discontinue spironolactone  25 mg daily due to orthostasis, and reduce Toprol -XL 12.5 mg - Continue Jardiance  10 mg daily   3.  NSVT/junctional rhythm: -Patient had VT during initial PCI requiring DCCV x 2 with bouts of intermittent junctional rhythm that resolved with amiodarone . - Patient reports episode of syncope on May 2nd, possibly due to orthostatic hypotension or arrhythmia. CT scan normal.  -Discontinuing spironolactone  may reduce hypotension risk. Heart monitor to evaluate arrhythmias. - Order 14-day heart monitor to evaluate for arrhythmias. - Discontinue spironolactone  and reduce Toprol -XL to 12.5 mg to reduce risk of hypotension.   4.  Orthostatic hypotension Potential contributor to syncope. Blood pressure may be too low due to medications.  - Orthostatic BPs completed and were positive from sitting to standing - We will discontinue spironolactone  25 mg and reduced Toprol -XL to 12.5 mg - Per [place]  DMV patient should abstain from driving for least 6 months until determination is made regarding cause of syncopal episode.   Hyperlipidemia: -Patient's last  LDL cholesterol was 72 -Continue atorvastatin  80 mg daily   6. Carotid artery stenosis: Carotid doppler showed mild narrowing. Current medications are optimal treatment. - Continue current medications including aspirin  and cholesterol medication.       {Are you ordering a CV Procedure (e.g. stress test, cath, DCCV, TEE, etc)?   Press F2        :[id number]}  Dispo: ***  Signed, Olivia Pavy, PA-C

## 2023-11-19 ENCOUNTER — Encounter: Payer: Self-pay | Admitting: Radiology

## 2023-11-20 ENCOUNTER — Ambulatory Visit: Attending: Nurse Practitioner | Admitting: Nurse Practitioner

## 2023-11-20 ENCOUNTER — Ambulatory Visit: Admitting: Physician Assistant

## 2023-11-20 ENCOUNTER — Encounter: Payer: Self-pay | Admitting: Nurse Practitioner

## 2023-11-20 VITALS — BP 120/68 | HR 59 | Ht 72.0 in | Wt 169.8 lb

## 2023-11-20 DIAGNOSIS — I4729 Other ventricular tachycardia: Secondary | ICD-10-CM | POA: Diagnosis not present

## 2023-11-20 DIAGNOSIS — I498 Other specified cardiac arrhythmias: Secondary | ICD-10-CM | POA: Diagnosis not present

## 2023-11-20 DIAGNOSIS — I2510 Atherosclerotic heart disease of native coronary artery without angina pectoris: Secondary | ICD-10-CM

## 2023-11-20 DIAGNOSIS — I6523 Occlusion and stenosis of bilateral carotid arteries: Secondary | ICD-10-CM

## 2023-11-20 DIAGNOSIS — G4733 Obstructive sleep apnea (adult) (pediatric): Secondary | ICD-10-CM

## 2023-11-20 DIAGNOSIS — I10 Essential (primary) hypertension: Secondary | ICD-10-CM

## 2023-11-20 DIAGNOSIS — I951 Orthostatic hypotension: Secondary | ICD-10-CM

## 2023-11-20 DIAGNOSIS — I255 Ischemic cardiomyopathy: Secondary | ICD-10-CM

## 2023-11-20 DIAGNOSIS — I471 Supraventricular tachycardia, unspecified: Secondary | ICD-10-CM

## 2023-11-20 DIAGNOSIS — E785 Hyperlipidemia, unspecified: Secondary | ICD-10-CM

## 2023-11-20 NOTE — Patient Instructions (Signed)
 Medication Instructions:  Your physician recommends that you continue on your current medications as directed. Please refer to the Current Medication list given to you today.  *If you need a refill on your cardiac medications before your next appointment, please call your pharmacy*  Lab Work: NONE ordered at this time of appointment   Testing/Procedures: NONE ordered at this time of appointment   Follow-Up: At [facility], you and your health needs are our priority.  As part of our continuing mission to provide you with exceptional heart care, our providers are all part of one team.  This team includes your primary Cardiologist (physician) and Advanced Practice Providers or APPs (Physician Assistants and Nurse Practitioners) who all work together to provide you with the care you need, when you need it.  Your next appointment:    January or February appointment   Provider:   Arun K Thukkani, MD    We recommend signing up for the patient portal called MyChart.  Sign up information is provided on this After Visit Summary.  MyChart is used to connect with patients for Virtual Visits (Telemedicine).  Patients are able to view lab/test results, encounter notes, upcoming appointments, etc.  Non-urgent messages can be sent to your provider as well.   To learn more about what you can do with MyChart, go to [email or web address].
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
## 2023-11-21 ENCOUNTER — Other Ambulatory Visit (HOSPITAL_COMMUNITY): Payer: Self-pay | Admitting: Cardiology

## 2023-11-23 ENCOUNTER — Encounter: Payer: Self-pay | Admitting: Nurse Practitioner

## 2024-01-17 NOTE — Progress Notes (Signed)
 "  Cardiology Office Note:   Date:  01/25/2024  ID:  [name], DOB 11-16-1950, MRN [id number] PCP:  Leonce Sink, MD  [facility] Providers Cardiologist:  Wendel Haws, MD Referring MD: Leonce Sink, MD  Chief Complaint/Reason for Referral: Follow-up ACS ASSESSMENT:    1. Acute coronary syndrome (HCC)   2. Ischemic cardiomyopathy   3. Hyperlipidemia LDL goal <55   4. Aortic atherosclerosis   5. PSVT (paroxysmal supraventricular tachycardia)   6. Carotid stenosis, bilateral   7. CKD (chronic kidney disease) stage 2, GFR 60-89 ml/min   8. Early onset Alzheimer's dementia, unspecified dementia severity, unspecified whether behavioral, psychotic, or mood disturbance or anxiety (HCC)   9. Other fatigue     PLAN:   In order of problems listed above: Acute coronary syndrome: Discontinue aspirin  81 mg, continue ticagrelor  90 mg twice daily indefinitely.  Ischemic cardiomyopathy: EF normalized.  Continue Toprol  25 mg, Jardiance  10 mg, start losartan  12.5 mg at bedtime.  Consider low dose spironolactone  at bedtime in future. Hyperlipidemia: Continue atorvastatin  80 mg.  Check lipid panel, LFTs next week Aortic atherosclerosis: Continue ticagrelor  90 mg twice daily, atorvastatin  80 mg SVT: Continue Toprol  25 mg Carotid stenosis: Continue ticagrelor  90 twice daily, atorvastatin  80 mg CKD stage II: Continue Jardiance  10 mg, start losartan  12.5 mg; check labs next week Early onset Alzheimer's: Continue Aricept  10 mg Fatigue: May be due to beta-blocker.  His heart rate is rather low.  He is an avid runner though.  I have asked the patient to hold his beta-blocker for a week and see if this helps.  He will let us  know at the end of this holiday.  If this does seem to help his fatigue we may decrease his metoprolol  dose..  Check CBC, reflex TSH, CMP next week            Dispo:  Return in about 6 months (around 07/24/2024).       I spent 38 minutes reviewing all clinical data during  and prior to this visit including all relevant imaging studies, laboratories, clinical information from other health systems and prior notes from both Cardiology and other specialties, interviewing the patient, conducting a complete physical examination, and coordinating care in order to formulate a comprehensive and personalized evaluation and treatment plan.   History of Present Illness:    FOCUSED PROBLEM LIST:   ACS January 2025 Mid LAD PCI DES x 2 complicated by ventricular tachycardia Staged PCI OM1 and distal right coronary artery Complicated by air embolism >> atropine  + phenylephrine  Ischemic cardiomyopathy Anteroseptal wall motion abnormality, EF 30 to 35% TTE January 2025 Normal diastolic function, no significant valvular abnormalities, EF 55 to 60% TTE April 2025 Hyperlipidemia LP(a) 10.4 Aortic atherosclerosis Chest x-ray 2025 SVT SVT, rare PACs, rare PVCs, no AF, VT, bradycardia monitor May 2025 Carotid disease 1 to 39% bilateral carotid disease ultrasound 2025 CKD stage II Early onset Alzheimer's disease BMI 07 February 2024:  Patient consents to use of AI scribe. The patient returns for routine follow-up.  He was last seen by cardiology in May of this year.  Due to orthostatic symptoms and his spironolactone  was discontinued and Toprol  was reduced to 12.5 mg.  He was referred for monitor.  Monitor demonstrated occasional asymptomatic SVT.  His Toprol  was increased to 25 mg.    He has been experiencing fatigue and a lack of energy compared to his previous levels. This fatigue is relatively new, although he had similar complaints two  years prior to his heart attack.  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 Despite this activity, he feels less energetic and is not engaging in household chores as he used to.  He is currently taking Brilinta  and Toprol , with Toprol  being taken at  night. He mentions that he was previously on losartan , a blood pressure medication, but is unsure of its current status in his regimen.  His social history includes significant caregiving responsibilities for his wife, whose health has declined. He assists her with daily activities, meals, shopping, and transportation to appointments, which has increased his time spent at home     Current Medications: Active Medications[1]   Review of Systems:   Please see the history of present illness.    All other systems reviewed and are negative.     EKGs/Labs/Other Test Reviewed:   EKG: November 2025 sinus bradycardia and first-degree AV block  EKG Interpretation Date/Time:    Ventricular Rate:    PR Interval:    QRS Duration:    QT Interval:    QTC Calculation:   R Axis:      Text Interpretation:          CARDIAC STUDIES: Refer to CV Procedures and Imaging Tabs   Risk Assessment/Calculations:          Physical Exam:   VS:  BP 108/70   Pulse (!) 54   Ht 6' (1.829 m)   Wt 173 lb 9.6 oz (78.7 kg)   SpO2 96%   BMI 23.54 kg/m        Wt Readings from Last 3 Encounters:  01/25/24 173 lb 9.6 oz (78.7 kg)  11/20/23 169 lb 12.8 oz (77 kg)  06/01/23 168 lb 6.9 oz (76.4 kg)      GENERAL:  No apparent distress, AOx3 HEENT:  No carotid bruits, +2 carotid impulses, no scleral icterus CAR: RRR no murmurs, gallops, rubs, or thrills RES:  Clear to auscultation bilaterally ABD:  Soft, nontender, nondistended, positive bowel sounds x 4 VASC:  +2 radial pulses, +2 carotid pulses NEURO:  CN 2-12 grossly intact; motor and sensory grossly intact PSYCH:  No active depression or anxiety EXT:  No edema, ecchymosis, or cyanosis  Signed, Alfio Loescher K Marwan Lipe, MD  01/25/2024 9:06 AM    [facility] [address] [place], [place], [place]  [address] Phone: [phone]; Fax: [phone]   Note:  This document was prepared using Dragon voice recognition software and may include  unintentional dictation errors.     [1]  Current Meds  Medication Sig   aspirin  EC 81 MG tablet Take 81 mg by mouth at bedtime.   atorvastatin  (LIPITOR) 80 MG tablet TAKE 1 TABLET BY MOUTH EVERY DAY   donepezil  (ARICEPT ) 10 MG tablet TAKE 1 TABLET BY MOUTH DAILY (Patient taking differently: Take 10 mg by mouth at bedtime.)   empagliflozin  (JARDIANCE ) 10 MG TABS tablet Take 1 tablet (10 mg total) by mouth daily before breakfast.   GLUCOSAMINE CHONDROITIN COMPLX PO Take 1 tablet by mouth at bedtime.   losartan  (COZAAR ) 25 MG tablet Take 0.5 tablets (12.5 mg total) by mouth at bedtime.   metoprolol  succinate (TOPROL -XL) 25 MG 24 hr tablet Take 0.5 tablets (12.5 mg total) by mouth daily.   Multiple Vitamins-Minerals (MULTIVITAMIN WITH MINERALS) tablet Take 1 tablet by mouth at bedtime.   Omega-3 Fatty Acids (FISH OIL  PO) Take 1 capsule by mouth at bedtime.   pantoprazole  (PROTONIX ) 40 MG tablet Take 1 tablet (40 mg total) by mouth daily.   ticagrelor  (BRILINTA ) 90 MG TABS tablet Take 1 tablet (90 mg total) by mouth 2 (two) times daily.   traZODone (DESYREL) 50 MG tablet Take 50 mg by mouth at bedtime. (Patient taking differently: Take 50 mg by mouth as needed for sleep.)   "

## 2024-01-25 ENCOUNTER — Encounter: Payer: Self-pay | Admitting: Internal Medicine

## 2024-01-25 ENCOUNTER — Ambulatory Visit: Attending: Internal Medicine | Admitting: Internal Medicine

## 2024-01-25 VITALS — BP 108/70 | HR 54 | Ht 72.0 in | Wt 173.6 lb

## 2024-01-25 DIAGNOSIS — I700 Atherosclerosis of aorta: Secondary | ICD-10-CM | POA: Diagnosis not present

## 2024-01-25 DIAGNOSIS — F0280 Dementia in other diseases classified elsewhere without behavioral disturbance: Secondary | ICD-10-CM

## 2024-01-25 DIAGNOSIS — G300 Alzheimer's disease with early onset: Secondary | ICD-10-CM | POA: Diagnosis not present

## 2024-01-25 DIAGNOSIS — R5383 Other fatigue: Secondary | ICD-10-CM | POA: Diagnosis not present

## 2024-01-25 DIAGNOSIS — I255 Ischemic cardiomyopathy: Secondary | ICD-10-CM | POA: Diagnosis not present

## 2024-01-25 DIAGNOSIS — E785 Hyperlipidemia, unspecified: Secondary | ICD-10-CM

## 2024-01-25 DIAGNOSIS — I249 Acute ischemic heart disease, unspecified: Secondary | ICD-10-CM | POA: Diagnosis not present

## 2024-01-25 DIAGNOSIS — I6523 Occlusion and stenosis of bilateral carotid arteries: Secondary | ICD-10-CM

## 2024-01-25 DIAGNOSIS — N182 Chronic kidney disease, stage 2 (mild): Secondary | ICD-10-CM | POA: Diagnosis not present

## 2024-01-25 DIAGNOSIS — I471 Supraventricular tachycardia, unspecified: Secondary | ICD-10-CM

## 2024-01-25 MED ORDER — LOSARTAN POTASSIUM 25 MG PO TABS: 12.5000 mg | ORAL_TABLET | Freq: Every day | ORAL | 3 refills | Status: AC

## 2024-01-25 NOTE — Patient Instructions (Signed)
 Medication Instructions:  STOP Aspirin   START Losartan  12.5 mg once daily at bedtime  HOLD Metoprolol  Tartrate (Lopressor ) for 1 WEEK- let us  know if this helps with fatigue  *If you need a refill on your cardiac medications before your next appointment, please call your pharmacy*  Lab Work: To be completed in 1 week: lipid panel, CMP, CBC, reflux TSH, lipoprotein-A  If you have labs (blood work) drawn today and your tests are completely normal, you will receive your results only by: MyChart Message (if you have MyChart) OR A paper copy in the mail If you have any lab test that is abnormal or we need to change your treatment, we will call you to review the results.  Testing/Procedures: None ordered today.  Follow-Up: At [facility], you and your health needs are our priority.  As part of our continuing mission to provide you with exceptional heart care, our providers are all part of one team.  This team includes your primary Cardiologist (physician) and Advanced Practice Providers or APPs (Physician Assistants and Nurse Practitioners) who all work together to provide you with the care you need, when you need it.  Your next appointment:   6 month(s)  Provider:   One of our Advanced Practice Providers (APPs): Morse Clause, PA-C  Lamarr Satterfield, NP Miriam Shams, NP  Olivia Pavy, PA-C Josefa Beauvais, NP  Leontine Salen, PA-C Orren Fabry, PA-C  [place], PA-C Ernest Dick, NP  Damien Braver, NP Jon Hails, PA-C  Waddell Donath, PA-C    Dayna Dunn, PA-C  Scott Weaver, PA-C Lum Louis, NP Katlyn West, NP Callie Goodrich, PA-C  Xika Zhao, NP Sheng Haley, PA-C    Kathleen Johnson, PA-C

## 2024-02-06 ENCOUNTER — Encounter: Payer: Self-pay | Admitting: Internal Medicine

## 2024-02-07 LAB — CBC WITH DIFFERENTIAL/PLATELET

## 2024-02-08 ENCOUNTER — Ambulatory Visit: Payer: Self-pay | Admitting: Internal Medicine

## 2024-02-08 LAB — CBC WITH DIFFERENTIAL/PLATELET
Basos: 1 %
EOS (ABSOLUTE): 0 x10E3/uL (ref 0.0–0.2)
Eos: 2 %
Hematocrit: 45.4 % (ref 37.5–51.0)
Hemoglobin: 14.8 g/dL (ref 13.0–17.7)
Immature Granulocytes: 0 %
Immature Granulocytes: 0 x10E3/uL (ref 0.0–0.1)
Lymphs: 33 %
MCH: 31 pg (ref 26.6–33.0)
MCHC: 32.6 g/dL (ref 31.5–35.7)
MCV: 95 fL (ref 79–97)
Monocytes Absolute: 0.1 x10E3/uL (ref 0.0–0.4)
Monocytes Absolute: 0.4 x10E3/uL (ref 0.1–0.9)
Monocytes: 8 %
Neutrophils Absolute: 1.8 x10E3/uL (ref 0.7–3.1)
Neutrophils Absolute: 3.1 x10E3/uL (ref 1.4–7.0)
Neutrophils: 56 %
Platelets: 167 x10E3/uL (ref 150–450)
RBC: 4.77 x10E6/uL (ref 4.14–5.80)
RDW: 12 % (ref 11.6–15.4)
WBC: 5.4 x10E3/uL (ref 3.4–10.8)

## 2024-02-08 LAB — T4F: T4,Free (Direct): 1.2 ng/dL (ref 0.82–1.77)

## 2024-02-08 LAB — COMPREHENSIVE METABOLIC PANEL WITH GFR
ALT: 30 IU/L (ref 0–44)
AST: 37 IU/L (ref 0–40)
Albumin: 4.8 g/dL (ref 3.8–4.8)
Alkaline Phosphatase: 68 IU/L (ref 47–123)
BUN/Creatinine Ratio: 14 (ref 10–24)
BUN: 15 mg/dL (ref 8–27)
Bilirubin Total: 0.9 mg/dL (ref 0.0–1.2)
CO2: 22 mmol/L (ref 20–29)
Calcium: 9.5 mg/dL (ref 8.6–10.2)
Chloride: 102 mmol/L (ref 96–106)
Creatinine, Ser: 1.11 mg/dL (ref 0.76–1.27)
Globulin, Total: 2.7 g/dL (ref 1.5–4.5)
Glucose: 106 mg/dL — ABNORMAL HIGH (ref 70–99)
Potassium: 4.3 mmol/L (ref 3.5–5.2)
Sodium: 140 mmol/L (ref 134–144)
Total Protein: 7.5 g/dL (ref 6.0–8.5)
eGFR: 69 mL/min/1.73

## 2024-02-08 LAB — LIPID PANEL
Chol/HDL Ratio: 2.5 ratio (ref 0.0–5.0)
Cholesterol, Total: 138 mg/dL (ref 100–199)
HDL: 55 mg/dL
LDL Chol Calc (NIH): 69 mg/dL (ref 0–99)
Triglycerides: 70 mg/dL (ref 0–149)
VLDL Cholesterol Cal: 14 mg/dL (ref 5–40)

## 2024-02-08 LAB — TSH RFX ON ABNORMAL TO FREE T4: TSH: 4.71 u[IU]/mL — AB (ref 0.450–4.500)

## 2024-02-08 LAB — LIPOPROTEIN A (LPA): Lipoprotein (a): <8.4 nmol/L

## 2024-02-14 ENCOUNTER — Telehealth: Payer: Self-pay | Admitting: Internal Medicine

## 2024-02-14 NOTE — Telephone Encounter (Signed)
" °*  STAT* If patient is at the pharmacy, call can be transferred to refill team.   1. Which medications need to be refilled? (please list name of each medication and dose if known) empagliflozin  (JARDIANCE ) 10 MG TABS tablet    2. Would you like to learn more about the convenience, safety, & potential cost savings by using the [facility]?      3. Are you open to using the [facility] (Type [facility].  ).   4. Which pharmacy/location (including street and city if local pharmacy) is medication to be sent to?  [id number] - [place], [place] - [address] [place] [place]     5. Do they need a 30 day or 90 day supply? 90 day  "

## 2024-02-15 MED ORDER — EMPAGLIFLOZIN 10 MG PO TABS: 10.0000 mg | ORAL_TABLET | Freq: Every day | ORAL | 3 refills | Status: AC

## 2024-02-15 NOTE — Telephone Encounter (Signed)
 Refill sent
